# Patient Record
Sex: Male | Born: 1980 | Race: White | Hispanic: No | Marital: Single | Smoking: Current every day smoker
Health system: Southern US, Community
[De-identification: ages and names within clinical notes are randomized; demographics above are authoritative.]

## PROBLEM LIST (undated history)

## (undated) DIAGNOSIS — F1010 Alcohol abuse, uncomplicated: Secondary | ICD-10-CM

## (undated) DIAGNOSIS — F419 Anxiety disorder, unspecified: Secondary | ICD-10-CM

## (undated) DIAGNOSIS — E876 Hypokalemia: Secondary | ICD-10-CM

## (undated) DIAGNOSIS — I10 Essential (primary) hypertension: Secondary | ICD-10-CM

---

## 2013-04-04 ENCOUNTER — Ambulatory Visit: Payer: Self-pay | Admitting: General Surgery

## 2013-04-19 ENCOUNTER — Encounter: Payer: Self-pay | Admitting: *Deleted

## 2015-03-31 DIAGNOSIS — K644 Residual hemorrhoidal skin tags: Secondary | ICD-10-CM | POA: Insufficient documentation

## 2019-08-07 ENCOUNTER — Other Ambulatory Visit: Payer: Self-pay

## 2019-08-07 ENCOUNTER — Inpatient Hospital Stay
Admission: EM | Admit: 2019-08-07 | Discharge: 2019-08-09 | DRG: 093 | Disposition: A | Discharge status: Home / Self Care | Payer: BC Managed Care – PPO | Attending: Internal Medicine | Admitting: Internal Medicine

## 2019-08-07 ENCOUNTER — Emergency Department: Payer: BC Managed Care – PPO

## 2019-08-07 ENCOUNTER — Encounter: Payer: Self-pay | Admitting: *Deleted

## 2019-08-07 DIAGNOSIS — K7010 Alcoholic hepatitis without ascites: Secondary | ICD-10-CM | POA: Diagnosis present

## 2019-08-07 DIAGNOSIS — Z23 Encounter for immunization: Secondary | ICD-10-CM

## 2019-08-07 DIAGNOSIS — Z823 Family history of stroke: Secondary | ICD-10-CM

## 2019-08-07 DIAGNOSIS — R296 Repeated falls: Secondary | ICD-10-CM | POA: Diagnosis present

## 2019-08-07 DIAGNOSIS — Z807 Family history of other malignant neoplasms of lymphoid, hematopoietic and related tissues: Secondary | ICD-10-CM

## 2019-08-07 DIAGNOSIS — R748 Abnormal levels of other serum enzymes: Secondary | ICD-10-CM

## 2019-08-07 DIAGNOSIS — G723 Periodic paralysis: Secondary | ICD-10-CM | POA: Diagnosis not present

## 2019-08-07 DIAGNOSIS — Z20828 Contact with and (suspected) exposure to other viral communicable diseases: Secondary | ICD-10-CM | POA: Diagnosis present

## 2019-08-07 DIAGNOSIS — R531 Weakness: Secondary | ICD-10-CM

## 2019-08-07 DIAGNOSIS — F1020 Alcohol dependence, uncomplicated: Secondary | ICD-10-CM | POA: Diagnosis present

## 2019-08-07 DIAGNOSIS — F17210 Nicotine dependence, cigarettes, uncomplicated: Secondary | ICD-10-CM | POA: Diagnosis present

## 2019-08-07 DIAGNOSIS — I10 Essential (primary) hypertension: Secondary | ICD-10-CM | POA: Diagnosis present

## 2019-08-07 DIAGNOSIS — M5126 Other intervertebral disc displacement, lumbar region: Secondary | ICD-10-CM | POA: Diagnosis present

## 2019-08-07 DIAGNOSIS — M4802 Spinal stenosis, cervical region: Secondary | ICD-10-CM | POA: Diagnosis present

## 2019-08-07 DIAGNOSIS — K760 Fatty (change of) liver, not elsewhere classified: Secondary | ICD-10-CM | POA: Diagnosis present

## 2019-08-07 DIAGNOSIS — M48061 Spinal stenosis, lumbar region without neurogenic claudication: Secondary | ICD-10-CM | POA: Diagnosis present

## 2019-08-07 NOTE — ED Notes (Signed)
Pt states he is unable to walk after playing pickle ball 3 days ago.  Pt able to move legs and lift them without diff.  No headache  No back pain. Pt alert speech clear.  Denies injury to legs.  Pt reports when standing to walk, his legs give out.  Pt drinks etoh everyday.  cig smoker.  Rash to left lower abd.  No itching.  md at bedside on arrival

## 2019-08-07 NOTE — ED Provider Notes (Signed)
[facility] Emergency Department Provider Note   ____________________________________________   First MD Initiated Contact with Patient 08/07/19 2210     (approximate)  I have reviewed the triage vital signs and the nursing notes.   HISTORY  Chief Complaint Weakness    HPI Melvin Knight is a 38 y.o. male who reports that on Sunday he was playing pickleball which apparently is tennis on a half court then he sat down had a cocktail and then got up was leaving the club and in the parking lot his legs just collapsed and he could not walk any longer.  Friend of his got him in the car he was able to drive home but he has not been able to walk since then.  He has been sitting in a chair.  He called EMS today.  And came in.  He denies any fever chills back pain neck pain headache.  On testing he denies any numbness.  His legs are not weak.  He just says he cannot stand.  EMS backs him up with this.         No past medical history on file.  There are no active problems to display for this patient.    Prior to Admission medications   Not on File    Allergies Patient has no known allergies.  No family history on file.  Social History Social History   Tobacco Use  . Smoking status: Current Every Day Smoker  . Smokeless tobacco: Current User  Substance Use Topics  . Alcohol use: Yes  . Drug use: Not Currently    Review of Systems  Constitutional: No fever/chills Eyes: No visual changes. ENT: No sore throat. Cardiovascular: Denies chest pain. Respiratory: Denies shortness of breath. Gastrointestinal: No abdominal pain.  No nausea, no vomiting.  No diarrhea.  No constipation. Genitourinary: Negative for dysuria. Musculoskeletal: Negative for back pain. Skin: Negative for rash. Neurological: Negative for headaches ____________________________________________   PHYSICAL EXAM:  VITAL SIGNS: ED Triage Vitals  Enc Vitals Group     BP  08/07/19 2204 (!) 162/118     Pulse Rate 08/07/19 2204 98     Resp 08/07/19 2204 18     Temp 08/07/19 2204 99.3 F (37.4 C)     Temp Source 08/07/19 2204 Oral     SpO2 08/07/19 2204 99 %     Weight --      Height --      Head Circumference --      Peak Flow --      Pain Score 08/07/19 2206 0     Pain Loc --      Pain Edu? --      Excl. in [place]? --     Constitutional: Alert and oriented. Well appearing and in no acute distress. Eyes: Conjunctivae are normal.  PERRLA, EOMI Head: Atraumatic. Nose: No congestion/rhinnorhea. Mouth/Throat: Mucous membranes are moist.  Oropharynx non-erythematous. Neck: No stridor.   Cardiovascular: Normal rate, regular rhythm. Grossly normal heart sounds.  Good peripheral circulation. Respiratory: Normal respiratory effort.  No retractions. Lungs CTAB. Gastrointestinal: Soft and nontender. No distention. No abdominal bruits. No CVA tenderness. Musculoskeletal: No lower extremity tenderness nor edema.   Neurologic:  Normal speech and language. No gross focal neurologic deficits are appreciated.  Cranial nerves II through XII are intact of the visual fields were not checked.  Motor strength in the arms is 5/5 throughout patient denies any numbness motor strength in the legs is also 5/5  throughout and patient denies any numbness there as well.  Patient insists he cannot stand. Skin:  Skin is warm, dry and intact.  He has a slight rash in the lower abdomen on the left side.  It is red there are some 2 to 3 mm confluent papules.   ____________________________________________   LABS (all labs ordered are listed, but only abnormal results are displayed)  Labs Reviewed  CBC WITH DIFFERENTIAL/PLATELET  URINALYSIS, COMPLETE (UACMP) WITH MICROSCOPIC  CBC WITH DIFFERENTIAL/PLATELET  COMPREHENSIVE METABOLIC PANEL  TROPONIN I (HIGH SENSITIVITY)   ____________________________________________  EKG EKG read interpreted by me shows sinus tachycardia rate of 102  normal axis nonspecific ST-T wave changes  ____________________________________________  RADIOLOGY  ED MD interpretation:    Official radiology report(s): No results found.  ____________________________________________   PROCEDURES  Procedure(s) performed (including Critical Care):  Procedures   ____________________________________________   INITIAL IMPRESSION / ASSESSMENT AND PLAN / ED COURSE    Tomasita MorrowRobert Briles was evaluated in Emergency Department on 08/07/2019 for the symptoms described in the history of present illness. He was evaluated in the context of the global COVID-19 pandemic, which necessitated consideration that the patient might be at risk for infection with the SARS-CoV-2 virus that causes COVID-19. Institutional protocols and algorithms that pertain to the evaluation of patients at risk for COVID-19 are in a state of rapid change based on information released by regulatory bodies including the CDC and federal and state organizations. These policies and algorithms were followed during the patient's care in the ED.   ----------------------------------------- 11:38 PM on 08/07/2019 -----------------------------------------   Patient is still an MRI at this point.  I am signing him out to Dr. Lenard LancePaduchowski      ____________________________________________   FINAL CLINICAL IMPRESSION(S) / ED DIAGNOSES  Final diagnoses:  Weakness     ED Discharge Orders    None       Note:  This document was prepared using Dragon voice recognition software and may include unintentional dictation errors.    Arnaldo NatalMalinda, Solimar Maiden F, MD 08/07/19 [phone]

## 2019-08-07 NOTE — ED Notes (Signed)
Report off to vanessa rn 

## 2019-08-07 NOTE — ED Triage Notes (Signed)
Pt brought in via ems from home with weakness.  Pt reports unable to ambulate after playing pickle ball 3 days ago.  No known injury.  Pt alert  Speech clear.

## 2019-08-07 NOTE — ED Notes (Signed)
Pt placed on monitor by this writer, EKG completed and given to MD Malinda.

## 2019-08-08 ENCOUNTER — Inpatient Hospital Stay: Payer: BC Managed Care – PPO

## 2019-08-08 ENCOUNTER — Other Ambulatory Visit: Payer: Self-pay

## 2019-08-08 ENCOUNTER — Encounter: Payer: Self-pay | Admitting: *Deleted

## 2019-08-08 DIAGNOSIS — K760 Fatty (change of) liver, not elsewhere classified: Secondary | ICD-10-CM | POA: Diagnosis present

## 2019-08-08 DIAGNOSIS — F1020 Alcohol dependence, uncomplicated: Secondary | ICD-10-CM | POA: Diagnosis present

## 2019-08-08 DIAGNOSIS — M5126 Other intervertebral disc displacement, lumbar region: Secondary | ICD-10-CM | POA: Diagnosis present

## 2019-08-08 DIAGNOSIS — G723 Periodic paralysis: Secondary | ICD-10-CM | POA: Diagnosis present

## 2019-08-08 DIAGNOSIS — M4802 Spinal stenosis, cervical region: Secondary | ICD-10-CM | POA: Diagnosis present

## 2019-08-08 DIAGNOSIS — Z823 Family history of stroke: Secondary | ICD-10-CM | POA: Diagnosis not present

## 2019-08-08 DIAGNOSIS — M48061 Spinal stenosis, lumbar region without neurogenic claudication: Secondary | ICD-10-CM | POA: Diagnosis present

## 2019-08-08 DIAGNOSIS — F17210 Nicotine dependence, cigarettes, uncomplicated: Secondary | ICD-10-CM | POA: Diagnosis present

## 2019-08-08 DIAGNOSIS — Z23 Encounter for immunization: Secondary | ICD-10-CM | POA: Diagnosis not present

## 2019-08-08 DIAGNOSIS — K7010 Alcoholic hepatitis without ascites: Secondary | ICD-10-CM | POA: Diagnosis present

## 2019-08-08 DIAGNOSIS — Z20828 Contact with and (suspected) exposure to other viral communicable diseases: Secondary | ICD-10-CM | POA: Diagnosis present

## 2019-08-08 DIAGNOSIS — I10 Essential (primary) hypertension: Secondary | ICD-10-CM | POA: Diagnosis present

## 2019-08-08 DIAGNOSIS — Z807 Family history of other malignant neoplasms of lymphoid, hematopoietic and related tissues: Secondary | ICD-10-CM | POA: Diagnosis not present

## 2019-08-08 DIAGNOSIS — R296 Repeated falls: Secondary | ICD-10-CM | POA: Diagnosis present

## 2019-08-08 LAB — COMPREHENSIVE METABOLIC PANEL
ALT: 74 U/L — ABNORMAL HIGH (ref 0–44)
AST: 128 U/L — ABNORMAL HIGH (ref 15–41)
Albumin: 4 g/dL (ref 3.5–5.0)
Alkaline Phosphatase: 93 U/L (ref 38–126)
Anion gap: 12 (ref 5–15)
BUN: 8 mg/dL (ref 6–20)
CO2: 28 mmol/L (ref 22–32)
Calcium: 8.7 mg/dL — ABNORMAL LOW (ref 8.9–10.3)
Chloride: 97 mmol/L — ABNORMAL LOW (ref 98–111)
Creatinine, Ser: 0.65 mg/dL (ref 0.61–1.24)
GFR calc Af Amer: >60 mL/min (ref >60)
GFR calc non Af Amer: >60 mL/min (ref >60)
Glucose, Bld: 136 mg/dL — ABNORMAL HIGH (ref 70–99)
Potassium: <2 mmol/L — CL (ref 3.5–5.1)
Sodium: 137 mmol/L (ref 135–145)
Total Bilirubin: 2.2 mg/dL — ABNORMAL HIGH (ref 0.3–1.2)
Total Protein: 7.2 g/dL (ref 6.5–8.1)

## 2019-08-08 LAB — URINALYSIS, COMPLETE (UACMP) WITH MICROSCOPIC
Bacteria, UA: NONE SEEN
Bilirubin Urine: NEGATIVE
Glucose, UA: NEGATIVE mg/dL
Ketones, ur: NEGATIVE mg/dL
Leukocytes,Ua: NEGATIVE
Nitrite: NEGATIVE
Protein, ur: NEGATIVE mg/dL
Specific Gravity, Urine: 1.006 (ref 1.005–1.030)
Squamous Epithelial / HPF: NONE SEEN (ref 0–5)
pH: 7 (ref 5.0–8.0)

## 2019-08-08 LAB — CBC WITH DIFFERENTIAL/PLATELET
Abs Immature Granulocytes: 0.02 10*3/uL (ref 0.00–0.07)
Basophils Absolute: 0.1 10*3/uL (ref 0.0–0.1)
Basophils Relative: 1 %
Eosinophils Absolute: 0.2 10*3/uL (ref 0.0–0.5)
Eosinophils Relative: 2 %
HCT: 39.8 % (ref 39.0–52.0)
Hemoglobin: 15.1 g/dL (ref 13.0–17.0)
Immature Granulocytes: 0 %
Lymphocytes Relative: 27 %
Lymphs Abs: 2.5 10*3/uL (ref 0.7–4.0)
MCH: 36.4 pg — ABNORMAL HIGH (ref 26.0–34.0)
MCHC: 37.9 g/dL — ABNORMAL HIGH (ref 30.0–36.0)
MCV: 95.9 fL (ref 80.0–100.0)
Monocytes Absolute: 0.8 10*3/uL (ref 0.1–1.0)
Monocytes Relative: 9 %
Neutro Abs: 5.4 10*3/uL (ref 1.7–7.7)
Neutrophils Relative %: 61 %
Platelets: 226 10*3/uL (ref 150–400)
RBC: 4.15 MIL/uL — ABNORMAL LOW (ref 4.22–5.81)
RDW: 13.4 % (ref 11.5–15.5)
WBC: 9 10*3/uL (ref 4.0–10.5)
nRBC: 0 % (ref 0.0–0.2)

## 2019-08-08 LAB — HEPATIC FUNCTION PANEL
ALT: 70 U/L — ABNORMAL HIGH (ref 0–44)
AST: 114 U/L — ABNORMAL HIGH (ref 15–41)
Albumin: 3.9 g/dL (ref 3.5–5.0)
Alkaline Phosphatase: 91 U/L (ref 38–126)
Bilirubin, Direct: 0.7 mg/dL — ABNORMAL HIGH (ref 0.0–0.2)
Indirect Bilirubin: 1.7 mg/dL — ABNORMAL HIGH (ref 0.3–0.9)
Total Bilirubin: 2.4 mg/dL — ABNORMAL HIGH (ref 0.3–1.2)
Total Protein: 7 g/dL (ref 6.5–8.1)

## 2019-08-08 LAB — CBC
HCT: 38.5 % — ABNORMAL LOW (ref 39.0–52.0)
Hemoglobin: 14.6 g/dL (ref 13.0–17.0)
MCH: 36.6 pg — ABNORMAL HIGH (ref 26.0–34.0)
MCHC: 37.9 g/dL — ABNORMAL HIGH (ref 30.0–36.0)
MCV: 96.5 fL (ref 80.0–100.0)
Platelets: 226 10*3/uL (ref 150–400)
RBC: 3.99 MIL/uL — ABNORMAL LOW (ref 4.22–5.81)
RDW: 13.3 % (ref 11.5–15.5)
WBC: 8.8 10*3/uL (ref 4.0–10.5)
nRBC: 0 % (ref 0.0–0.2)

## 2019-08-08 LAB — TROPONIN I (HIGH SENSITIVITY)
Troponin I (High Sensitivity): 11 ng/L (ref <18)
Troponin I (High Sensitivity): 13 ng/L (ref <18)

## 2019-08-08 LAB — SARS CORONAVIRUS 2 (TAT 6-24 HRS): SARS Coronavirus 2: NEGATIVE

## 2019-08-08 LAB — SEDIMENTATION RATE: Sed Rate: 9 mm/hr (ref 0–15)

## 2019-08-08 LAB — BASIC METABOLIC PANEL
Anion gap: 12 (ref 5–15)
BUN: 8 mg/dL (ref 6–20)
CO2: 26 mmol/L (ref 22–32)
Calcium: 8.4 mg/dL — ABNORMAL LOW (ref 8.9–10.3)
Chloride: 101 mmol/L (ref 98–111)
Creatinine, Ser: 0.67 mg/dL (ref 0.61–1.24)
GFR calc Af Amer: >60 mL/min (ref >60)
GFR calc non Af Amer: >60 mL/min (ref >60)
Glucose, Bld: 138 mg/dL — ABNORMAL HIGH (ref 70–99)
Potassium: 2.5 mmol/L — CL (ref 3.5–5.1)
Sodium: 139 mmol/L (ref 135–145)

## 2019-08-08 LAB — MAGNESIUM: Magnesium: 1.9 mg/dL (ref 1.7–2.4)

## 2019-08-08 LAB — CKMB (ARMC ONLY): CK, MB: 4.7 ng/mL (ref 0.5–5.0)

## 2019-08-08 LAB — POTASSIUM: Potassium: 3 mmol/L — ABNORMAL LOW (ref 3.5–5.1)

## 2019-08-08 LAB — CK: Total CK: 838 U/L — ABNORMAL HIGH (ref 49–397)

## 2019-08-08 LAB — TSH: TSH: 1.559 u[IU]/mL (ref 0.350–4.500)

## 2019-08-08 MED ORDER — LABETALOL HCL 5 MG/ML IV SOLN
20.0000 mg | INTRAVENOUS | Status: DC | PRN
  Administered 2019-08-08 (×2): 20 mg via INTRAVENOUS
  Filled 2019-08-08 (×2): qty 4

## 2019-08-08 MED ORDER — THIAMINE HCL 100 MG/ML IJ SOLN
Freq: Once | INTRAVENOUS | Status: AC
  Administered 2019-08-08: 04:00:00 via INTRAVENOUS
  Filled 2019-08-08: qty 1000

## 2019-08-08 MED ORDER — AMLODIPINE BESYLATE 5 MG PO TABS
5.0000 mg | ORAL_TABLET | Freq: Every day | ORAL | Status: DC
  Administered 2019-08-08 – 2019-08-09 (×2): 5 mg via ORAL
  Filled 2019-08-08 (×2): qty 1

## 2019-08-08 MED ORDER — TRAZODONE HCL 50 MG PO TABS: 25.0000 mg | ORAL_TABLET | Freq: Every evening | ORAL | Status: DC | PRN

## 2019-08-08 MED ORDER — MAGNESIUM SULFATE IN D5W 1-5 GM/100ML-% IV SOLN
1.0000 g | Freq: Once | INTRAVENOUS | Status: AC
  Administered 2019-08-08: 1 g via INTRAVENOUS
  Filled 2019-08-08: qty 100

## 2019-08-08 MED ORDER — ACETAMINOPHEN 325 MG PO TABS: 650.0000 mg | ORAL_TABLET | Freq: Four times a day (QID) | ORAL | Status: DC | PRN

## 2019-08-08 MED ORDER — POTASSIUM CHLORIDE IN NACL 40-0.9 MEQ/L-% IV SOLN
INTRAVENOUS | Status: DC
  Administered 2019-08-08 – 2019-08-09 (×3): 100 mL/h via INTRAVENOUS
  Filled 2019-08-08 (×5): qty 1000

## 2019-08-08 MED ORDER — ONDANSETRON HCL 4 MG/2ML IJ SOLN: 4.0000 mg | Freq: Four times a day (QID) | INTRAMUSCULAR | Status: DC | PRN

## 2019-08-08 MED ORDER — ONDANSETRON HCL 4 MG PO TABS: 4.0000 mg | ORAL_TABLET | Freq: Four times a day (QID) | ORAL | Status: DC | PRN

## 2019-08-08 MED ORDER — LORAZEPAM 2 MG/ML IJ SOLN: 1.0000 mg | INTRAMUSCULAR | Status: DC | PRN

## 2019-08-08 MED ORDER — ACETAMINOPHEN 650 MG RE SUPP: 650.0000 mg | Freq: Four times a day (QID) | RECTAL | Status: DC | PRN

## 2019-08-08 MED ORDER — POTASSIUM CHLORIDE CRYS ER 20 MEQ PO TBCR
40.0000 meq | EXTENDED_RELEASE_TABLET | Freq: Once | ORAL | Status: AC
  Administered 2019-08-08: 01:00:00 40 meq via ORAL
  Filled 2019-08-08: qty 2

## 2019-08-08 MED ORDER — POTASSIUM CHLORIDE 20 MEQ PO PACK
40.0000 meq | PACK | Freq: Once | ORAL | Status: AC
  Administered 2019-08-08: 02:00:00 40 meq via ORAL
  Filled 2019-08-08: qty 2

## 2019-08-08 MED ORDER — NICOTINE 14 MG/24HR TD PT24: 14.0000 mg | MEDICATED_PATCH | Freq: Every day | TRANSDERMAL | Status: DC | PRN

## 2019-08-08 MED ORDER — POTASSIUM CHLORIDE CRYS ER 20 MEQ PO TBCR
40.0000 meq | EXTENDED_RELEASE_TABLET | Freq: Once | ORAL | Status: AC
  Administered 2019-08-08: 40 meq via ORAL
  Filled 2019-08-08: qty 2

## 2019-08-08 MED ORDER — POTASSIUM CHLORIDE 20 MEQ PO PACK
40.0000 meq | PACK | ORAL | Status: AC
  Administered 2019-08-08 (×3): 40 meq via ORAL
  Filled 2019-08-08 (×3): qty 2

## 2019-08-08 MED ORDER — INFLUENZA VAC SPLIT QUAD 0.5 ML IM SUSY
0.5000 mL | PREFILLED_SYRINGE | INTRAMUSCULAR | Status: AC
  Administered 2019-08-09: 0.5 mL via INTRAMUSCULAR
  Filled 2019-08-08: qty 0.5

## 2019-08-08 MED ORDER — MAGNESIUM HYDROXIDE 400 MG/5ML PO SUSP: 30.0000 mL | Freq: Every day | ORAL | Status: DC | PRN

## 2019-08-08 MED ORDER — POTASSIUM CHLORIDE 10 MEQ/100ML IV SOLN
10.0000 meq | Freq: Once | INTRAVENOUS | Status: AC
  Administered 2019-08-08: 10 meq via INTRAVENOUS
  Filled 2019-08-08: qty 100

## 2019-08-08 MED ORDER — ENOXAPARIN SODIUM 40 MG/0.4ML ~~LOC~~ SOLN
40.0000 mg | SUBCUTANEOUS | Status: DC
  Administered 2019-08-08 – 2019-08-09 (×2): 40 mg via SUBCUTANEOUS
  Filled 2019-08-08 (×2): qty 0.4

## 2019-08-08 NOTE — Progress Notes (Signed)
CRITICAL VALUE STICKER  CRITICAL VALUE: potassium 2.5  MD NOTIFIED:  Eugenie Norrie, MD  RESPONSE: orders for 90mEq of po potassium q2hr x 3 doses.  Conley Simmonds, RN, BSN

## 2019-08-08 NOTE — H&P (Signed)
[facility] [place] [facility]   PATIENT NAME: Melvin Knight    MR#:  [id number]  DATE OF BIRTH:  02-Sep-1981  DATE OF ADMISSION:  08/07/2019  PRIMARY CARE PHYSICIAN: Patient, No Pcp Per   REQUESTING/REFERRING PHYSICIAN: Minna Antis, MD CHIEF COMPLAINT:   Chief Complaint  Patient presents with  . Weakness    HISTORY OF PRESENT ILLNESS:  Melvin Knight  is a 38 y.o. Caucasian male with a known history of ongoing tobacco and EtOH abuse, who presented to the emergency room with acute onset of bilateral lower extremity weakness with subsequent recurrent falls.  The patient was playing pickle ball a couple days ago during the evening for about 3 hours and apparently did not realize exhausted himself more than usual.  He had a cocktail drank and then walked 100 yard distance prior to falling.  He was then helped to his car and fell again before getting into his car.  He was able to drive home.  At home he has been trying to use his hands and upper body to move but has not been able to ambulate.  He has therefore been feeling sore in his both arms and to lesser extent in his legs.  No other myalgia or arthralgia.  He stated that it has been in the same without improvement or deterioration over the last couple days and therefore he came to the ER.  Upon presentation here his blood pressure was 162/118 with a temperature of 99.3 with otherwise normal vital signs.  Labs were remarkable for hypokalemia of 2 and hypochloremia of 97.  AST use 128 and ALT 74.  He had a normal brain MRI and C-spine MRI showing cervical disc bulging and endplate spurring affecting the neural foramina and up to moderate foraminal stenosis at the right C7 nerve level with mild mostly left-sided foraminal stenosis elsewhere.  T and LS-spine MRI showed mild lower lumbar spine degeneration at L4-L5 and L5-S1 with borderline to mild multifactorial spinal stenosis, with small central disc protrusion at  the later without associated stenosis mild lumbar epidural lipomatosis and several small benign vertebral hemangiomas.  The patient was given  40 mEq p.o. and 10 mEq of IV potassium chloride.  He will be admitted to a medically monitored bed. PAST MEDICAL HISTORY:  Ongoing tobacco abuse and alcohol abuse  PAST SURGICAL HISTORY:  Denies any previous surgeries  SOCIAL HISTORY:   Social History   Tobacco Use  . Smoking status: Current Every Day Smoker  . Smokeless tobacco: Current User  Substance Use Topics  . Alcohol use: Yes  He smokes less than a half a pack of cigarettes per day and drinks 3 cocktails drinks per day.  FAMILY HISTORY:  Positive for CVA in his father and non-Hodgkin's lymphoma in his sister.  DRUG ALLERGIES:  No Known Allergies  REVIEW OF SYSTEMS:   ROS As per history of present illness. All pertinent systems were reviewed above. Constitutional,  HEENT, cardiovascular, respiratory, GI, GU, musculoskeletal, neuro, psychiatric, endocrine,  integumentary and hematologic systems were reviewed and are otherwise  negative/unremarkable except for positive findings mentioned above in the HPI.   MEDICATIONS AT HOME:   Prior to Admission medications   Not on File      VITAL SIGNS:  Blood pressure (!) 150/108, pulse 78, temperature 99.3 F (37.4 C), temperature source Oral, resp. rate (!) 22, SpO2 97 %.  PHYSICAL EXAMINATION:  Physical Exam  GENERAL:  38 y.o.-year-old Caucasian male patient lying  in the bed with no acute distress.  EYES: Pupils equal, round, reactive to light and accommodation. No scleral icterus. Extraocular muscles intact.  HEENT: Head atraumatic, normocephalic. Oropharynx and nasopharynx clear.  NECK:  Supple, no jugular venous distention. No thyroid enlargement, no tenderness.  LUNGS: Normal breath sounds bilaterally, no wheezing, rales,rhonchi or crepitation. No use of accessory muscles of respiration.  CARDIOVASCULAR: Regular rate and  rhythm, S1, S2 normal. No murmurs, rubs, or gallops.  ABDOMEN: Soft, nondistended, nontender. Bowel sounds present. No organomegaly or mass.  EXTREMITIES: No pedal edema, cyanosis, or clubbing.  NEUROLOGIC: Cranial nerves II through XII are intact. Muscle strength 5/5 in all extremities. Sensation intact. Gait not checked.  No pronator drift.  No facial droop. PSYCHIATRIC: The patient is alert and oriented x 3.  Normal affect and good eye contact. SKIN: No obvious rash, lesion, or ulcer.   LABORATORY PANEL:   CBC Recent Labs  Lab 08/08/19 0009  WBC 9.0  HGB 15.1  HCT 39.8  PLT 226   ------------------------------------------------------------------------------------------------------------------  Chemistries  Recent Labs  Lab 08/08/19 0009  NA 137  K <2.0*  CL 97*  CO2 28  GLUCOSE 136*  BUN 8  CREATININE 0.65  CALCIUM 8.7*  AST 128*  ALT 74*  ALKPHOS 93  BILITOT 2.2*   ------------------------------------------------------------------------------------------------------------------  Cardiac Enzymes No results for input(s): TROPONINI in the last 168 hours. ------------------------------------------------------------------------------------------------------------------  RADIOLOGY:  Mr Brain 13 Contrast  Result Date: 08/07/2019 CLINICAL DATA:  38 year old male with acute onset weakness, unable to stand, ambulate. EXAM: MRI HEAD WITHOUT CONTRAST TECHNIQUE: Multiplanar, multiecho pulse sequences of the brain and surrounding structures were obtained without intravenous contrast. COMPARISON:  None. FINDINGS: Brain: No restricted diffusion to suggest acute infarction. No midline shift, mass effect, evidence of mass lesion, ventriculomegaly, extra-axial collection or acute intracranial hemorrhage. Cervicomedullary junction and pituitary are within normal limits. Small cavum septum pellucidum, normal variant. Pearline Cables and white matter signal is within normal limits throughout the  brain. No encephalomalacia or chronic blood products identified. Vascular: Major intracranial vascular flow voids are preserved. Skull and upper cervical spine: Negative visible cervical spine, also reported separately today. Visualized bone marrow signal is within normal limits. Sinuses/Orbits: Negative orbits. Trace paranasal sinus mucosal thickening. Other: Mastoids are clear. Visible internal auditory structures appear normal. Scalp and face soft tissues appear negative. IMPRESSION: Normal noncontrast Normal MRI appearance of the brain. Electronically Signed   By: Genevie Ann M.D.   On: 08/07/2019 23:54   Mr Cervical Spine Wo Contrast  Result Date: 08/07/2019 CLINICAL DATA:  38 year old male with acute onset weakness, unable to stand, ambulate. EXAM: MRI CERVICAL SPINE WITHOUT CONTRAST TECHNIQUE: Multiplanar, multisequence MR imaging of the cervical spine was performed. No intravenous contrast was administered. COMPARISON:  Brain MRI today reported separately. FINDINGS: Alignment: Mild straightening of cervical lordosis. Vertebrae: No marrow edema or evidence of acute osseous abnormality. Visualized bone marrow signal is within normal limits. Cord: Normal spinal cord signal and morphology at all visible levels. Fairly capacious cervical spinal canal. Posterior Fossa, vertebral arteries, paraspinal tissues: Preserved major vascular flow voids in the neck. Codominant vertebral arteries. Cervicomedullary junction is within normal limits. Negative visible posterior fossa. Negative visible neck soft tissues and lung apices. Disc levels: C2-C3: Mild left foraminal endplate spurring and facet hypertrophy. Mild left C3 foraminal stenosis. C3-C4: Mild mostly foraminal disc bulging and endplate spurring. Mild left C4 foraminal stenosis. C4-C5: Mild foraminal disc bulging and endplate spurring with borderline to mild left C5 foraminal stenosis. C5-C6:  Negative. C6-C7: Mild foraminal disc bulging and endplate spurring  greater on the right. Mild to moderate right and mild left C7 foraminal stenosis. C7-T1:  Negative. Negative visible upper thoracic levels. IMPRESSION: 1. No cervical spinal stenosis and normal visible spinal cord. 2. Cervical disc bulging and endplate spurring affecting the neural foramina. Up to moderate foraminal stenosis at the right C7 nerve level. Mild mostly left side foraminal stenosis elsewhere. Electronically Signed   By: Odessa FlemingH  Hall M.D.   On: 08/07/2019 23:58   Mr Thoracic Spine Wo Contrast  Result Date: 08/08/2019 CLINICAL DATA:  38 year old male with acute onset weakness, unable to stand, ambulate. EXAM: MRI THORACIC SPINE WITHOUT CONTRAST TECHNIQUE: Multiplanar, multisequence MR imaging of the thoracic spine was performed. No intravenous contrast was administered. COMPARISON:  Cervical spine MRI today reported separately. FINDINGS: Limited cervical spine imaging:  Reported separately today. Thoracic spine segmentation:  Appears to be normal. Alignment:  Preserved thoracic kyphosis.  No spondylolisthesis. Vertebrae: No marrow edema or evidence of acute osseous abnormality. Mild degenerative thoracic endplate marrow signal changes, such as at T9 inferiorly on the left. Normal background bone marrow signal. Cord: Normal thoracic spinal cord signal and morphology to the conus medullaris which is at L1. Fairly capacious thoracic spinal canal and thecal sac. Paraspinal and other soft tissues: Negative. Disc levels: T1-T2: Negative. T2-T3: Negative. T3-T4: Negative. T4-T5: Negative. T5-T6: Tiny central disc protrusion most apparent on series 19, image 9. No spinal stenosis and no foraminal involvement. T6-T7: Negative. T7-T8: Small central to the left paracentral and slightly caudal disc protrusion best seen on series 22, image 24. No spinal stenosis and no foraminal involvement. T8-T9: Negative. T9-T10: Negative. T10-T11: Mild facet hypertrophy.  No stenosis. T11-T12: Minimal disc bulge. Mild facet  hypertrophy. No significant stenosis. T12-L1: Negative. IMPRESSION: 1. Largely normal for age MRI appearance of the thoracic spine. No spinal stenosis or spinal cord abnormality. 2. Tiny disc herniations at T5-T6 and T7-T8 without associated stenosis. Electronically Signed   By: Odessa FlemingH  Hall M.D.   On: 08/08/2019 00:08   Mr Lumbar Spine Wo Contrast  Result Date: 08/08/2019 CLINICAL DATA:  38 year old male with acute onset weakness, unable to stand, ambulate. EXAM: MRI LUMBAR SPINE WITHOUT CONTRAST TECHNIQUE: Multiplanar, multisequence MR imaging of the lumbar spine was performed. No intravenous contrast was administered. COMPARISON:  Thoracic spine MRI today reported separately. FINDINGS: Segmentation:  Normal. Alignment:  Preserved lumbar lordosis.  No spondylolisthesis. Vertebrae: Small benign sacral vertebral body hemangiomas are suspected at S1 and S2. Similar small left side L5 hemangioma. Background bone marrow signal is within normal limits. No marrow edema or evidence of acute osseous abnormality. Intact visible sacrum and SI joints. Conus medullaris and cauda equina: Conus extends to the L1 level. No lower spinal cord or conus signal abnormality. The cauda equina nerve roots appear normal. Paraspinal and other soft tissues: Negative. Disc levels: L1-L2:  Negative aside from mild epidural lipomatosis.  No stenosis. L2-L3:  Negative. L3-L4:  Negative. L4-L5: Mild epidural lipomatosis. Mild disc desiccation and disc bulging. Mild posterior element hypertrophy. Borderline to mild spinal stenosis. No convincing lateral recess or foraminal stenosis. L5-S1: Disc desiccation and disc space loss. Small central disc protrusion with annular fissure. Superimposed epidural lipomatosis, no spinal or lateral recess stenosis. Mild facet hypertrophy. No foraminal stenosis. IMPRESSION: 1. Mild lower lumbar spine degeneration at L4-L5 and L5-S1. Borderline to mild multifactorial spinal stenosis at the former. Small central  disc protrusion at the latter without associated stenosis. 2. Mild lumbar epidural lipomatosis.  3. No acute osseous abnormality. Several small benign vertebral hemangiomas. Electronically Signed   By: Odessa FlemingH  Hall M.D.   On: 08/08/2019 00:15      IMPRESSION AND PLAN:   1.  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 -The patient will be admitted to a medically monitored bed. -We will aggressively replace his potassium and obtain a magnesium level. -He will be hydrated with IV normal saline. -Differential diagnoses would include myositis and rhabdomyolysis. -Will obtain a CK and MB level.  2.  Ongoing alcohol abuse. -The patient will be placed on a banana bag daily and PRN IV Ativan. -No current signs of withdrawal. -I counseled him for significantly cutting down his alcohol intake.  3.  Elevated LFTs. -This likely related to alcoholic hepatitis. -Will obtain viral hepatitis markers.  4.  Elevated blood pressure. -This could be associated with new onset hypertension especially with history of alcohol abuse. - He will be placed on PRN IV labetalol. - We will add p.o. Norvasc.  5.  Ongoing tobacco abuse. -The patient was counseled for smoking cessation and he will receive further counseling here.  6.  DVT prophylaxis. -Subcutaneous Lovenox.  All the records are reviewed and case discussed with ED provider. The plan of care was discussed in details with the patient (and family). I answered all questions. The patient agreed to proceed with the above mentioned plan. Further management will depend upon hospital course.   CODE STATUS: Full code  TOTAL TIME TAKING CARE OF THIS PATIENT: 50 minutes.    Hannah BeatJan A Chrisandra Wiemers M.D on 08/08/2019 at 1:48 AM  Pager - [phone]  After 6pm go to [email or web address] - Social research officer, governmentpassword [email or web address] [email or web address]  [facility]  [facility]  Office  [phone]  CC: Primary care physician; Patient, No Pcp Per   Note: This dictation was prepared with Dragon dictation along with smaller phrase technology. Any transcriptional errors that result from this process are unintentional.

## 2019-08-08 NOTE — ED Provider Notes (Signed)
-----------------------------------------   1:03 AM on 08/08/2019 -----------------------------------------  Patient's repeat potassium remains critically low.  This very likely could be a case of hypokalemic periodic paralysis.  MRI scans are negative.  We will begin potassium repletion.  Patient will be admitted to the hospital service for further treatment.   Harvest Dark, MD 08/08/19 [phone]

## 2019-08-08 NOTE — Progress Notes (Signed)
Patient admitted early this morning.  Seen and examined by me later in the morning.  Please see H&P for further details.  Patient states he is feeling little bit better this morning.  He has no concerns today.  He is asking when he can go home.  On exam, he is well-appearing.  RRR, normal work of breathing on room air.  Generalized weakness-likely secondary to severe hypokalemia.  Potassium is improving with repletion. -Continue replacing potassium -Check K at 1 PM -Will give a dose of magnesium 1 g IV -PT eval  Mild rhabdomyolysis- CK 838 on admission.  Likely due to playing pickle ball for 3 hours without drinking any water. -Continue IV fluids for today -Recheck CK in the morning  Alcohol abuse-patient states he drinks about 6 drinks of liquor per day -Continue CIWA -Alcohol cessation counseling performed this admission  Elevated liver transaminases/hyperbilirubinemia-likely secondary to alcoholic hepatitis. -Hepatitis panel pending -RUQ ultrasound with hepatic steatosis and no gallbladder abnormalities. -Recheck CMP in the morning  Hypertension-BP improving -Norvasc started this admission -Continue IV labetalol as needed  Tobacco abuse -Nicotine patch as needed  Patient can likely be discharged home tomorrow.  Hyman Bible, MD

## 2019-08-08 NOTE — ED Notes (Signed)
.. ED TO INPATIENT HANDOFF REPORT  ED Nurse Name and Phone #: Delton Coombes  S Name/Age/Gender Melvin Knight 38 y.o. male Room/Bed: ED02A/ED02A  Code Status   Code Status: Full Code  Home/SNF/Other Home Patient oriented to: self, place, time and situation Is this baseline? Yes   Triage Complete: Triage complete  Chief Complaint Weakness  Triage Note Pt brought in via ems from home with weakness.  Pt reports unable to ambulate after playing pickle ball 3 days ago.  No known injury.  Pt alert  Speech clear.     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   A IV Location/Drains/Wounds Patient Lines/Drains/Airways Status   Active Line/Drains/Airways    Name:   Placement date:   Placement time:   Site:   Days:   Peripheral IV 08/07/19 Right Antecubital   08/07/19    2218    Antecubital   1          Intake/Output Last 24 hours No intake or output data in the 24 hours ending 08/08/19 0125  Labs/Imaging Results for orders placed or performed during the hospital encounter of 08/07/19 (from the past 48 hour(s))  Comprehensive metabolic panel     Status: Abnormal   Collection Time: 08/08/19 12:09 AM  Result Value Ref Range   Sodium 137 135 - 145 mmol/L   Potassium <2.0 (LL) 3.5 - 5.1 mmol/L    Comment:  CRITICAL RESULT CALLED TO, READ BACK BY AND VERIFIED WITH Sheriff Rodenberg ON 08/08/19 AT 0046 [facility]    Chloride 97 (L) 98 - 111 mmol/L   CO2 28 22 - 32 mmol/L   Glucose, Bld 136 (H) 70 - 99 mg/dL   BUN 8 6 - 20 mg/dL   Creatinine, Ser 0.65 0.61 - 1.24 mg/dL   Calcium 8.7 (L) 8.9 - 10.3 mg/dL   Total Protein 7.2 6.5 - 8.1 g/dL   Albumin 4.0 3.5 - 5.0 g/dL   AST 128 (H) 15 - 41 U/L   ALT 74 (H) 0 - 44 U/L   Alkaline Phosphatase 93 38 - 126 U/L   Total Bilirubin 2.2 (H) 0.3 - 1.2 mg/dL   GFR calc non Af Amer >60 >60 mL/min   GFR calc Af Amer >60 >60 mL/min   Anion gap 12 5 - 15    Comment: Performed at [facility], [place], [place] [address]  Troponin I (High Sensitivity)     Status: None   Collection Time: 08/08/19 12:09 AM  Result Value Ref Range   Troponin I (High Sensitivity) 13 <18 ng/L    Comment: (NOTE) Elevated high sensitivity troponin I (hsTnI) values and significant  changes across serial measurements may suggest ACS but many other  chronic and acute conditions are known to elevate hsTnI results.  Refer to the "Links" section for chest pain  algorithms and additional  guidance. Performed at [facility], [address] [place], [place] [id number]    Mr Brain 13Wo Contrast  Result Date: 08/07/2019 CLINICAL DATA:  38 year old male with acute onset weakness, unable to stand, ambulate. EXAM: MRI HEAD WITHOUT CONTRAST TECHNIQUE: Multiplanar, multiecho pulse sequences of the brain and surrounding structures were obtained without intravenous contrast. COMPARISON:  None. FINDINGS: Brain: No restricted diffusion to suggest acute infarction. No midline shift, mass effect, evidence of mass lesion, ventriculomegaly, extra-axial collection or acute intracranial hemorrhage. Cervicomedullary junction and pituitary are within normal limits. Small cavum septum pellucidum, normal variant. Wallace CullensGray and white matter signal is within normal limits throughout the brain. No  encephalomalacia or chronic blood products identified. Vascular: Major intracranial vascular flow voids are preserved. Skull and upper cervical spine: Negative visible cervical spine, also reported separately today. Visualized bone marrow signal is within normal limits. Sinuses/Orbits: Negative orbits. Trace paranasal sinus mucosal thickening. Other: Mastoids are clear. Visible internal auditory structures appear normal. Scalp and face soft tissues appear negative. IMPRESSION: Normal noncontrast Normal MRI appearance of the brain. Electronically Signed   By: Odessa FlemingH  Hall M.D.   On: 08/07/2019 23:54   Mr Cervical Spine Wo Contrast  Result Date: 08/07/2019 CLINICAL DATA:  38 year old male with acute onset weakness, unable to stand, ambulate. EXAM: MRI CERVICAL SPINE WITHOUT CONTRAST TECHNIQUE: Multiplanar, multisequence MR imaging of the cervical spine was performed. No intravenous contrast was administered. COMPARISON:  Brain MRI today reported separately. FINDINGS: Alignment: Mild straightening of cervical lordosis. Vertebrae: No marrow edema or evidence of acute osseous abnormality. Visualized bone marrow signal is within normal limits. Cord: Normal spinal cord signal and morphology at all visible levels. Fairly capacious cervical spinal canal. Posterior Fossa, vertebral arteries, paraspinal tissues: Preserved major vascular flow voids in the neck. Codominant vertebral arteries. Cervicomedullary junction is within normal limits. Negative visible posterior fossa. Negative visible neck soft tissues and lung apices. Disc levels: C2-C3: Mild left foraminal endplate spurring and facet hypertrophy. Mild left C3 foraminal stenosis. C3-C4: Mild mostly foraminal disc bulging and endplate spurring. Mild left C4 foraminal stenosis. C4-C5: Mild foraminal disc bulging and endplate spurring with borderline to mild left C5 foraminal stenosis. C5-C6:  Negative. C6-C7: Mild foraminal disc bulging and endplate spurring greater on the  right. Mild to moderate right and mild left C7 foraminal stenosis. C7-T1:  Negative. Negative visible upper thoracic levels. IMPRESSION: 1. No cervical spinal stenosis and normal visible spinal cord. 2. Cervical disc bulging and endplate spurring affecting the neural foramina. Up to moderate foraminal stenosis at the right C7 nerve level. Mild mostly left side foraminal stenosis elsewhere. Electronically Signed   By: Odessa FlemingH  Hall M.D.   On: 08/07/2019 23:58   Mr Thoracic Spine Wo Contrast  Result Date: 08/08/2019 CLINICAL DATA:  38 year old male with acute onset weakness, unable to stand, ambulate. EXAM: MRI THORACIC SPINE WITHOUT CONTRAST TECHNIQUE: Multiplanar, multisequence MR imaging of the thoracic spine was performed. No intravenous contrast was administered. COMPARISON:  Cervical spine MRI today reported separately. FINDINGS: Limited cervical spine imaging:  Reported separately today. Thoracic spine segmentation:  Appears to be normal. Alignment:  Preserved thoracic kyphosis.  No spondylolisthesis. Vertebrae: No marrow edema or evidence of acute osseous abnormality. Mild degenerative thoracic endplate marrow signal changes, such as at T9 inferiorly on the left. Normal background bone marrow signal. Cord: Normal thoracic spinal cord signal and morphology to the conus medullaris which is at L1. Fairly capacious thoracic spinal canal and thecal sac. Paraspinal and other  soft tissues: Negative. Disc levels: T1-T2: Negative. T2-T3: Negative. T3-T4: Negative. T4-T5: Negative. T5-T6: Tiny central disc protrusion most apparent on series 19, image 9. No spinal stenosis and no foraminal involvement. T6-T7: Negative. T7-T8: Small central to the left paracentral and slightly caudal disc protrusion best seen on series 22, image 24. No spinal stenosis and no foraminal involvement. T8-T9: Negative. T9-T10: Negative. T10-T11: Mild facet hypertrophy.  No stenosis. T11-T12: Minimal disc bulge. Mild facet hypertrophy. No  significant stenosis. T12-L1: Negative. IMPRESSION: 1. Largely normal for age MRI appearance of the thoracic spine. No spinal stenosis or spinal cord abnormality. 2. Tiny disc herniations at T5-T6 and T7-T8 without associated stenosis. Electronically Signed   By: Odessa Fleming M.D.   On: 08/08/2019 00:08   Mr Lumbar Spine Wo Contrast  Result Date: 08/08/2019 CLINICAL DATA:  38 year old male with acute onset weakness, unable to stand, ambulate. EXAM: MRI LUMBAR SPINE WITHOUT CONTRAST TECHNIQUE: Multiplanar, multisequence MR imaging of the lumbar spine was performed. No intravenous contrast was administered. COMPARISON:  Thoracic spine MRI today reported separately. FINDINGS: Segmentation:  Normal. Alignment:  Preserved lumbar lordosis.  No spondylolisthesis. Vertebrae: Small benign sacral vertebral body hemangiomas are suspected at S1 and S2. Similar small left side L5 hemangioma. Background bone marrow signal is within normal limits. No marrow edema or evidence of acute osseous abnormality. Intact visible sacrum and SI joints. Conus medullaris and cauda equina: Conus extends to the L1 level. No lower spinal cord or conus signal abnormality. The cauda equina nerve roots appear normal. Paraspinal and other soft tissues: Negative. Disc levels: L1-L2:  Negative aside from mild epidural lipomatosis.  No stenosis. L2-L3:  Negative. L3-L4:  Negative. L4-L5: Mild epidural lipomatosis. Mild disc desiccation and disc bulging. Mild posterior element hypertrophy. Borderline to mild spinal stenosis. No convincing lateral recess or foraminal stenosis. L5-S1: Disc desiccation and disc space loss. Small central disc protrusion with annular fissure. Superimposed epidural lipomatosis, no spinal or lateral recess stenosis. Mild facet hypertrophy. No foraminal stenosis. IMPRESSION: 1. Mild lower lumbar spine degeneration at L4-L5 and L5-S1. Borderline to mild multifactorial spinal stenosis at the former. Small central disc protrusion at  the latter without associated stenosis. 2. Mild lumbar epidural lipomatosis. 3. No acute osseous abnormality. Several small benign vertebral hemangiomas. Electronically Signed   By: Odessa Fleming M.D.   On: 08/08/2019 00:15    Pending Labs Unresulted Labs (From admission, onward)    Start     Ordered   08/08/19 0500  Basic metabolic panel  Tomorrow morning,   STAT     08/08/19 0121   08/08/19 0500  CBC  Tomorrow morning,   STAT     08/08/19 0121   08/08/19 0123  Magnesium  Add-on,   AD    Comments: Follow-up was in 2: Give 2 g of IV mag sulfate    08/08/19 0122   08/08/19 0122  HIV antibody (Routine Testing)  Once,   STAT     08/08/19 0121   08/08/19 0058  SARS CORONAVIRUS 2 (TAT 6-24 HRS) Nasopharyngeal Nasopharyngeal Swab  (Asymptomatic/Tier 2 Patients Labs)  Once,   STAT    Question Answer Comment  Is this test for diagnosis or screening Screening   Symptomatic for COVID-19 as defined by CDC No   Hospitalized for COVID-19 No   Admitted to ICU for COVID-19 No   Previously tested for COVID-19 No   Resident in a congregate (group) care setting No   Employed in healthcare setting No  08/08/19 0057   08/08/19 0053  TSH  Add-on,   AD     08/08/19 0053   08/07/19 2320  CBC with Differential/Platelet  Once,   STAT     08/07/19 2320   08/07/19 2211  CBC with Differential  Once,   STAT     08/07/19 2213   08/07/19 2211  Urinalysis, Complete w Microscopic  ONCE - STAT,   STAT     08/07/19 2213          Vitals/Pain Today's Vitals   08/07/19 2204 08/07/19 2206 08/07/19 2230  BP: (!) 162/118  (!) 170/119  Pulse: 98  95  Resp: 18  18  Temp: 99.3 F (37.4 C)    TempSrc: Oral    SpO2: 99%  96%  PainSc:  0-No pain     Isolation Precautions No active isolations  Medications Medications  potassium chloride 10 mEq in 100 mL IVPB (10 mEq Intravenous New Bag/Given 08/08/19 0109)  enoxaparin (LOVENOX) injection 40 mg (has no administration in time range)  0.9 % NaCl with KCl 40  mEq / L  infusion (has no administration in time range)  acetaminophen (TYLENOL) tablet 650 mg (has no administration in time range)    Or  acetaminophen (TYLENOL) suppository 650 mg (has no administration in time range)  traZODone (DESYREL) tablet 25 mg (has no administration in time range)  ondansetron (ZOFRAN) tablet 4 mg (has no administration in time range)    Or  ondansetron (ZOFRAN) injection 4 mg (has no administration in time range)  magnesium hydroxide (MILK OF MAGNESIA) suspension 30 mL (has no administration in time range)  potassium chloride (KLOR-CON) packet 40 mEq (has no administration in time range)  potassium chloride SA (K-DUR) CR tablet 40 mEq (40 mEq Oral Given 08/08/19 0057)    Mobility non-ambulatory Moderate fall risk   Focused Assessments Neuro Assessment Handoff:  Swallow screen pass? Yes          Neuro Assessment:   Neuro Checks:      Last Documented NIHSS Modified Score:   Has TPA been given? No If patient is a Neuro Trauma and patient is going to OR before floor call report to 4N Charge nurse: [phone] or [phone]     R Recommendations: See Admitting Provider Note  Report given to:   Additional Notes:

## 2019-08-08 NOTE — Evaluation (Signed)
Physical Therapy Evaluation Patient Details Name: Melvin Knight MRN: [id number] DOB: 08/31/1981 Today's Date: 08/08/2019   History of Present Illness  Pt is a 38 year old male admitted for hypokalemia following collapse and paralysis at home.  No medical hx on file.  Clinical Impression  Pt is a 38 year old male who lives in a one story home alone.  He is independent without an AD and works full time at baseline.  Pt able to perform bed mobility mod I with no difficulty sitting at EOB.  Pt with full strength in ankles and knee but still very limited with hip flexion and UE mobility.  Pt able to stand from bedside appearing slightly unsteady but without need for UE support.  He presented with balance deficits during functional dynamic tasks and 40 ft of ambulation in room.  He felt more comfortable with use of RW during ambulation and was able to manage appropriately.  PT reviewed there ex which will help pt with UE mobility and pt was able to complete all with manual assistance.  Pt reported pain at IV site following shoulder depression there ex and RN notified.  Pt will continue to benefit from skilled PT with focus on strength, tolerance to activity and safe functional mobility.    Follow Up Recommendations Home health PT;Supervision - Intermittent    Equipment Recommendations  Rolling walker with 5" wheels;3in1 (PT)    Recommendations for Other Services       Precautions / Restrictions Precautions Precautions: Fall      Mobility  Bed Mobility Overal bed mobility: Modified Independent             General bed mobility comments: Increased time  Transfers Overall transfer level: Needs assistance Equipment used: Rolling walker (2 wheeled) Transfers: Sit to/from Stand Sit to Stand: Min guard         General transfer comment: Able to stand without use of UE's or RW.  Able to stand with good time and slightly unsteady once standing without symptoms of  hypotension.  Ambulation/Gait Ambulation/Gait assistance: Min guard Gait Distance (Feet): 40 Feet Assistive device: Rolling walker (2 wheeled)     Gait velocity interpretation: <1.8 ft/sec, indicate of risk for recurrent falls General Gait Details: Fair foot clearance, step length, able to manage RW but does not use for physical support most of time.  Stated that he feels more comfortable with it at this time.  Stairs            Wheelchair Mobility    Modified Rankin (Stroke Patients Only)       Balance Overall balance assessment: Needs assistance Sitting-balance support: Feet supported Sitting balance-Leahy Scale: Good     Standing balance support: Bilateral upper extremity supported Standing balance-Leahy Scale: Fair Standing balance comment: Able to stand without RW, more steady and confident with RW during dynamic tasks. Single Leg Stance - Right Leg: 7 Single Leg Stance - Left Leg: 7 Tandem Stance - Right Leg: 10 Tandem Stance - Left Leg: 10 Rhomberg - Eyes Opened: 10 Rhomberg - Eyes Closed: 10   High Level Balance Comments: Able to perform standing march with full ROM, unable to bend to lift object from floor at this time.             Pertinent Vitals/Pain Pain Assessment: No/denies pain    Home Living Family/patient expects to be discharged to:: Private residence Living Arrangements: Alone Available Help at Discharge: Family;Available PRN/intermittently Type of Home: House Home Access: Stairs to  enter Entrance Stairs-Rails: None(Very small steps, can hold to door.) Entrance Stairs-Number of Steps: 2 Home Layout: One level Home Equipment: None      Prior Function Level of Independence: Independent         Comments: Works full time.     Hand Dominance        Extremity/Trunk Assessment   Upper Extremity Assessment Upper Extremity Assessment: Generalized weakness(Shoulder AROM: 30 degrees bilaterally, PROM: 110 degrees with guarding.   Grip 4-/5 bilaterally.)    Lower Extremity Assessment Lower Extremity Assessment: Overall WFL for tasks assessed(Ankle DF/PF, knee ext/flex: 5/5 bilaterally, Hip flexion: 3-/5 bilaterally.)    Cervical / Trunk Assessment Cervical / Trunk Assessment: Normal  Communication   Communication: No difficulties  Cognition Arousal/Alertness: Awake/alert Behavior During Therapy: WFL for tasks assessed/performed Overall Cognitive Status: Within Functional Limits for tasks assessed                                 General Comments: Eager to work with therapy and follows directions consistently.      General Comments      Exercises Other Exercises Other Exercises: Shoulder PROM x10 BUE, shoulder AROM x10 BUE, shoulder depression x10 in recliner.   Assessment/Plan    PT Assessment Patient needs continued PT services  PT Problem List Decreased strength;Decreased mobility;Decreased activity tolerance;Decreased balance;Decreased knowledge of use of DME;Decreased coordination       PT Treatment Interventions DME instruction;Therapeutic activities;Gait training;Therapeutic exercise;Patient/family education;Stair training;Balance training;Functional mobility training;Neuromuscular re-education    PT Goals (Current goals can be found in the Care Plan section)  Acute Rehab PT Goals Patient Stated Goal: To return to work PT Goal Formulation: With patient Time For Goal Achievement: 08/22/19 Potential to Achieve Goals: Good    Frequency Min 2X/week   Barriers to discharge        Co-evaluation               AM-PAC PT "6 Clicks" Mobility  Outcome Measure Help needed turning from your back to your side while in a flat bed without using bedrails?: A Little Help needed moving from lying on your back to sitting on the side of a flat bed without using bedrails?: A Little Help needed moving to and from a bed to a chair (including a wheelchair)?: A Little Help needed standing  up from a chair using your arms (e.g., wheelchair or bedside chair)?: A Little Help needed to walk in hospital room?: A Little Help needed climbing 3-5 steps with a railing? : A Lot 6 Click Score: 17    End of Session Equipment Utilized During Treatment: Gait belt Activity Tolerance: Patient tolerated treatment well;Patient limited by fatigue Patient left: in chair;with call bell/phone within reach Nurse Communication: Mobility status(Pain at site of IV reported following shoulder depression exercises.) PT Visit Diagnosis: Unsteadiness on feet (R26.81);Difficulty in walking, not elsewhere classified (R26.2);Muscle weakness (generalized) (M62.81)    Time: [id number] PT Time Calculation (min) (ACUTE ONLY): 28 min   Charges:   PT Evaluation $PT Eval Low Complexity: 1 Low PT Treatments $Therapeutic Activity: 8-22 mins        Glenetta HewSarah Vladislav Axelson, PT, DPT   Glenetta HewSarah Aldred Mase 08/08/2019, 4:21 PM

## 2019-08-09 LAB — CBC
HCT: 35.4 % — ABNORMAL LOW (ref 39.0–52.0)
Hemoglobin: 12.8 g/dL — ABNORMAL LOW (ref 13.0–17.0)
MCH: 37 pg — ABNORMAL HIGH (ref 26.0–34.0)
MCHC: 36.2 g/dL — ABNORMAL HIGH (ref 30.0–36.0)
MCV: 102.3 fL — ABNORMAL HIGH (ref 80.0–100.0)
Platelets: 192 10*3/uL (ref 150–400)
RBC: 3.46 MIL/uL — ABNORMAL LOW (ref 4.22–5.81)
RDW: 14 % (ref 11.5–15.5)
WBC: 6.4 10*3/uL (ref 4.0–10.5)
nRBC: 0 % (ref 0.0–0.2)

## 2019-08-09 LAB — BASIC METABOLIC PANEL
Anion gap: 8 (ref 5–15)
BUN: <5 mg/dL — ABNORMAL LOW (ref 6–20)
CO2: 27 mmol/L (ref 22–32)
Calcium: 8.3 mg/dL — ABNORMAL LOW (ref 8.9–10.3)
Chloride: 109 mmol/L (ref 98–111)
Creatinine, Ser: 0.56 mg/dL — ABNORMAL LOW (ref 0.61–1.24)
GFR calc Af Amer: >60 mL/min (ref >60)
GFR calc non Af Amer: >60 mL/min (ref >60)
Glucose, Bld: 135 mg/dL — ABNORMAL HIGH (ref 70–99)
Potassium: 3.7 mmol/L (ref 3.5–5.1)
Sodium: 144 mmol/L (ref 135–145)

## 2019-08-09 LAB — CK
Total CK: 1802 U/L — ABNORMAL HIGH (ref 49–397)
Total CK: 2254 U/L — ABNORMAL HIGH (ref 49–397)
Total CK: 2361 U/L — ABNORMAL HIGH (ref 49–397)
Total CK: 2453 U/L — ABNORMAL HIGH (ref 49–397)

## 2019-08-09 LAB — HEPATIC FUNCTION PANEL
ALT: 63 U/L — ABNORMAL HIGH (ref 0–44)
AST: 112 U/L — ABNORMAL HIGH (ref 15–41)
Albumin: 3.5 g/dL (ref 3.5–5.0)
Alkaline Phosphatase: 80 U/L (ref 38–126)
Bilirubin, Direct: 0.3 mg/dL — ABNORMAL HIGH (ref 0.0–0.2)
Indirect Bilirubin: 0.8 mg/dL (ref 0.3–0.9)
Total Bilirubin: 1.1 mg/dL (ref 0.3–1.2)
Total Protein: 6.4 g/dL — ABNORMAL LOW (ref 6.5–8.1)

## 2019-08-09 LAB — HIV ANTIBODY (ROUTINE TESTING W REFLEX): HIV Screen 4th Generation wRfx: NONREACTIVE

## 2019-08-09 LAB — HEPATITIS PANEL, ACUTE
HCV Ab: <0.1 s/co ratio (ref 0.0–0.9)
Hep A IgM: NEGATIVE
Hep B C IgM: NEGATIVE
Hepatitis B Surface Ag: NEGATIVE

## 2019-08-09 MED ORDER — SODIUM CHLORIDE 0.9 % IV BOLUS
1000.0000 mL | Freq: Once | INTRAVENOUS | Status: AC
  Administered 2019-08-09: 13:00:00 1000 mL via INTRAVENOUS

## 2019-08-09 MED ORDER — SODIUM CHLORIDE 0.9 % IV BOLUS
1000.0000 mL | Freq: Once | INTRAVENOUS | Status: AC
  Administered 2019-08-09: 1000 mL via INTRAVENOUS

## 2019-08-09 MED ORDER — AMLODIPINE BESYLATE 5 MG PO TABS: 5.0000 mg | ORAL_TABLET | Freq: Every day | ORAL | 0 refills | Status: DC

## 2019-08-09 NOTE — Progress Notes (Signed)
Patient CK elevated post 2 IVF bolus's.  Dr. Brett Albino aware. Patient plan is to go home today and encouraged to drink lots of fluids in the next few days to stay hydrated.

## 2019-08-09 NOTE — Progress Notes (Signed)
Patient ambulating in room this am without assistance. Patient fall risk due to dehydration and unable to get up prior to admission. Dr. Brett Albino in agreement that patient improved at this time and able to be independent in room.  Plan to be discharged later today.

## 2019-08-09 NOTE — Discharge Instructions (Signed)
It was so nice to meet you during this hospitalization!  You came into the hospital with weakness and we found that your potassium was very low. You also had some muscle injury that caused some toxins to be released into your blood. We gave you fluids and potassium and everything improved.  Your blood pressure was high throughout this whole hospitalization, so I started you on a blood pressure medication called Norvasc. Please take 1 tablet daily. You should follow-up with your primary care doctor.   Take care, Dr. Brett Albino

## 2019-08-09 NOTE — Progress Notes (Signed)
OT Cancellation Note  Patient Details Name: Melvin Knight MRN: [id number] DOB: [birth year]   Cancelled Treatment:    Reason Eval/Treat Not Completed: OT screened, no needs identified, will sign off(OT services are not warranted at this time. Pt. reports that UE impairments initially present upon admission have resolved, and that he is back at his baseline functioning. There are currently no deficits warranting OT intervention.)  Harrel Carina, MS, OTR/L 08/09/2019, 3:55 PM

## 2019-08-09 NOTE — Care Management (Signed)
Per Dr. Brett Albino patient is doing much better today and does not have any home needs.  TOC signing off.

## 2019-08-09 NOTE — Discharge Summary (Signed)
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 No pertinent past medical history. HOSPITAL COURSE:   Melvin MaduroRobert "Melvin Castillandrew" is a 38 year old male who presented to the ED with generalized weakness and recurrent falls.  He had played pickle ball for about 3 hours a couple days before that and has had progressively worsening weakness since then.  In the ED, potassium was <2.0.  He was admitted for further management.  Generalized weakness-likely secondary to severe hypokalemia.  Hypokalemia resolved with repletion. -MRI brain was normal -MRI c-spine showed some cervical disc bulging -MRI t-spine nromal -MRI l-spine some borderline spinal stenosis -PT was consulted and recommended home health PT and a rolling walker, but patient's weakness completely resolved on the day of discharge and patient was able to ambulate around the room without any issues.  Mild rhabdomyolysis- Likely due to playing pickle ball for 3 hours without drinking any water.  -CK initially trended upwards, but plateaued with IV fluids  Alcohol abuse-patient states he drinks about 6 drinks of liquor per day -Alcohol cessation counseling performed this admission  Elevated liver transaminases/hyperbilirubinemia-likely secondary to alcoholic hepatitis. -Acute hepatitis panel was negative -RUQ ultrasound with hepatic steatosis and no gallbladder abnormalities. -Recheck CMP as an outpatient  Hypertension- BP was elevated throughout this admission -Started on Norvasc 5 mg daily -Needs close BP  monitoring as an outpatient  Tobacco abuse -Tobacco cessation counseling performed this admission  DISCHARGE CONDITIONS:  Mild rhabdomyolysis Alcohol abuse Alcoholic hepatitis Hypertension Tobacco abuse CONSULTS OBTAINED:  None DRUG ALLERGIES:  No Known Allergies DISCHARGE MEDICATIONS:   Allergies as of 08/09/2019   No Known Allergies     Medication List    TAKE these medications   amLODipine 5 MG tablet Commonly known as: NORVASC Take 1 tablet (5 mg total) by mouth daily. Start taking on: August 10, 2019        DISCHARGE INSTRUCTIONS:  1.  Follow-up with PCP in 5 days 2.  Take Norvasc 5 mg daily DIET:  Cardiac diet DISCHARGE CONDITION:  Stable ACTIVITY:  Activity as tolerated OXYGEN:  Home Oxygen: No.  Oxygen Delivery: room air DISCHARGE LOCATION:  home   If you experience worsening of your admission symptoms, develop shortness of breath, life threatening emergency, suicidal or homicidal thoughts you must seek medical attention immediately by calling 911 or calling your MD immediately  if symptoms less severe.  You Must read complete instructions/literature along with all the possible adverse reactions/side effects for all the Medicines you take and that have been prescribed to you. Take any new Medicines after you have completely understood and accpet all the possible adverse reactions/side effects.   Please note  You were cared for by a hospitalist during your hospital stay. If you have any questions about your discharge medications or the care you received while you were in the hospital after you are discharged, you can call the unit and asked to speak with the hospitalist on call if the hospitalist that took care of you is not available. Once you are discharged, your primary care physician will handle any  further medical issues. Please note that NO REFILLS for any discharge medications will be authorized once you are discharged, as it is imperative that  you return to your primary care physician (or establish a relationship with a primary care physician if you do not have one) for your aftercare needs so that they can reassess your need for medications and monitor your lab values.    On the day of Discharge:  VITAL SIGNS:  Blood pressure (!) 159/112, pulse 82, temperature 98.9 F (37.2 C), temperature source Oral, resp. rate 19, height 5\' 7"  (1.702 m), weight 93.2 kg, SpO2 99 %. PHYSICAL EXAMINATION:  GENERAL:  38 y.o.-year-old patient, ambulating throughout the room without any issues.  Well-appearing. EYES: Pupils equal, round, reactive to light and accommodation. No scleral icterus. Extraocular muscles intact.  HEENT: Head atraumatic, normocephalic. Oropharynx and nasopharynx clear.  NECK:  Supple, no jugular venous distention. No thyroid enlargement, no tenderness.  LUNGS: Normal breath sounds bilaterally, no wheezing, rales,rhonchi or crepitation. No use of accessory muscles of respiration.  CARDIOVASCULAR: RRR, S1, S2 normal. No murmurs, rubs, or gallops.  ABDOMEN: Soft, non-tender, non-distended. Bowel sounds present. No organomegaly or mass.  EXTREMITIES: No pedal edema, cyanosis, or clubbing.  NEUROLOGIC: Cranial nerves II through XII are intact. Muscle strength 5/5 in all extremities. Sensation intact. Gait not checked.  PSYCHIATRIC: The patient is alert and oriented x 3.  SKIN: No obvious rash, lesion, or ulcer.  DATA REVIEW:   CBC Recent Labs  Lab 08/09/19 0451  WBC 6.4  HGB 12.8*  HCT 35.4*  PLT 192    Chemistries  Recent Labs  Lab 08/08/19 0325  08/09/19 0451  NA 139  --  144  K 2.5*   < > 3.7  CL 101  --  109  CO2 26  --  27  GLUCOSE 138*  --  135*  BUN 8  --  <5*  CREATININE 0.67  --  0.56*  CALCIUM 8.4*  --  8.3*  MG 1.9  --   --   AST 114*  --  112*  ALT 70*  --  63*  ALKPHOS 91  --  80  BILITOT 2.4*  --  1.1   < > = values in this interval not displayed.     Microbiology Results  Results for  orders placed or performed during the hospital encounter of 08/07/19  SARS CORONAVIRUS 2 (TAT 6-24 HRS) Nasopharyngeal Nasopharyngeal Swab     Status: None   Collection Time: 08/08/19  1:00 AM   Specimen: Nasopharyngeal Swab  Result Value Ref Range Status   SARS Coronavirus 2 NEGATIVE NEGATIVE Final    Comment: (NOTE) SARS-CoV-2 target nucleic acids are NOT DETECTED. The SARS-CoV-2 RNA is generally detectable in upper and lower respiratory specimens during the acute phase of infection. Negative results do not preclude SARS-CoV-2 infection, do not rule out co-infections with other pathogens, and should not be used as the sole basis for treatment or other patient management decisions. Negative results must be combined with clinical observations, patient history, and epidemiological information. The expected result is Negative. Fact Sheet for Patients: HairSlick.no Fact Sheet for Healthcare Providers: quierodirigir.com This test is not yet approved or cleared by the [place] FDA and  has been authorized for detection and/or diagnosis of SARS-CoV-2 by FDA under an Emergency Use Authorization (EUA). This EUA will remain  in effect (meaning this test can be used) for the duration of the COVID-19 declaration under Section 56 4(b)(1) of the  Act, 21 U.S.C. section 360bbb-3(b)(1), unless the authorization is terminated or revoked sooner. Performed at [facility], [place] [address], [place], [place] [address]     RADIOLOGY:  No results found.   Management plans discussed with the patient, family and they are in agreement.  CODE STATUS: Full Code   TOTAL TIME TAKING CARE OF THIS PATIENT: 45 minutes.    Berna Spare Mayo M.D on 08/09/2019 at 10:22 AM  Between 7am to 6pm - Pager - [phone]  After 6pm go to [email or web address] - Proofreader  [employer]  Hospitalists  Office  [phone]  CC: Primary care  physician; Patient, No Pcp Per   Note: This dictation was prepared with Dragon dictation along with smaller phrase technology. Any transcriptional errors that result from this process are unintentional.

## 2019-08-09 NOTE — Plan of Care (Signed)
  Problem: Activity: Goal: Risk for activity intolerance will decrease Outcome: Progressing   Problem: Elimination: Goal: Will not experience complications related to bowel motility Outcome: Progressing Goal: Will not experience complications related to urinary retention Outcome: Progressing   Problem: Pain Managment: Goal: General experience of comfort will improve Outcome: Progressing   Problem: Safety: Goal: Ability to remain free from injury will improve Outcome: Progressing   Problem: Skin Integrity: Goal: Risk for impaired skin integrity will decrease Outcome: Progressing   Problem: Education: Goal: Knowledge of General Education information will improve Description: Including pain rating scale, medication(s)/side effects and non-pharmacologic comfort measures Outcome: Completed/Met   Problem: Clinical Measurements: Goal: Respiratory complications will improve Outcome: Completed/Met   Problem: Nutrition: Goal: Adequate nutrition will be maintained Outcome: Completed/Met   Problem: Coping: Goal: Level of anxiety will decrease Outcome: Completed/Met

## 2019-08-09 NOTE — Progress Notes (Signed)
Patient discharged to home. Tele and IV d/c'd prior to discharge. Patient verbalizes understanding of discharge instructions.  Patient received flu vaccine prior to going home.

## 2020-08-25 IMAGING — MR MR THORACIC SPINE W/O CM
6 series · 34 of 48 positions shown · non-contrast
Comparison: Cervical spine MRI today reported separately.

CLINICAL DATA: 37-year-old male with acute onset weakness, unable
to stand, ambulate.

EXAM:
MRI THORACIC SPINE WITHOUT CONTRAST
TECHNIQUE: Multiplanar, multisequence MR imaging of the thoracic spine was
performed. No intravenous contrast was administered.

[Series 18: T1 · sagittal · 6.0mm · 1.41mm/px · 2 of 9 slices shown (1 of 2)]
[im 1/9]
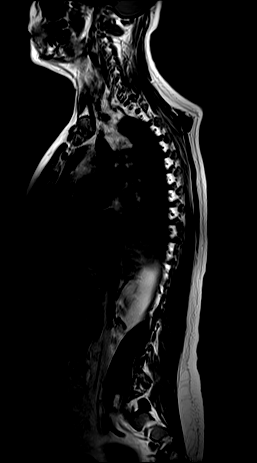
[im 9/9]
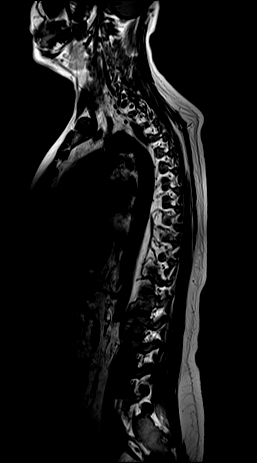

[Series 19: T2 · sagittal · 3.0mm · 1.33mm/px · 6 of 17 slices shown (1 of 2)]
[im 1/17]
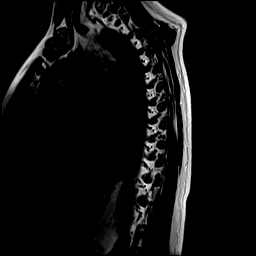
[im 4/17]
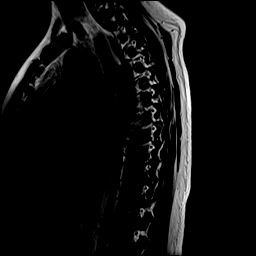
[im 7/17]
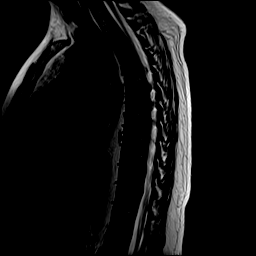
[im 10/17]
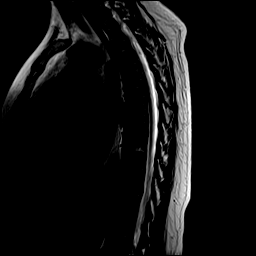
[im 13/17]
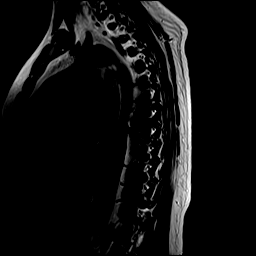
[im 17/17]
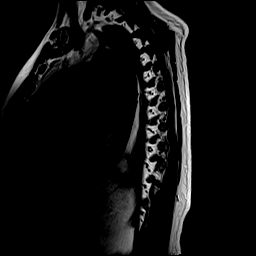

[Series 20: T1 · sagittal · 3.0mm · 1.33mm/px · 6 of 17 slices shown (2 of 2)]
[im 1/17]
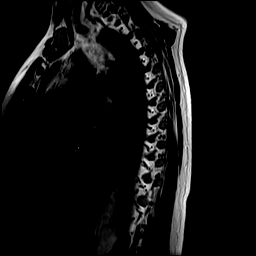
[im 4/17]
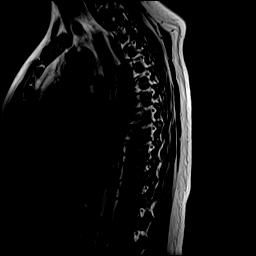
[im 7/17]
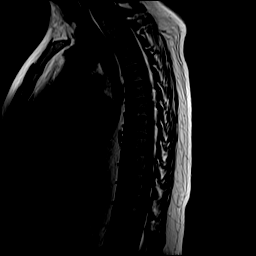
[im 10/17]
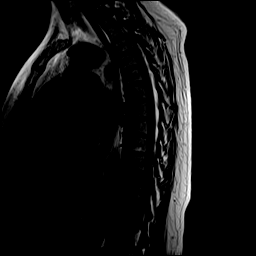
[im 13/17]
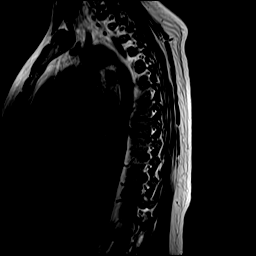
[im 17/17]
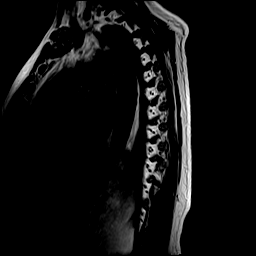

[Series 21: STIR · sagittal · 3.0mm · 0.66mm/px · 6 of 17 slices shown]
[im 1/17]
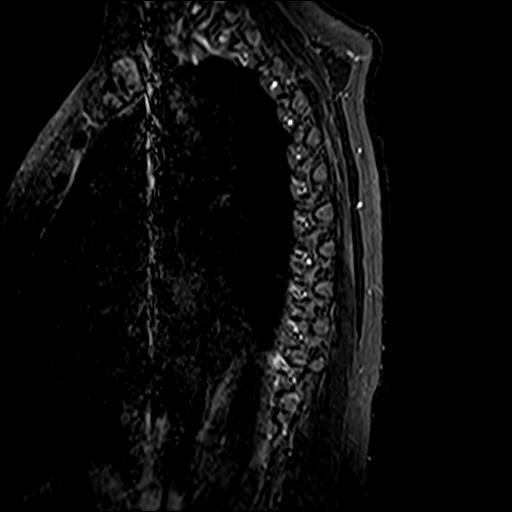
[im 4/17]
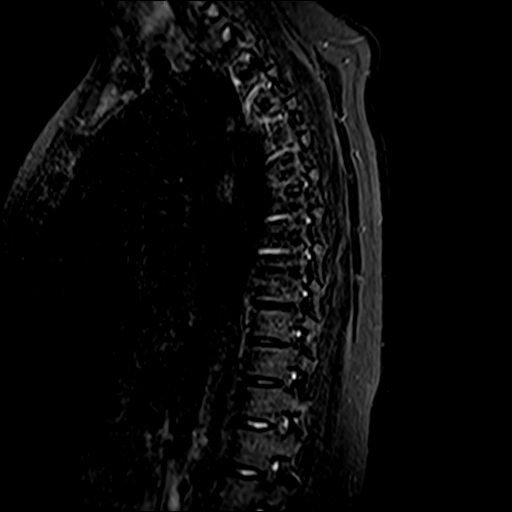
[im 7/17]
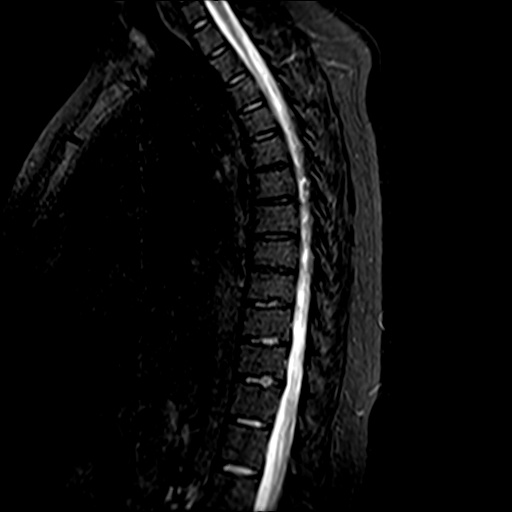
[im 10/17]
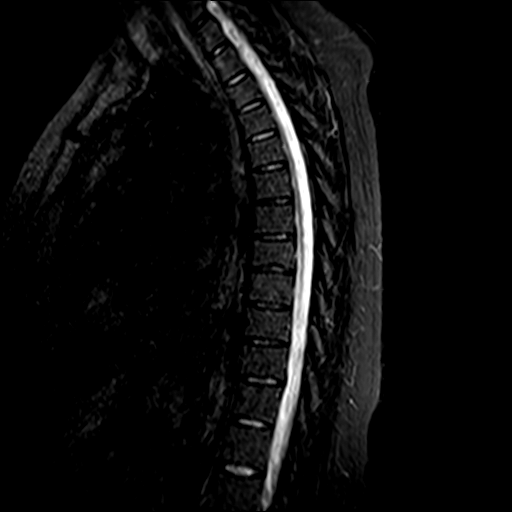
[im 13/17]
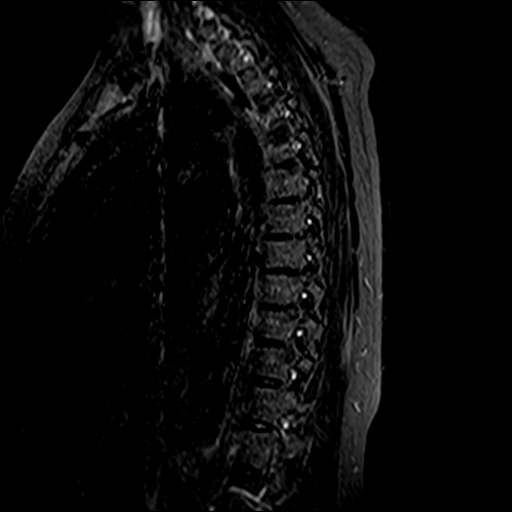
[im 17/17]
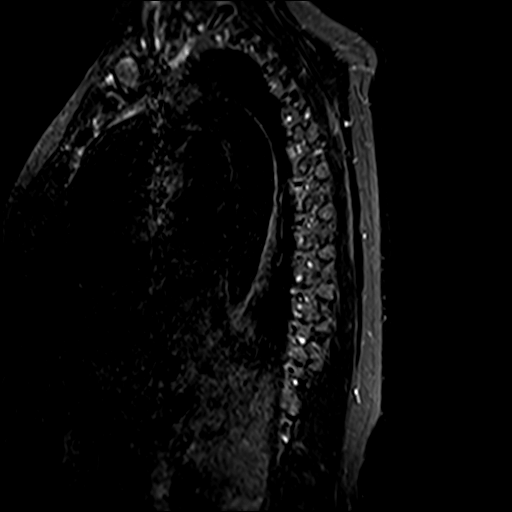

[Series 22: T2 · axial · 4.0mm · 0.74mm/px · z∈[-442,-223]mm · 8 of 39 slices shown (2 of 2)]
[im 1/39]
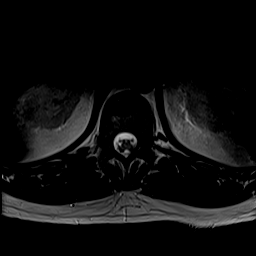
[im 6/39]
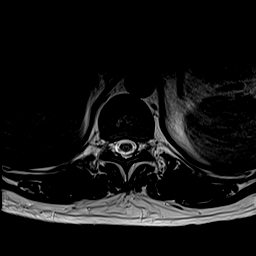
[im 12/39]
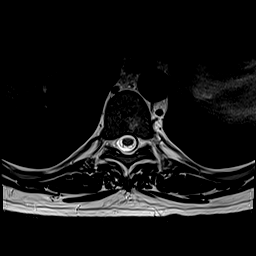
[im 18/39]
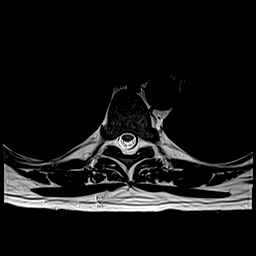
[im 21/39]
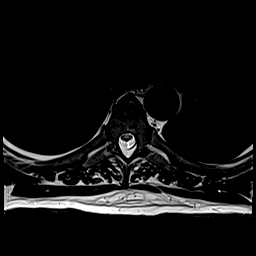
[im 27/39]
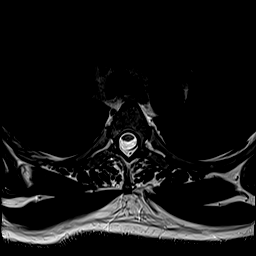
[im 33/39]
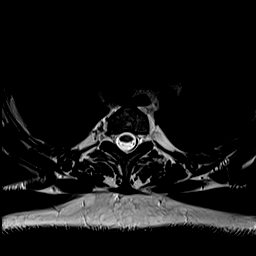
[im 39/39]
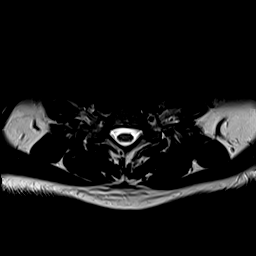

[Series 23: GRE · axial · 4.0mm · 0.37mm/px · z∈[-442,-283]mm · 6 of 39 slices shown]
[im 1/39]
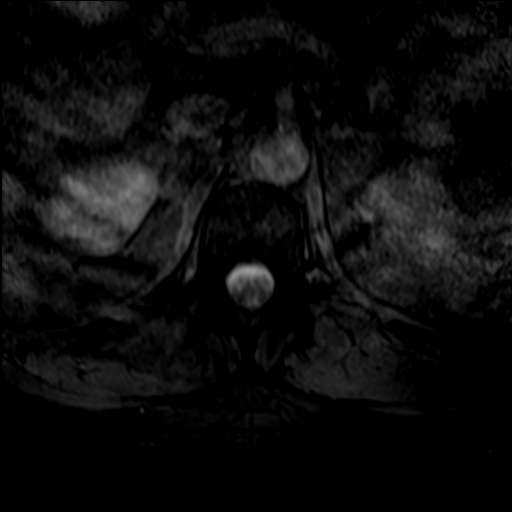
[im 6/39]
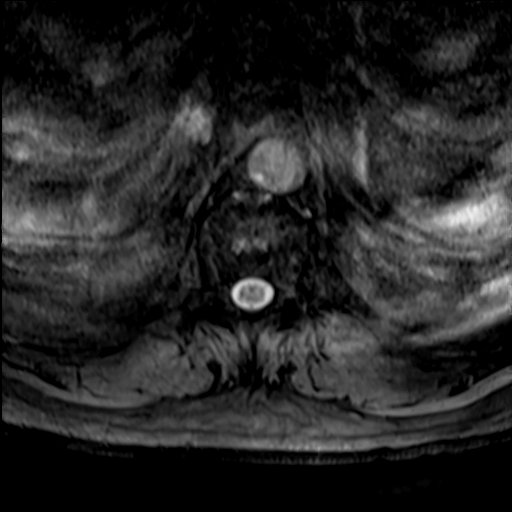
[im 12/39]
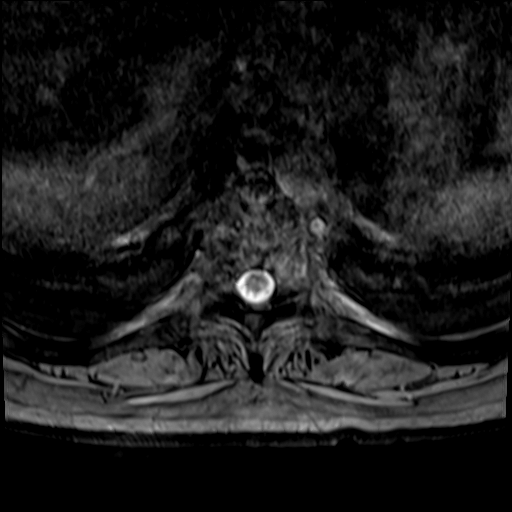
[im 18/39]
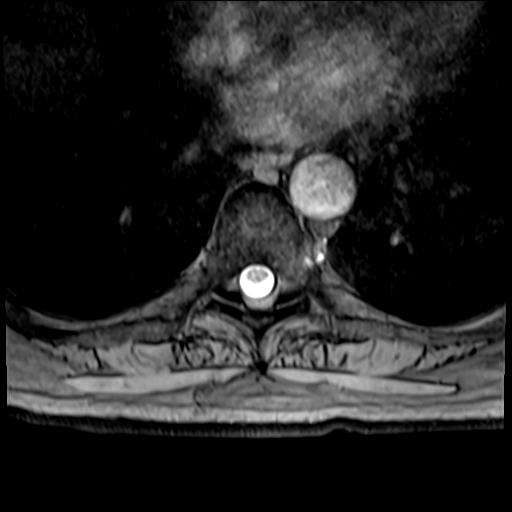
[im 21/39]
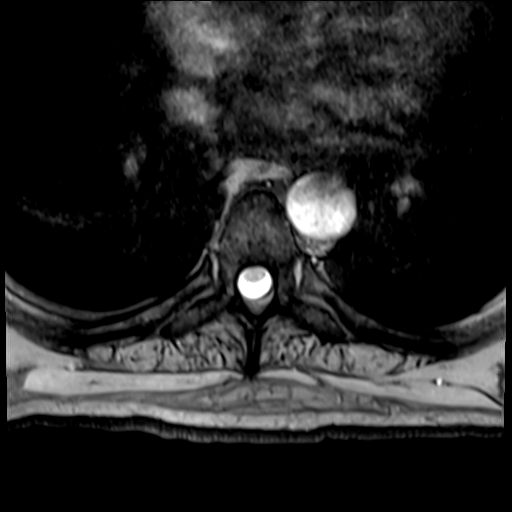
[im 27/39]
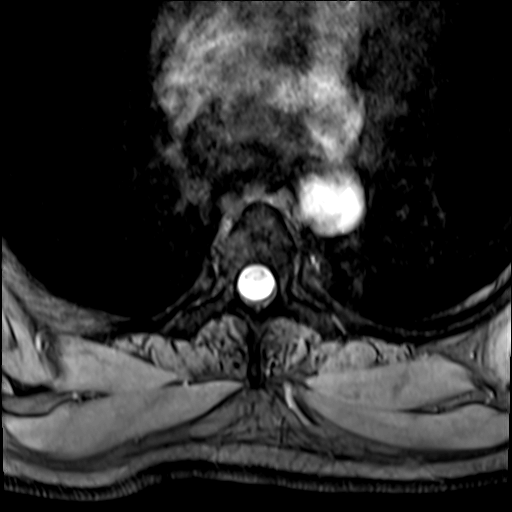

[34 of 48 positions shown; findings below may reference images not displayed]

FINDINGS: Limited cervical spine imaging:  Reported separately today.

Thoracic spine segmentation:  Appears to be normal.

Alignment:  Preserved thoracic kyphosis.  No spondylolisthesis.

Vertebrae: No marrow edema or evidence of acute osseous abnormality.
Mild degenerative thoracic endplate marrow signal changes, such as
at T9 inferiorly on the left. Normal background bone marrow signal.

Cord: Normal thoracic spinal cord signal and morphology to the conus
medullaris which is at L1. Fairly capacious thoracic spinal canal
and thecal sac.

Paraspinal and other soft tissues: Negative.

Disc levels:

T1-T2: Negative.

T2-T3: Negative.

T3-T4: Negative.

T4-T5: Negative.

T5-T6: Tiny central disc protrusion most apparent on series 19,
image 9. No spinal stenosis and no foraminal involvement.

T6-T7: Negative.

T7-T8: Small central to the left paracentral and slightly caudal
disc protrusion best seen on series 22, image 24. No spinal stenosis
and no foraminal involvement.

T8-T9: Negative.

T9-T10: Negative.

T10-T11: Mild facet hypertrophy.  No stenosis.

T11-T12: Minimal disc bulge. Mild facet hypertrophy. No significant
stenosis.

T12-L1: Negative.
IMPRESSION: 1. Largely normal for age MRI appearance of the thoracic spine. No
spinal stenosis or spinal cord abnormality.
2. Tiny disc herniations at T5-T6 and T7-T8 without associated
stenosis.

## 2021-07-16 ENCOUNTER — Ambulatory Visit: Payer: BC Managed Care – PPO

## 2021-07-16 ENCOUNTER — Ambulatory Visit
Admission: EM | Admit: 2021-07-16 | Discharge: 2021-07-16 | Disposition: A | Discharge status: Home / Self Care | Payer: Self-pay | Attending: Emergency Medicine | Admitting: Emergency Medicine

## 2021-07-16 ENCOUNTER — Other Ambulatory Visit: Payer: Self-pay

## 2021-07-16 DIAGNOSIS — Z113 Encounter for screening for infections with a predominantly sexual mode of transmission: Secondary | ICD-10-CM | POA: Insufficient documentation

## 2021-07-16 DIAGNOSIS — I10 Essential (primary) hypertension: Secondary | ICD-10-CM | POA: Insufficient documentation

## 2021-07-16 HISTORY — DX: Essential (primary) hypertension: I10

## 2021-07-16 NOTE — Discharge Instructions (Addendum)
Your testing has been sent to the lab.  If your testing results as positive, you will be notified via phone and we will initiate treatment at that time.  If you do not receive a phone call from [place] within the next 2 to 3 days, check your MyChart for updated health information. Do not engage in sex until you know your results. If you test positive you will need to abstain from sex for 7 days and notify all partners.  Your blood pressure is high today in our office.  I recommend you follow-up with your regular doctor for further evaluation and monitoring of this.  Consider checking your blood pressure at home or at a drug store to report back to your primary doctor. If your blood pressure remains high for long periods of time, it could affect your long term health.   Go to ER if you have dizziness, severe headache, blurry vision, chest pain, shortness of breath.

## 2021-07-16 NOTE — ED Provider Notes (Signed)
SUBJECTIVE:  Melvin Knight is a very pleasant 40 y.o. Male presents with need for STD testing. Patient reports no sexual encounters for the last two years, but does report some "questionable partners" in the past. Patient does not report any symptoms today. He also reports not taking his blood pressure medications and states that he hasn't seen his PCP in the last 6 years. He reports that his blood pressure is "always high in places like this". No dizziness, HA, weakness reported.  ROS: General/Constitutional: No fever, chills, or sweats GI: No abdominal pain, nausea/vomiting or diarrhea GU: As above Genitalia: As above Lymph: No swelling, red streaks or swollen lymph nodes Skin: No rashes or skin lesions   OBJECTIVE: Vitals:   07/16/21 0901  BP: (!) 176/135  Pulse: 96  Resp: 18  Temp: 99.2 F (37.3 C)  SpO2: 95%     General: Appears well-developed and well-nourished. No acute distress.  Cardiovascular: Normal rate  Pulm/Chest: No respiratory distress. Neurological: Alert and oriented to person, place, and time.  Skin: Skin is warm and dry.  Psychiatric: Normal mood, affect, behavior, and thought content.  Genitalia: Deferred  Laboratory:  Orders Placed This Encounter  Procedures   HIV Antibody (routine testing w rflx)   RPR   No results found for any visits on 07/16/21.   Assessment: 1. Screening examination for STD (sexually transmitted disease) - Cytology (oral, anal, urethral) ancillary only; Standing - HIV Antibody (routine testing w rflx); Standing - HIV4GL Save Tube; Standing - Cytology (oral, anal, urethral) ancillary only - HIV Antibody (routine testing w rflx) - HIV4GL Save Tube - RPR; Standing - RPR  2. Elevated blood pressure reading in office with diagnosis of hypertension   Plan:  MDM: Patient presents with need for STD testing. Patient reports no sexual encounters for the last two years, but does report some "questionable partners" in the past.  Patient does not report any symptoms today. He also reports not taking his blood pressure medications and states that he hasn't seen his PCP in the last 6 years. He reports that his blood pressure is "always high in places like this". No dizziness, HA, weakness reported.  Full panel STD test completed in clinic today to include gonorrhea, syphilis, chlamydia, trichomonas and HIV.  Advised that we will call with any positive results and negative results would upload directly to his MyChart account.  Briefly discussed his blood pressure in clinic today as the patient states he has not been taking his blood pressure medications and has not seen his PCP in over 6 years.  Patient does not report any concerning symptoms today with his blood pressure.  Did advise that he will need to follow-up with his PCP if his blood pressure continues to be elevated.  Patient verbalized understanding and agreed with plan.  Patient stable upon discharge.  Return as needed.  Instructions:    Discharge Instructions      Your testing has been sent to the lab.  If your testing results as positive, you will be notified via phone and we will initiate treatment at that time.  If you do not receive a phone call from [place] within the next 2 to 3 days, check your MyChart for updated health information. Do not engage in sex until you know your results. If you test positive you will need to abstain from sex for 7 days and notify all partners.  Your blood pressure is high today in our office.  I recommend you follow-up with  your regular doctor for further evaluation and monitoring of this.  Consider checking your blood pressure at home or at a drug store to report back to your primary doctor. If your blood pressure remains high for long periods of time, it could affect your long term health.   Go to ER if you have dizziness, severe headache, blurry vision, chest pain, shortness of breath.          Amalia Greenhouse, [place] 07/16/21 [phone]

## 2021-07-16 NOTE — ED Triage Notes (Signed)
Patient presents to Urgent Care for STD/HIV testing.  Denies any symptoms.

## 2021-07-17 LAB — CYTOLOGY, (ORAL, ANAL, URETHRAL) ANCILLARY ONLY
Chlamydia: NEGATIVE
Comment: NEGATIVE
Comment: NEGATIVE
Comment: NORMAL
Neisseria Gonorrhea: NEGATIVE
Trichomonas: NEGATIVE

## 2021-07-17 LAB — RPR: RPR Ser Ql: NONREACTIVE

## 2021-07-17 LAB — HIV ANTIBODY (ROUTINE TESTING W REFLEX): HIV Screen 4th Generation wRfx: NONREACTIVE

## 2021-11-30 ENCOUNTER — Other Ambulatory Visit: Payer: Self-pay

## 2021-11-30 ENCOUNTER — Encounter (HOSPITAL_COMMUNITY): Payer: Self-pay

## 2021-11-30 ENCOUNTER — Emergency Department (HOSPITAL_COMMUNITY)
Admission: EM | Admit: 2021-11-30 | Discharge: 2021-11-30 | Disposition: A | Discharge status: Home / Self Care | Payer: BC Managed Care – PPO | Attending: Physician Assistant | Admitting: Physician Assistant

## 2021-11-30 DIAGNOSIS — E860 Dehydration: Secondary | ICD-10-CM | POA: Insufficient documentation

## 2021-11-30 DIAGNOSIS — Z5321 Procedure and treatment not carried out due to patient leaving prior to being seen by health care provider: Secondary | ICD-10-CM | POA: Diagnosis not present

## 2021-11-30 DIAGNOSIS — R531 Weakness: Secondary | ICD-10-CM | POA: Insufficient documentation

## 2021-11-30 DIAGNOSIS — R55 Syncope and collapse: Secondary | ICD-10-CM | POA: Diagnosis not present

## 2021-11-30 HISTORY — DX: Hypokalemia: E87.6

## 2021-11-30 LAB — CBC WITH DIFFERENTIAL/PLATELET
Abs Immature Granulocytes: 0.01 10*3/uL (ref 0.00–0.07)
Basophils Absolute: 0.1 10*3/uL (ref 0.0–0.1)
Basophils Relative: 2 %
Eosinophils Absolute: 0.1 10*3/uL (ref 0.0–0.5)
Eosinophils Relative: 2 %
HCT: 49.2 % (ref 39.0–52.0)
Hemoglobin: 17.8 g/dL — ABNORMAL HIGH (ref 13.0–17.0)
Immature Granulocytes: 0 %
Lymphocytes Relative: 35 %
Lymphs Abs: 2.2 10*3/uL (ref 0.7–4.0)
MCH: 36.8 pg — ABNORMAL HIGH (ref 26.0–34.0)
MCHC: 36.2 g/dL — ABNORMAL HIGH (ref 30.0–36.0)
MCV: 101.7 fL — ABNORMAL HIGH (ref 80.0–100.0)
Monocytes Absolute: 0.7 10*3/uL (ref 0.1–1.0)
Monocytes Relative: 10 %
Neutro Abs: 3.2 10*3/uL (ref 1.7–7.7)
Neutrophils Relative %: 51 %
Platelets: 233 10*3/uL (ref 150–400)
RBC: 4.84 MIL/uL (ref 4.22–5.81)
RDW: 11.9 % (ref 11.5–15.5)
WBC: 6.3 10*3/uL (ref 4.0–10.5)
nRBC: 0 % (ref 0.0–0.2)

## 2021-11-30 LAB — COMPREHENSIVE METABOLIC PANEL
ALT: 83 U/L — ABNORMAL HIGH (ref 0–44)
AST: 71 U/L — ABNORMAL HIGH (ref 15–41)
Albumin: 4.9 g/dL (ref 3.5–5.0)
Alkaline Phosphatase: 93 U/L (ref 38–126)
Anion gap: 8 (ref 5–15)
BUN: 11 mg/dL (ref 6–20)
CO2: 27 mmol/L (ref 22–32)
Calcium: 9.4 mg/dL (ref 8.9–10.3)
Chloride: 103 mmol/L (ref 98–111)
Creatinine, Ser: 0.76 mg/dL (ref 0.61–1.24)
GFR, Estimated: >60 mL/min (ref >60)
Glucose, Bld: 140 mg/dL — ABNORMAL HIGH (ref 70–99)
Potassium: 4.1 mmol/L (ref 3.5–5.1)
Sodium: 138 mmol/L (ref 135–145)
Total Bilirubin: 1.3 mg/dL — ABNORMAL HIGH (ref 0.3–1.2)
Total Protein: 8.4 g/dL — ABNORMAL HIGH (ref 6.5–8.1)

## 2021-11-30 NOTE — ED Triage Notes (Signed)
Per EMS- patient is coming from work. Patient reports generalized weakness and a near syncopal episode today. EKG normal

## 2021-11-30 NOTE — ED Provider Triage Note (Signed)
Emergency Medicine Provider Triage Evaluation Note  Ahsan Esterline , a 41 y.o. male  was evaluated in triage.  Pt complains of weakness and feeling dehydrated. States he had a near syncopal episode while at work. States  this occurred in the past and he had hypokalemia.  Review of Systems  Positive: Weakness, near syncope Negative: Chest pain, sob, nvd  Physical Exam  BP (!) 159/138 (BP Location: Right Arm)    Pulse 91    Temp 98.6 F (37 C) (Oral)    Resp 16    SpO2 95%  Gen:   Awake, no distress   Resp:  Normal effort  MSK:   Moves extremities without difficulty   Medical Decision Making  Medically screening exam initiated at 1:02 PM.  Appropriate orders placed.  Tomasita Morrow was informed that the remainder of the evaluation will be completed by another provider, this initial triage assessment does not replace that evaluation, and the importance of remaining in the ED until their evaluation is complete.     Karrie Meres, PA-C 11/30/21 1303

## 2021-12-07 ENCOUNTER — Ambulatory Visit
Admission: RE | Admit: 2021-12-07 | Discharge: 2021-12-07 | Disposition: A | Discharge status: Home / Self Care | Payer: BC Managed Care – PPO | Source: Ambulatory Visit | Attending: Physician Assistant | Admitting: Physician Assistant

## 2021-12-07 ENCOUNTER — Ambulatory Visit (HOSPITAL_COMMUNITY): Payer: BC Managed Care – PPO

## 2021-12-07 VITALS — BP 184/125 | HR 99 | Temp 98.5°F | Resp 18

## 2021-12-07 DIAGNOSIS — I10 Essential (primary) hypertension: Secondary | ICD-10-CM | POA: Diagnosis not present

## 2021-12-07 MED ORDER — BENAZEPRIL-HYDROCHLOROTHIAZIDE 20-12.5 MG PO TABS: 1.0000 | ORAL_TABLET | Freq: Every day | ORAL | 11 refills | Status: DC

## 2021-12-07 NOTE — ED Provider Notes (Signed)
[facility]    CSN: [id number] Arrival date & time: 12/07/21  1040      History   Chief Complaint Chief Complaint  Patient presents with   Headache   Weakness   Hypertension    HPI Melvin Knight is a 41 y.o. male.   Patient complains of high blood pressure and a headache for the past week patient reports he had a weak sensation in his legs 1 week ago he went to the emergency department for evaluation and his blood pressure was high patient reports he had labs drawn however the wait at the hospital was long so he chose not to stay patient reports he has continued to check his blood pressure and his blood pressure is high he came to the urgent care today hoping to start treatment for high blood pressure patient states he has been prescribed blood pressure medicines in the past but admits he has been noncompliant with taking them.  Patient does not recall what medications he was taking.  Patient reports he has had headache and weakness on and off for the past week patient states symptoms come and go he does not have any continued numbness or weakness in any extremities or in his face patient denies any chest pain or any shortness of breath he has not had any fever or chills   Headache Associated symptoms: weakness   Weakness Associated symptoms: headaches   Hypertension Associated symptoms include headaches.   Past Medical History:  Diagnosis Date   Hypertension    Hypokalemia     Patient Active Problem List   Diagnosis Date Noted   Hypokalemic periodic paralysis 08/08/2019    History reviewed. No pertinent surgical history.     Home Medications    Prior to Admission medications   Medication Sig Start Date End Date Taking? Authorizing Provider  benazepril-hydrochlorthiazide (LOTENSIN HCT) 20-12.5 MG tablet Take 1 tablet by mouth daily. 12/07/21 12/07/22 Yes Cheron Schaumann K, PA-C  amLODipine (NORVASC) 5 MG tablet Take 1 tablet (5 mg total) by mouth daily.  08/10/19   Mayo, Allyn Kenner, MD    Family History Family History  Problem Relation Age of Onset   Healthy Mother    Stroke Father     Social History Social History   Tobacco Use   Smoking status: Every Day    Packs/day: 1.00    Types: Cigarettes   Smokeless tobacco: Never  Vaping Use   Vaping Use: Never used  Substance Use Topics   Alcohol use: Yes   Drug use: Not Currently     Allergies   Patient has no known allergies.   Review of Systems Review of Systems  Neurological:  Positive for weakness and headaches.  All other systems reviewed and are negative.   Physical Exam Triage Vital Signs ED Triage Vitals  Enc Vitals Group     BP 12/07/21 1112 (!) 184/125     Pulse Rate 12/07/21 1112 99     Resp 12/07/21 1112 18     Temp 12/07/21 1112 98.5 F (36.9 C)     Temp Source 12/07/21 1112 Oral     SpO2 12/07/21 1112 96 %     Weight --      Height --      Head Circumference --      Peak Flow --      Pain Score 12/07/21 1119 5     Pain Loc --      Pain Edu? --  Excl. in GC? --    No data found.  Updated Vital Signs BP (!) 184/125 (BP Location: Left Arm)    Pulse 99    Temp 98.5 F (36.9 C) (Oral)    Resp 18    SpO2 96%   Visual Acuity Right Eye Distance:   Left Eye Distance:   Bilateral Distance:    Right Eye Near:   Left Eye Near:    Bilateral Near:     Physical Exam Vitals and nursing note reviewed.  Constitutional:      Appearance: He is well-developed.  HENT:     Head: Normocephalic.  Eyes:     Extraocular Movements: Extraocular movements intact.     Pupils: Pupils are equal, round, and reactive to light.  Cardiovascular:     Rate and Rhythm: Normal rate and regular rhythm.     Heart sounds: Normal heart sounds.  Pulmonary:     Effort: Pulmonary effort is normal.     Breath sounds: Normal breath sounds.  Abdominal:     General: There is no distension.  Musculoskeletal:        General: Normal range of motion.     Cervical back:  Normal range of motion.  Neurological:     Mental Status: He is alert and oriented to person, place, and time.     Cranial Nerves: No cranial nerve deficit.  Psychiatric:        Mood and Affect: Mood normal.     UC Treatments / Results  Labs (all labs ordered are listed, but only abnormal results are displayed) Labs Reviewed - No data to display  EKG   Radiology No results found.  Procedures Procedures (including critical care time)  Medications Ordered in UC Medications - No data to display  Initial Impression / Assessment and Plan / UC Course  I have reviewed the triage vital signs and the nursing notes.  Pertinent labs & imaging results that were available during my care of the patient were reviewed by me and considered in my medical decision making (see chart for details).     Patient's laboratory evaluation from his emergency department visit on 1 9 are reviewed he had a slightly elevated glucose his renal functions were normal.  I counseled the patient about hypertension he is advised he needs to increase his exercise he is given information on Dash diet he is advised he needs to eliminate sodium patient has an appointment on 124 with a primary care physician I have advised the patient that if he has any neurologic symptoms he should return to the emergency department I am concerned that he has had high levels of hypertension for an extended period of time.  I will start the patient on benazepril hydrochlorothiazide in an attempt to control his blood pressure patient is advised that this drug may be changed by his primary care doctor. Final Clinical Impressions(s) / UC Diagnoses   Final diagnoses:  Essential hypertension     Discharge Instructions      See your Physician as scheduled   ED Prescriptions     Medication Sig Dispense Auth. Provider   benazepril-hydrochlorthiazide (LOTENSIN HCT) 20-12.5 MG tablet Take 1 tablet by mouth daily. 30 tablet Elson Areas, [place]      PDMP not reviewed this encounter.   Elson Areas, [place] 12/07/21 1336

## 2021-12-07 NOTE — Discharge Instructions (Addendum)
See your Physician as scheduled  

## 2021-12-07 NOTE — ED Triage Notes (Signed)
Pt here with weakness, HTN, and headache x 1 week. Pt was worked up in the ED on the 9th and was in the waiting room for 8 hours before leaving.

## 2021-12-15 ENCOUNTER — Encounter: Payer: Self-pay | Admitting: Internal Medicine

## 2021-12-15 ENCOUNTER — Other Ambulatory Visit: Payer: Self-pay

## 2021-12-15 ENCOUNTER — Telehealth: Payer: Self-pay | Admitting: *Deleted

## 2021-12-15 ENCOUNTER — Ambulatory Visit (INDEPENDENT_AMBULATORY_CARE_PROVIDER_SITE_OTHER): Payer: BC Managed Care – PPO | Admitting: Internal Medicine

## 2021-12-15 VITALS — BP 138/92 | HR 100 | Temp 98.3°F | Resp 16 | Ht 67.0 in | Wt 197.5 lb

## 2021-12-15 DIAGNOSIS — F439 Reaction to severe stress, unspecified: Secondary | ICD-10-CM

## 2021-12-15 DIAGNOSIS — Z789 Other specified health status: Secondary | ICD-10-CM

## 2021-12-15 DIAGNOSIS — Z23 Encounter for immunization: Secondary | ICD-10-CM | POA: Diagnosis not present

## 2021-12-15 DIAGNOSIS — R531 Weakness: Secondary | ICD-10-CM

## 2021-12-15 DIAGNOSIS — Z720 Tobacco use: Secondary | ICD-10-CM

## 2021-12-15 DIAGNOSIS — R7989 Other specified abnormal findings of blood chemistry: Secondary | ICD-10-CM | POA: Insufficient documentation

## 2021-12-15 DIAGNOSIS — I10 Essential (primary) hypertension: Secondary | ICD-10-CM | POA: Diagnosis not present

## 2021-12-15 MED ORDER — NICOTINE 14 MG/24HR TD PT24: 14.0000 mg | MEDICATED_PATCH | Freq: Every day | TRANSDERMAL | 0 refills | Status: DC

## 2021-12-15 NOTE — Chronic Care Management (AMB) (Signed)
°  Care Management   Note  12/15/2021 Name: Melvin Knight MRN: [id number] DOB: Feb 05, 1981  Melvin Knight is a 41 y.o. year old male who is a primary care patient of Margarita Mail, DO. I reached out to [employer] by phone today in response to a referral sent by Melvin Knight primary care provider.   Melvin Knight was given information about care management services today including:  Care management services include personalized support from designated clinical staff supervised by his physician, including individualized plan of care and coordination with other care providers 24/7 contact phone numbers for assistance for urgent and routine care needs. The patient may stop care management services at any time by phone call to the office staff.  Patient agreed to services and verbal consent obtained.   Follow up plan: Telephone appointment with care management team member scheduled for: 12/21/2021  Burman Nieves, CCMA Care Guide, Embedded Care Coordination [facility]   Care Management  Direct Dial: [phone]

## 2021-12-15 NOTE — Patient Instructions (Signed)
It was great seeing you today!  Plan discussed at today's visit: -Continue blood pressure medication and continue checking your blood pressure at home -Care coordination referral ordered today to help set up a counselor -If you would like to initiate medication prior to follow up please let me know   Follow up in: 1 month   Take care and let us know if you have any questions or concerns prior to your next visit.  Dr. Caralee Ates

## 2021-12-15 NOTE — Progress Notes (Signed)
New Patient Office Visit  Subjective:  Patient ID: Melvin Knight, male    DOB: 15-Mar-1981  Age: 41 y.o. MRN: [id number]  CC:  Chief Complaint  Patient presents with   Establish Care   Hypertension    Experiencing weakness went to ER was told he had high blood pressure and started on med.     HPI Melvin Knight presents as a new patient.   Hypertension: -Medications: Benazepril-HCTZ 20-12.5 started 1 week ago -Patient is compliant with above medications and reports no side effects. -Checking BP at home (average): 125-190/90-120 -Denies any SOB, CP, vision changes, LE edema or symptoms of hypotension - had some weakness a few days afterwards.  -Diet: eats a lot of processed meats, prepackaged meals with high sodium -Exercise: plays in adult soccer league   Weakness - has history in chart of hypokalemic periodic paralysis. Last K level 11/30/21 normal at 4.1. Was hospitalized in 2020 after extreme exercise, symptoms resolved after receiving potassium supplements. Not on potassium supplements.   Elevated LFTs: -1/23 - AST 71, ALT 83. 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 Stopped drinking for 3 days last week while on a trip, had no withdrawal symptoms. Family stress and job stress. Interested in counseling and social work to help with drinking.  Tobacco Use: -Less than pack a day x 20 years, wanting to quit  MDD: -Has been dealing with a lot of family and personal stress over the last 10 years. Spoke to a psychologist once, didn't find it helpful. Was never on medications. Feels apathic and depressed. Open to medication or counseling. Denies SI.    Depression screen [facility] 2/9 12/15/2021  Decreased Interest 0  Down, Depressed, Hopeless 1  PHQ - 2 Score 1  Altered sleeping 2  Tired, decreased energy 3  Change in appetite 2  Trouble concentrating 1  Moving slowly or fidgety/restless 2  Suicidal thoughts 0  PHQ-9 Score 11  Difficult doing  work/chores Somewhat difficult   Past Medical History:  Diagnosis Date   Hypertension    Hypokalemia     History reviewed. No pertinent surgical history.  Family History  Problem Relation Age of Onset   Hypertension Father    Stroke Father    Cancer Sister     Social History   Socioeconomic History   Marital status: Single    Spouse name: Not on file   Number of children: Not on file   Years of education: Not on file   Highest education level: Not on file  Occupational History   Not on file  Tobacco Use   Smoking status: Every Day    Packs/day: 1.00    Types: Cigarettes   Smokeless tobacco: Never  Vaping Use   Vaping Use: Never used  Substance and Sexual Activity   Alcohol use: Yes    Alcohol/week: 6.0 standard drinks    Types: 6 Shots of liquor per week    Comment: 6 cocktails per day   Drug use: Never   Sexual activity: Not Currently  Other Topics Concern   Not on file  Social History Narrative   Not on file   Social Determinants of Health   Financial Resource Strain: Not on file  Food Insecurity: Not on file  Transportation Needs: Not on file  Physical Activity: Not on file  Stress: Not on file  Social Connections: Not on file  Intimate Partner Violence: Not on file  ROS Review of Systems  Constitutional:  Positive for fatigue. Negative for chills and fever.  Eyes:  Negative for visual disturbance.  Respiratory:  Negative for cough and shortness of breath.   Cardiovascular:  Negative for chest pain.  Neurological:  Positive for weakness. Negative for dizziness and headaches.  Psychiatric/Behavioral:  Positive for decreased concentration and sleep disturbance. Negative for suicidal ideas.    Objective:   Today's Vitals: BP (!) 138/92    Pulse 100    Temp 98.3 F (36.8 C)    Resp 16    Ht 5\' 7"  (1.702 m)    Wt 197 lb 8 oz (89.6 kg)    SpO2 95%    BMI 30.93 kg/m   Physical Exam Constitutional:      Appearance: Normal appearance.  HENT:      Head: Normocephalic and atraumatic.  Eyes:     Conjunctiva/sclera: Conjunctivae normal.  Cardiovascular:     Rate and Rhythm: Normal rate and regular rhythm.  Pulmonary:     Effort: Pulmonary effort is normal.     Breath sounds: Normal breath sounds.  Musculoskeletal:     Right lower leg: No edema.     Left lower leg: No edema.  Skin:    General: Skin is warm and dry.  Neurological:     General: No focal deficit present.     Mental Status: He is alert. Mental status is at baseline.  Psychiatric:        Mood and Affect: Mood normal.        Behavior: Behavior normal.    Assessment & Plan:   1. Hypertension, unspecified type: Has been on Benazepril-HCTZ for about 1 week, BP here good at 138/92. Has been checking blood pressure at home, highest was 99991111 systolic but a wide range of values. Weakness resolved. Continue medication at current dose and checking BP at home for 1 month and recheck at follow up.   2. Weakness: Resolved. Does have history of hypokalemia induced weakness requiring hospitalization. Potassium good on 11/30/21, continue to monitor.   3. Elevated LFTs/Alcohol use: Liver enzymes high but reduced compared to 2020. Hepatitis panel in 2020 negative. Does endorse daily alcohol consumption, denies withdrawal symptoms. Referral placed to care coordination for this issue and high stress.   - AMB Referral to [place]  4. Tobacco use: Interested in quitting smoking, try patches, discussed starting Wellbutrin at follow up for both MDD and tobacco cessation but hesitant due to alcohol use and risk for seizures.   - nicotine (NICODERM CQ - DOSED IN MG/24 HOURS) 14 mg/24hr patch; Place 1 patch (14 mg total) onto the skin daily.  Dispense: 28 patch; Refill: 0  5. Stress: PHQ-9 elevated at 11, stress is primarily situational and due to family and work. Discussed at length medication vs. Counseling, at this point we will try counseling and follow up in 1 month.   -  AMB Referral to [place]  6. Need for influenza vaccination: Flu vaccine administered today.  - Flu Vaccine QUAD 6+ mos PF IM (Fluarix Quad PF)  7. Need for Tdap vaccination: Tdap administered today.   - Tdap vaccine greater than or equal to 7yo IM  Follow-up: Return in about 4 weeks (around 01/12/2022).   [name], DO

## 2021-12-15 NOTE — Chronic Care Management (AMB) (Signed)
°  Care Management   Outreach Note  12/15/2021 Name: Melvin Knight MRN: [id number] DOB: 12/25/1980  Referred by: [name], DO Reason for referral : Care Coordination (Initial outreach to schedule referral with Licensed Clinical SW)   An unsuccessful telephone outreach was attempted today. The patient was referred to the case management team for assistance with care management and care coordination.   Follow Up Plan:  A HIPAA compliant phone message was left for the patient providing contact information and requesting a return call.  If patient returns call to provider office, please advise to call Embedded Care Management Care Guide Thomson Herbers at [place], [facility]  Direct Dial: [phone]

## 2021-12-21 ENCOUNTER — Telehealth: Payer: Self-pay | Admitting: *Deleted

## 2021-12-21 ENCOUNTER — Telehealth: Payer: BC Managed Care – PPO | Admitting: *Deleted

## 2021-12-21 NOTE — Telephone Encounter (Addendum)
°  Care Management   Follow Up Note   12/21/2021 Name: Jurgen Groeneveld MRN: [id number] DOB: 20-May-1981   Referred by: Margarita Mail, DO Reason for referral : No chief complaint on file.   An unsuccessful telephone outreach was attempted today. The patient was referred to the case management team for assistance with care management and care coordination.   Follow Up Plan: Telephone follow up appointment with care management team member to be scheduled by Care Guide.  Shayda Kalka, LCSW Clinical Social Worker  [facility] Care Management [phone] This encounter was created in error - please disregard.

## 2022-01-01 ENCOUNTER — Ambulatory Visit: Payer: BC Managed Care – PPO | Admitting: *Deleted

## 2022-01-01 DIAGNOSIS — Z789 Other specified health status: Secondary | ICD-10-CM

## 2022-01-01 DIAGNOSIS — F439 Reaction to severe stress, unspecified: Secondary | ICD-10-CM

## 2022-01-01 NOTE — Telephone Encounter (Signed)
°  Care Management   Follow Up Note   12/21/2021-late entry Name: Melvin Knight MRN: [id number] DOB: [birth year]   Referred by: [name], DO Reason for referral : No chief complaint on file.   An unsuccessful telephone outreach was attempted today. The patient was referred to the case management team for assistance with care management and care coordination.   Follow Up Plan: Telephone follow up appointment with care management team member scheduled for: 01/01/22   Elliot Gurney, [place] Worker  [facility] Care Management [phone]

## 2022-01-01 NOTE — Addendum Note (Signed)
Addended by: Wenda Overland on: 01/01/2022 09:30 AM   Modules accepted: Orders, Level of Service

## 2022-01-03 NOTE — Patient Instructions (Addendum)
Visit Information  Thank you for taking time to visit with me today. Please don't hesitate to contact me if I can be of assistance to you before our next scheduled telephone appointment.  Following are the goals we discussed today:  (Copy and paste patient goals from clinical care plan here)  Our next appointment is by telephone on 12/2721 at 2pm  Please call the care guide team at [phone] if you need to cancel or reschedule your appointment.   If you are experiencing a Mental Health or Behavioral Health Crisis or need someone to talk to, please call the Suicide and Crisis Lifeline: 988   Following is a copy of your full plan of care:  Care Plan : General Social Work (Adult)  Updates made by [employer], Clent Jacks, LCSW since 01/03/2022 12:00 AM     Problem: CHL AMB "PATIENT-SPECIFIC PROBLEM"   Note:   CARE PLAN ENTRY (see longitudinal plan of care for additional care plan information)  Current Barriers:  Knowledge deficits related to accessing mental health provider in patient with  stress   Patient is experiencing symptoms of  increased stress which seem to be exacerbated by increased work demands and family responsibilities.     Patient needs Support, Education, and Care Coordination in order to meet unmet mental health needs  Mental Health Concerns   Clinical Social Work Goal(s):  Over the next 90 days, patient will work with SW bi-weekly by telephone or in person to reduce or manage symptoms of stress until connected for ongoing counseling resources.  Patient will implement clinical interventions discussed today to decreases symptoms of stress and increase knowledge and/or ability of: coping skills.  Interventions:  Assessed patient's understanding, education, previous treatment and care coordination needs  Patient interviewed and appropriate assessments performed: PHQ 2 PHQ 9 Patient discussed increased job stress and difficulty with care giving demands in regards to his  family Provided basic mental health support, education and interventions  Positive coping strategies explored, self care emphasized Collaborated with appropriate clinical care team members regarding patient needs Discussed options for long term counseling based on need and insurance. [facility] referral completed for ongoing mental health counseling. Other interventions include: Solution-Focused Strategies employed:  Active listening / Reflection utilized  Caregiver stress acknowledged  Participation in counseling encouraged    Patient Self Care Activities & Deficits:  Patient is unable to independently navigate community resource options without care coordination support Patient is able to implement clinical interventions discussed today and is motivated for treatment  Patient will select one of the agencies from the list provided and call to schedule an appointment  Performs ADL's independently Performs IADL's independently Motivation for treatment  Initial goal documentation      Mr. Pinsky was given information about Care Management services by the embedded care coordination team including:  Care Management services include personalized support from designated clinical staff supervised by his physician, including individualized plan of care and coordination with other care providers 24/7 contact phone numbers for assistance for urgent and routine care needs. The patient may stop CCM services at any time (effective at the end of the month) by phone call to the office staff.  Patient agreed to services and verbal consent obtained.   Patient verbalizes understanding of instructions and care plan provided today and agrees to view in MyChart. Active MyChart status confirmed with patient.    Telephone follow up appointment with care management team member scheduled for: 01/18/22  Verna Czech, LCSW Clinical Social Worker  [facility] Care  Management [phone]

## 2022-01-03 NOTE — Chronic Care Management (AMB) (Addendum)
Care Management Clinical Social Work Note  01/03/2022 Name: Melvin Knight MRN: [id number] DOB: [birth year]  Melvin Knight is a 41 y.o. year old male who is a primary care patient of Melvin Mail, DO.  The Care Management team was consulted for assistance with chronic disease management and coordination needs.  Engaged with patient by telephone for initial visit in response to provider referral for social work chronic care management and care coordination services  Consent to Services:  Melvin Knight was given information about Care Management services today including:  Care Management services includes personalized support from designated clinical staff supervised by his physician, including individualized plan of care and coordination with other care providers 24/7 contact phone numbers for assistance for urgent and routine care needs. The patient may stop case management services at any time by phone call to the office staff.  Patient agreed to services and consent obtained.   Assessment: Review of patient past medical history, allergies, medications, and health status, including review of relevant consultants reports was performed today as part of a comprehensive evaluation and provision of chronic care management and care coordination services.  SDOH (Social Determinants of Health) assessments and interventions performed:    Advanced Directives Status: Not addressed in this encounter.  Care Plan  No Known Allergies  Outpatient Encounter Medications as of 01/01/2022  Medication Sig   benazepril-hydrochlorthiazide (LOTENSIN HCT) 20-12.5 MG tablet Take 1 tablet by mouth daily.   nicotine (NICODERM CQ - DOSED IN MG/24 HOURS) 14 mg/24hr patch Place 1 patch (14 mg total) onto the skin daily.   No facility-administered encounter medications on file as of 01/01/2022.    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     Patient needs Support, Education, and Care Coordination in order to meet unmet mental health needs  Mental Health Concerns   Clinical Social Work Goal(s):  Over the next 90 days, patient will work with SW bi-weekly by telephone or in person to reduce or manage symptoms of stress until connected for ongoing counseling resources.  Patient will implement clinical interventions discussed today to decreases symptoms of stress and increase knowledge and/or ability of: coping skills.  Interventions:  Assessed patient's understanding, education, previous treatment and care coordination needs  Patient interviewed and appropriate assessments performed: PHQ 2 PHQ 9 Patient discussed increased job stress and difficulty with care giving demands in regards to his family Provided basic mental health support, education and interventions  Positive coping strategies explored, self care emphasized Collaborated with appropriate clinical care team members regarding patient needs Discussed options for long term counseling based on need and insurance. [facility] referral completed for ongoing mental health counseling. Other interventions include: Solution-Focused Strategies employed:  Active listening / Reflection utilized  Caregiver stress acknowledged  Participation in counseling encouraged     Patient  Self Care Activities & Deficits:  Patient is unable to independently navigate community resource options without care coordination support Patient is able to implement clinical interventions discussed today and is motivated for treatment  Patient will select one of the agencies from the list provided and call to schedule an appointment  Performs ADL's independently Performs IADL's independently Motivation for treatment  Initial goal documentation       Follow Up Plan: SW will follow up with patient by phone over the next 14 business days  Melvin Knight, [place] Clinical Social Worker  [facility] Care Management [phone]

## 2022-01-11 ENCOUNTER — Encounter: Payer: Self-pay | Admitting: Internal Medicine

## 2022-01-11 ENCOUNTER — Ambulatory Visit: Payer: BC Managed Care – PPO | Admitting: Internal Medicine

## 2022-01-11 VITALS — BP 152/110 | HR 109 | Temp 98.4°F | Resp 16 | Ht 67.0 in | Wt 199.8 lb

## 2022-01-11 DIAGNOSIS — F439 Reaction to severe stress, unspecified: Secondary | ICD-10-CM

## 2022-01-11 DIAGNOSIS — R7989 Other specified abnormal findings of blood chemistry: Secondary | ICD-10-CM

## 2022-01-11 DIAGNOSIS — F1020 Alcohol dependence, uncomplicated: Secondary | ICD-10-CM | POA: Insufficient documentation

## 2022-01-11 DIAGNOSIS — Z13220 Encounter for screening for lipoid disorders: Secondary | ICD-10-CM | POA: Diagnosis not present

## 2022-01-11 DIAGNOSIS — I10 Essential (primary) hypertension: Secondary | ICD-10-CM | POA: Diagnosis not present

## 2022-01-11 LAB — COMPLETE METABOLIC PANEL WITH GFR
AG Ratio: 1.6 (calc) (ref 1.0–2.5)
ALT: 46 U/L (ref 9–46)
AST: 37 U/L (ref 10–40)
Albumin: 4.8 g/dL (ref 3.6–5.1)
Alkaline phosphatase (APISO): 89 U/L (ref 36–130)
BUN: 16 mg/dL (ref 7–25)
CO2: 29 mmol/L (ref 20–32)
Calcium: 9.8 mg/dL (ref 8.6–10.3)
Chloride: 103 mmol/L (ref 98–110)
Creat: 0.8 mg/dL (ref 0.60–1.26)
Globulin: 3 g/dL (calc) (ref 1.9–3.7)
Glucose, Bld: 103 mg/dL — ABNORMAL HIGH (ref 65–99)
Potassium: 4.3 mmol/L (ref 3.5–5.3)
Sodium: 140 mmol/L (ref 135–146)
Total Bilirubin: 0.9 mg/dL (ref 0.2–1.2)
Total Protein: 7.8 g/dL (ref 6.1–8.1)
eGFR: 116 mL/min/{1.73_m2} (ref >=60)

## 2022-01-11 LAB — LIPID PANEL
Cholesterol: 258 mg/dL — ABNORMAL HIGH (ref <200)
HDL: 61 mg/dL (ref >=40)
LDL Cholesterol (Calc): 166 mg/dL (calc) — ABNORMAL HIGH
Non-HDL Cholesterol (Calc): 197 mg/dL (calc) — ABNORMAL HIGH (ref <130)
Total CHOL/HDL Ratio: 4.2 (calc) (ref <5.0)
Triglycerides: 162 mg/dL — ABNORMAL HIGH (ref <150)

## 2022-01-11 NOTE — Patient Instructions (Signed)
It was great seeing you today!  Plan discussed at today's visit: -Blood work ordered today, results will be uploaded to MyChart.  -Double blood pressure medication - take 2 in the morning -Continue to check blood pressure at home, write these down and bring to next appointment -If BP is low (<100/60), go back to taking only one pill and call me.   Follow up in: 1 month   Take care and let us know if you have any questions or concerns prior to your next visit.  Dr. Caralee Ates

## 2022-01-11 NOTE — Assessment & Plan Note (Signed)
Still not controlled, double Benazepril to 40 mg, HCTZ 25 mg. Continue to check BP at home and follow up in 1 month.

## 2022-01-11 NOTE — Progress Notes (Signed)
Established Patient Office Visit  Subjective:  Patient ID: Melvin Knight, male    DOB: [birth year]  Age: 41 y.o. MRN: [id number]  CC:  Chief Complaint  Patient presents with   Hypertension    HPI Melvin Knight presents for follow up.   Hypertension: -Medications: Benazepril-HCTZ 20-12.5 -Patient is compliant with above medications and reports no side effects. -Checking BP at home (average): 130-160/100  -Denies any SOB, CP, vision changes, LE edema or symptoms of hypotension. Feeling more weak   Elevated LFTs/Alcohol Use Disorder: -CMP 11/30/21 AST 71, ALT 83 -Discussed with community care, in the process of setting up with a counselor -Still drinking 5-6 mixed drinks a day on most days   Stress/MDD: -Mood status: stable -Current treatment: None -Setting up counseling  Depression screen [facility] 2/9 01/11/2022 01/01/2022 12/15/2021  Decreased Interest 1 0 0  Down, Depressed, Hopeless 1 1 1   PHQ - 2 Score 2 1 1   Altered sleeping 0 2 2  Tired, decreased energy 0 2 3  Change in appetite 0 2 2  Feeling bad or failure about yourself  0 - -  Trouble concentrating 0 1 1  Moving slowly or fidgety/restless 0 0 2  Suicidal thoughts 0 0 0  PHQ-9 Score 2 8 11   Difficult doing work/chores Not difficult at all - Somewhat difficult    Past Medical History:  Diagnosis Date   Hypertension    Hypokalemia     No past surgical history on file.  Family History  Problem Relation Age of Onset   Hypertension Father    Stroke Father    Cancer Sister     Social History   Socioeconomic History   Marital status: Single    Spouse name: Not on file   Number of children: Not on file   Years of education: Not on file   Highest education level: Not on file  Occupational History   Not on file  Tobacco Use   Smoking status: Every Day    Packs/day: 1.00    Types: Cigarettes   Smokeless tobacco: Never  Vaping Use   Vaping Use: Never used  Substance and Sexual Activity   Alcohol use: Yes     Alcohol/week: 6.0 standard drinks    Types: 6 Shots of liquor per week    Comment: 6 cocktails per day   Drug use: Never   Sexual activity: Not Currently  Other Topics Concern   Not on file  Social History Narrative   Not on file   Social Determinants of Health   Financial Resource Strain: Not on file  Food Insecurity: Not on file  Transportation Needs: Not on file  Physical Activity: Not on file  Stress: Not on file  Social Connections: Socially Isolated   Frequency of Communication with Friends and Family: Three times a week   Frequency of Social Gatherings with Friends and Family: More than three times a week   Attends Religious Services: Never   Marine scientist or Organizations: No   Attends Archivist Meetings: Never   Marital Status: Never married  Human resources officer Violence: Not on file    Outpatient Medications Prior to Visit  Medication Sig Dispense Refill   benazepril-hydrochlorthiazide (LOTENSIN HCT) 20-12.5 MG tablet Take 1 tablet by mouth daily. 30 tablet 11   nicotine (NICODERM CQ - DOSED IN MG/24 HOURS) 14 mg/24hr patch Place 1 patch (14 mg total) onto the skin daily. 28 patch 0   No facility-administered medications prior  to visit.    No Known Allergies  ROS Review of Systems  Constitutional:  Negative for chills and fever.  Eyes:  Negative for visual disturbance.  Respiratory:  Negative for cough and shortness of breath.   Cardiovascular:  Negative for chest pain.  Neurological:  Positive for weakness. Negative for dizziness and headaches.     Objective:    Physical Exam Constitutional:      Appearance: Normal appearance.  HENT:     Head: Normocephalic and atraumatic.  Eyes:     Conjunctiva/sclera: Conjunctivae normal.  Cardiovascular:     Rate and Rhythm: Normal rate and regular rhythm.  Pulmonary:     Effort: Pulmonary effort is normal.     Breath sounds: Normal breath sounds.  Musculoskeletal:     Right lower leg: No  edema.     Left lower leg: No edema.  Skin:    General: Skin is warm and dry.  Neurological:     General: No focal deficit present.     Mental Status: He is alert. Mental status is at baseline.  Psychiatric:        Mood and Affect: Mood normal.        Behavior: Behavior normal.    BP (!) 158/110    Pulse (!) 109    Temp 98.4 F (36.9 C)    Resp 16    Ht 5\' 7"  (1.702 m)    Wt 199 lb 12.8 oz (90.6 kg)    SpO2 98%    BMI 31.29 kg/m  Wt Readings from Last 3 Encounters:  12/15/21 197 lb 8 oz (89.6 kg)  11/30/21 200 lb (90.7 kg)  08/08/19 205 lb 8 oz (93.2 kg)     Health Maintenance Due  Topic Date Due   COVID-19 Vaccine (2 - Booster for Janssen series) 03/29/2020    There are no preventive care reminders to display for this patient.  Lab Results  Component Value Date   TSH 1.559 08/08/2019   Lab Results  Component Value Date   WBC 6.3 11/30/2021   HGB 17.8 (H) 11/30/2021   HCT 49.2 11/30/2021   MCV 101.7 (H) 11/30/2021   PLT 233 11/30/2021   Lab Results  Component Value Date   NA 138 11/30/2021   K 4.1 11/30/2021   CO2 27 11/30/2021   GLUCOSE 140 (H) 11/30/2021   BUN 11 11/30/2021   CREATININE 0.76 11/30/2021   BILITOT 1.3 (H) 11/30/2021   ALKPHOS 93 11/30/2021   AST 71 (H) 11/30/2021   ALT 83 (H) 11/30/2021   PROT 8.4 (H) 11/30/2021   ALBUMIN 4.9 11/30/2021   CALCIUM 9.4 11/30/2021   ANIONGAP 8 11/30/2021   No results found for: CHOL No results found for: HDL No results found for: LDLCALC No results found for: TRIG No results found for: CHOLHDL No results found for: HGBA1C    Assessment & Plan:   Problem List Items Addressed This Visit       Cardiovascular and Mediastinum   HTN (hypertension) - Primary    Still not controlled, double Benazepril to 40 mg, HCTZ 25 mg. Continue to check BP at home and follow up in 1 month.       Relevant Orders   COMPLETE METABOLIC PANEL WITH GFR     Other   Elevated LFTs    Recheck CMP.       Relevant  Orders   COMPLETE METABOLIC PANEL WITH GFR   Alcohol use disorder, moderate, dependence ([place])  Following with community care, plan to start counseling soon. Check CMP today.      Other Visit Diagnoses     Lipid screening       Relevant Orders   Lipid Profile   Stress    - starting counseling, recheck at follow up.        Follow-up: Return in about 4 weeks (around 02/08/2022).    [name], DO

## 2022-01-11 NOTE — Assessment & Plan Note (Signed)
Following with community care, plan to start counseling soon. Check CMP today.

## 2022-01-11 NOTE — Assessment & Plan Note (Signed)
Recheck CMP 

## 2022-01-15 ENCOUNTER — Ambulatory Visit: Payer: Self-pay

## 2022-01-15 DIAGNOSIS — I10 Essential (primary) hypertension: Secondary | ICD-10-CM

## 2022-01-15 MED ORDER — AMLODIPINE BESYLATE 2.5 MG PO TABS: 2.5000 mg | ORAL_TABLET | Freq: Every day | ORAL | 1 refills | Status: DC

## 2022-01-15 NOTE — Telephone Encounter (Signed)
Pt.notified

## 2022-01-15 NOTE — Telephone Encounter (Signed)
° °  Chief Complaint: Elevated BP readings. Seen 01/11/22 and doubled his Benazepril as ordered. BP this morning before medicine 193/131 and after meds 146/101 Symptoms: Weakness. Notices "that when I'm on my tractor doing yard work, my BP comes down." Frequency: This week Pertinent Negatives: Patient denies headache or chest pain Disposition: [] ED /[] Urgent Care (no appt availability in office) / [] Appointment(In office/virtual)/ []  [facility]/ [] Home Care/ [] Refused Recommended Disposition /[] [facility]/ []  Follow-up with PCP Additional Notes: Declines appointment. Please advise pt.   Answer Assessment - Initial Assessment Questions 1. BLOOD PRESSURE: "What is the blood pressure?" "Did you take at least two measurements 5 minutes apart?"     175/124      146/101 2. ONSET: "When did you take your blood pressure?"     Today 3. HOW: "How did you obtain the blood pressure?" (e.g., visiting nurse, automatic home BP monitor)     Home cuff 4. HISTORY: "Do you have a history of high blood pressure?"     Yes 5. MEDICATIONS: "Are you taking any medications for blood pressure?" "Have you missed any doses recently?"     Yes 6. OTHER SYMPTOMS: "Do you have any symptoms?" (e.g., headache, chest pain, blurred vision, difficulty breathing, weakness)     Weakness 7. PREGNANCY: "Is there any chance you are pregnant?" "When was your last menstrual period?"     N/a  Protocols used: Blood Pressure - High-A-AH

## 2022-01-15 NOTE — Telephone Encounter (Signed)
Continue Benazepril 40 and HCTZ 25 in the mornings, drink at least 60 oz of fluid a day and I will add a third medication to take at night to help lower blood pressure as well. His blood work was reassuring here on 2/20 but if if his weakness is worsening he needs to go to the ER. It can take a few weeks for the medication to control his blood pressure, keep follow up appointment the end of March.

## 2022-01-18 ENCOUNTER — Encounter: Payer: Self-pay | Admitting: Internal Medicine

## 2022-01-18 ENCOUNTER — Other Ambulatory Visit: Payer: Self-pay

## 2022-01-18 ENCOUNTER — Ambulatory Visit: Payer: BC Managed Care – PPO | Admitting: *Deleted

## 2022-01-18 DIAGNOSIS — F1020 Alcohol dependence, uncomplicated: Secondary | ICD-10-CM

## 2022-01-18 DIAGNOSIS — F439 Reaction to severe stress, unspecified: Secondary | ICD-10-CM

## 2022-01-18 DIAGNOSIS — I10 Essential (primary) hypertension: Secondary | ICD-10-CM

## 2022-01-18 MED ORDER — AMLODIPINE BESYLATE 2.5 MG PO TABS: 2.5000 mg | ORAL_TABLET | Freq: Every day | ORAL | 1 refills | Status: DC

## 2022-01-18 NOTE — Chronic Care Management (AMB) (Signed)
Care Management Clinical Social Work Note  01/18/2022 Name: Melvin Knight MRN: [id number] DOB: 1981/05/10  Melvin Knight is a 41 y.o. year old male who is a primary care patient of Melvin Mail, DO.  The Care Management team was consulted for assistance with chronic disease management and coordination needs.  Engaged with patient by telephone for follow up visit in response to provider referral for social work chronic care management and care coordination services  Consent to Services:  Melvin Knight was given information about Care Management services today including:  Care Management services includes personalized support from designated clinical staff supervised by his physician, including individualized plan of care and coordination with other care providers 24/7 contact phone numbers for assistance for urgent and routine care needs. The patient may stop case management services at any time by phone call to the office staff.  Patient agreed to services and consent obtained.   Assessment: Review of patient past medical history, allergies, medications, and health status, including review of relevant consultants reports was performed today as part of a comprehensive evaluation and provision of chronic care management and care coordination services.  SDOH (Social Determinants of Health) assessments and interventions performed:    Advanced Directives Status: Not addressed in this encounter.  Care Plan  No Known Allergies  Outpatient Encounter Medications as of 01/18/2022  Medication Sig   amLODipine (NORVASC) 2.5 MG tablet Take 1 tablet (2.5 mg total) by mouth daily. Take at night before bed.   benazepril-hydrochlorthiazide (LOTENSIN HCT) 20-12.5 MG tablet Take 1 tablet by mouth daily.   nicotine (NICODERM CQ - DOSED IN MG/24 HOURS) 14 mg/24hr patch Place 1 patch (14 mg total) onto the skin daily.   No facility-administered encounter medications on file as of 01/18/2022.    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     Patient needs Support, Education, and Care Coordination in order to meet unmet mental health needs  Mental Health Concerns   Clinical Social Work Goal(s):  Over the next 90 days, patient will work with SW bi-weekly by telephone or in person to reduce or manage symptoms of stress until connected for ongoing counseling resources.  Patient will implement clinical interventions discussed today to decreases symptoms of stress and increase knowledge and/or ability of: coping skills.  Interventions:  Assessed patient's understanding, education, previous treatment and care coordination needs  Patient discussed concerns with continued weakness, now affecting his functioning Patient confirmed inability to identify a trigger to episodes of weakness-primary care doctor contacted to discuss treatment options Provided basic emotional support and interventions  Encouraged verbalization of feelings Positive coping strategies explored, self care emphasized Followed up on long term counseling referral. [facility] referral completed for ongoing mental health counseling appointment scheduled for 02/10/22  Other  interventions include: Solution-Focused Strategies employed:  Active listening / Reflection utilized  Participation in counseling encouraged    Patient Self Care Activities & Deficits:  Patient is unable to independently navigate community resource options without care coordination support Patient is able to implement clinical interventions discussed today and is motivated for treatment  Patient will select one of the agencies from the list provided and call to schedule an appointment  Performs ADL's independently Performs IADL's independently Motivation for treatment  Please see past updates related to this goal by clicking on the "Past Updates" button in the selected goal        Follow Up Plan: SW will follow up with patient by phone over the next 02/01/22  Melvin Czech, LCSW Clinical Social Worker  [facility] Care Management [phone]

## 2022-01-18 NOTE — Patient Instructions (Signed)
Visit Information  Thank you for taking time to visit with me today. Please don't hesitate to contact me if I can be of assistance to you before our next scheduled telephone appointment.  Following are the goals we discussed today:  - begin personal counseling-initial appointment scheduled for 01/13/22 - talk about feelings with a friend, family or spiritual advisor - practice positive thinking and self-talk     Our next appointment is by telephone on 02/01/22 at [place]  Please call the care guide team at [phone] if you need to cancel or reschedule your appointment.   If you are experiencing a Mental Health or [place] or need someone to talk to, please call the Suicide and Crisis Lifeline: 988   Patient verbalizes understanding of instructions and care plan provided today and agrees to view in [place]. Active MyChart status confirmed with patient.    Telephone follow up appointment with care management team member scheduled for: 02/01/22  Elliot Gurney, [place] Worker  [facility] Care Management [phone]

## 2022-01-19 NOTE — Telephone Encounter (Signed)
Appt made

## 2022-01-22 ENCOUNTER — Ambulatory Visit (INDEPENDENT_AMBULATORY_CARE_PROVIDER_SITE_OTHER): Payer: BC Managed Care – PPO | Admitting: Internal Medicine

## 2022-01-22 ENCOUNTER — Encounter: Payer: Self-pay | Admitting: Internal Medicine

## 2022-01-22 VITALS — BP 150/102 | HR 107 | Temp 99.1°F | Resp 16 | Ht 67.0 in | Wt 191.0 lb

## 2022-01-22 DIAGNOSIS — R7989 Other specified abnormal findings of blood chemistry: Secondary | ICD-10-CM

## 2022-01-22 DIAGNOSIS — F439 Reaction to severe stress, unspecified: Secondary | ICD-10-CM | POA: Diagnosis not present

## 2022-01-22 DIAGNOSIS — F1020 Alcohol dependence, uncomplicated: Secondary | ICD-10-CM

## 2022-01-22 DIAGNOSIS — R531 Weakness: Secondary | ICD-10-CM

## 2022-01-22 DIAGNOSIS — I10 Essential (primary) hypertension: Secondary | ICD-10-CM

## 2022-01-22 MED ORDER — ESCITALOPRAM OXALATE 5 MG PO TABS: 5.0000 mg | ORAL_TABLET | Freq: Every day | ORAL | 3 refills | Status: DC

## 2022-01-22 MED ORDER — BENAZEPRIL-HYDROCHLOROTHIAZIDE 20-12.5 MG PO TABS: 1.0000 | ORAL_TABLET | Freq: Two times a day (BID) | ORAL | 3 refills | Status: DC

## 2022-01-22 NOTE — Assessment & Plan Note (Signed)
Blood pressure continues to be high, refills today with Amlodipine 2.5 added at night. Better when outside on tractor, stress definitely a factor. ?

## 2022-01-22 NOTE — Progress Notes (Signed)
Established Patient Office Visit  Subjective:  Patient ID: Melvin Knight, male    DOB: 1981/01/31  Age: 41 y.o. MRN: [id number]  CC:  Chief Complaint  Patient presents with   Follow-up   Hypertension   Weakness    HPI Melvin Knight presents for follow up on HTN and weakness. Went to the ER on 01/20/22 for weakness. Labs revealed mag of 1.1, Vitamin B12 low and both supplemented. Feeling more weak lately all over, feels like legs will give out but this lasts seconds at a time.   Hypertension: -Medications: Benazepril-HCTZ 20-12.5, Amlodipine added 2.5 at night (hasn't started yet) -Patient is compliant with above medications and reports no side effects. -Checking BP at home (average): 135-175/90-107 -Denies any SOB, CP, vision changes, LE edema or symptoms of hypotension.Continues to endorse generalized weakness.   Elevated LFTs/Alcohol Use Disorder: -CMP 01/20/2022 with ALT 59, AST 64 -Discussed with community care, in the process of setting up with a counselor -Still drinking 5-6 mixed drinks a day on most days    Stress/MDD: -Mood status: stable -Current treatment: None  -Setting up counseling   Depression screen [facility] 2/9 01/22/2022 01/22/2022 01/11/2022 01/01/2022 12/15/2021  Decreased Interest 3 3 1  0 0  Down, Depressed, Hopeless 2 2 1 1 1   PHQ - 2 Score 5 5 2 1 1   Altered sleeping 1 - 0 2 2  Tired, decreased energy 2 - 0 2 3  Change in appetite 1 - 0 2 2  Feeling bad or failure about yourself  2 - 0 - -  Trouble concentrating 0 - 0 1 1  Moving slowly or fidgety/restless 2 - 0 0 2  Suicidal thoughts 0 - 0 0 0  PHQ-9 Score 13 - 2 8 11   Difficult doing work/chores Somewhat difficult - Not difficult at all - Somewhat difficult     Past Medical History:  Diagnosis Date   Hypertension    Hypokalemia     No past surgical history on file.  Family History  Problem Relation Age of Onset   Hypertension Father    Stroke Father    Cancer Sister     Social History    Socioeconomic History   Marital status: Single    Spouse name: Not on file   Number of children: Not on file   Years of education: Not on file   Highest education level: Not on file  Occupational History   Not on file  Tobacco Use   Smoking status: Every Day    Packs/day: 1.00    Types: Cigarettes   Smokeless tobacco: Never  Vaping Use   Vaping Use: Never used  Substance and Sexual Activity   Alcohol use: Yes    Alcohol/week: 6.0 standard drinks    Types: 6 Shots of liquor per week    Comment: 6 cocktails per day   Drug use: Never   Sexual activity: Not Currently  Other Topics Concern   Not on file  Social History Narrative   Not on file   Social Determinants of Health   Financial Resource Strain: Not on file  Food Insecurity: Not on file  Transportation Needs: Not on file  Physical Activity: Not on file  Stress: Not on file  Social Connections: Socially Isolated   Frequency of Communication with Friends and Family: Three times a week   Frequency of Social Gatherings with Friends and Family: More than three times a week   Attends Religious Services: Never   Active Member  of Clubs or Organizations: No   Attends Archivist Meetings: Never   Marital Status: Never married  Human resources officer Violence: Not on file    Outpatient Medications Prior to Visit  Medication Sig Dispense Refill   amLODipine (NORVASC) 2.5 MG tablet Take 1 tablet (2.5 mg total) by mouth daily. Take at night before bed. 30 tablet 1   benazepril-hydrochlorthiazide (LOTENSIN HCT) 20-12.5 MG tablet Take 1 tablet by mouth daily. 30 tablet 11   nicotine (NICODERM CQ - DOSED IN MG/24 HOURS) 14 mg/24hr patch Place 1 patch (14 mg total) onto the skin daily. 28 patch 0   No facility-administered medications prior to visit.    No Known Allergies  ROS Review of Systems  Constitutional:  Positive for fatigue and unexpected weight change. Negative for chills and fever.  Eyes:  Negative for  visual disturbance.  Respiratory:  Negative for cough and shortness of breath.   Cardiovascular:  Negative for chest pain.  Gastrointestinal:  Positive for diarrhea. Negative for abdominal pain, nausea and vomiting.  Genitourinary:  Negative for dysuria and hematuria.  Neurological:  Positive for weakness. Negative for dizziness, light-headedness, numbness and headaches.  Psychiatric/Behavioral:  Positive for sleep disturbance. The patient is nervous/anxious.      Objective:    Physical Exam Constitutional:      Appearance: Normal appearance.  HENT:     Head: Normocephalic and atraumatic.     Mouth/Throat:     Pharynx: Posterior oropharyngeal erythema present.  Eyes:     Conjunctiva/sclera: Conjunctivae normal.  Cardiovascular:     Rate and Rhythm: Normal rate and regular rhythm.  Pulmonary:     Effort: Pulmonary effort is normal.     Breath sounds: Normal breath sounds.  Abdominal:     General: There is no distension.     Palpations: Abdomen is soft.     Tenderness: There is no abdominal tenderness. There is no guarding.  Musculoskeletal:        General: No tenderness. Normal range of motion.     Right lower leg: No edema.     Left lower leg: No edema.  Skin:    General: Skin is warm and dry.  Neurological:     General: No focal deficit present.     Mental Status: He is alert. Mental status is at baseline.     Cranial Nerves: No cranial nerve deficit.     Sensory: No sensory deficit.     Coordination: Coordination normal.     Gait: Gait normal.     Deep Tendon Reflexes: Reflexes normal.     Comments: 3/5 strength in left lower extremity dorsiflexion with intact plantarflexion   Psychiatric:        Mood and Affect: Mood normal.        Behavior: Behavior normal.     Comments: Anxious appearing    BP (!) 150/102    Pulse (!) 107    Temp 99.1 F (37.3 C)    Resp 16    Ht $R'5\' 7"'xD$  (1.702 m)    Wt 191 lb (86.6 kg)    SpO2 98%    BMI 29.91 kg/m  Wt Readings from Last 3  Encounters:  01/22/22 191 lb (86.6 kg)  01/11/22 199 lb 12.8 oz (90.6 kg)  12/15/21 197 lb 8 oz (89.6 kg)     Health Maintenance Due  Topic Date Due   COVID-19 Vaccine (2 - Booster for Janssen series) 03/29/2020    There are no preventive  care reminders to display for this patient.  Lab Results  Component Value Date   TSH 1.559 08/08/2019   Lab Results  Component Value Date   WBC 6.3 11/30/2021   HGB 17.8 (H) 11/30/2021   HCT 49.2 11/30/2021   MCV 101.7 (H) 11/30/2021   PLT 233 11/30/2021   Lab Results  Component Value Date   NA 140 01/11/2022   K 4.3 01/11/2022   CO2 29 01/11/2022   GLUCOSE 103 (H) 01/11/2022   BUN 16 01/11/2022   CREATININE 0.80 01/11/2022   BILITOT 0.9 01/11/2022   ALKPHOS 93 11/30/2021   AST 37 01/11/2022   ALT 46 01/11/2022   PROT 7.8 01/11/2022   ALBUMIN 4.9 11/30/2021   CALCIUM 9.8 01/11/2022   ANIONGAP 8 11/30/2021   EGFR 116 01/11/2022   Lab Results  Component Value Date   CHOL 258 (H) 01/11/2022   Lab Results  Component Value Date   HDL 61 01/11/2022   Lab Results  Component Value Date   LDLCALC 166 (H) 01/11/2022   Lab Results  Component Value Date   TRIG 162 (H) 01/11/2022   Lab Results  Component Value Date   CHOLHDL 4.2 01/11/2022   No results found for: HGBA1C    Assessment & Plan:   Problem List Items Addressed This Visit       Cardiovascular and Mediastinum   HTN (hypertension)    Blood pressure continues to be high, refills today with Amlodipine 2.5 added at night. Better when outside on tractor, stress definitely a factor.      Relevant Medications   benazepril-hydrochlorthiazide (LOTENSIN HCT) 20-12.5 MG tablet     Other   Elevated LFTs    Secondary to alcohol use.      Alcohol use disorder, moderate, dependence ([place])    Trying to cut back, working with social work and has a Theme park manager up.       Stress    Start Lexapro 5 mg daily.       Relevant Medications   escitalopram (LEXAPRO) 5  MG tablet   Other Visit Diagnoses     Weakness    -  Primary - recheck vitamins, started a once a daily men's vitamin but may need to increase supplements depending on results.    Relevant Orders   Basic Metabolic Panel (BMET)   TSH   B12 and Folate Panel   Vitamin B1   Magnesium       Meds ordered this encounter  Medications   escitalopram (LEXAPRO) 5 MG tablet    Sig: Take 1 tablet (5 mg total) by mouth daily.    Dispense:  30 tablet    Refill:  3   benazepril-hydrochlorthiazide (LOTENSIN HCT) 20-12.5 MG tablet    Sig: Take 1 tablet by mouth 2 (two) times daily.    Dispense:  180 tablet    Refill:  3    Follow-up: Return for already scheduled .    [name], DO

## 2022-01-22 NOTE — Patient Instructions (Addendum)
It was great seeing you today! ? ?Plan discussed at today's visit: ?-Blood work ordered today, results will be uploaded to MyChart.  ?-Continue Benazepril-HCTZ twice a day and Amlodipine 2.5 mg before bed ?-Continue to follow with social work  ?-New medication for stress/anxiety Lexapro 5 mg, this will take 4-6 weeks to get into your system ? ?Follow up in: already scheduled  ? ?Take care and let us know if you have any questions or concerns prior to your next visit. ? ?Dr. Caralee Ates ? ?

## 2022-01-22 NOTE — Assessment & Plan Note (Signed)
Secondary to alcohol use 

## 2022-01-22 NOTE — Assessment & Plan Note (Signed)
-   Start Lexapro 5 mg daily

## 2022-01-22 NOTE — Assessment & Plan Note (Signed)
Trying to cut back, working with social work and has a Garment/textile technologist up.  ?

## 2022-01-23 ENCOUNTER — Encounter: Payer: Self-pay | Admitting: Internal Medicine

## 2022-01-26 LAB — MAGNESIUM: Magnesium: 1.5 mg/dL (ref 1.5–2.5)

## 2022-01-26 LAB — VITAMIN B1

## 2022-01-26 LAB — BASIC METABOLIC PANEL
BUN/Creatinine Ratio: 18 (calc) (ref 6–22)
BUN: 23 mg/dL (ref 7–25)
CO2: 22 mmol/L (ref 20–32)
Calcium: 9.2 mg/dL (ref 8.6–10.3)
Chloride: 100 mmol/L (ref 98–110)
Creat: 1.31 mg/dL — ABNORMAL HIGH (ref 0.60–1.26)
Glucose, Bld: 113 mg/dL — ABNORMAL HIGH (ref 65–99)
Potassium: 3.8 mmol/L (ref 3.5–5.3)
Sodium: 136 mmol/L (ref 135–146)

## 2022-01-26 LAB — B12 AND FOLATE PANEL
Folate: 5 ng/mL — ABNORMAL LOW
Vitamin B-12: 196 pg/mL — ABNORMAL LOW (ref 200–1100)

## 2022-01-26 LAB — TSH: TSH: 1.15 mIU/L (ref 0.40–4.50)

## 2022-02-08 ENCOUNTER — Other Ambulatory Visit: Payer: Self-pay

## 2022-02-08 ENCOUNTER — Ambulatory Visit (INDEPENDENT_AMBULATORY_CARE_PROVIDER_SITE_OTHER): Payer: BC Managed Care – PPO | Admitting: Family Medicine

## 2022-02-08 ENCOUNTER — Encounter: Payer: Self-pay | Admitting: Family Medicine

## 2022-02-08 VITALS — BP 160/98 | HR 110 | Temp 98.3°F | Resp 16 | Ht 67.0 in | Wt 197.7 lb

## 2022-02-08 DIAGNOSIS — E669 Obesity, unspecified: Secondary | ICD-10-CM

## 2022-02-08 DIAGNOSIS — I10 Essential (primary) hypertension: Secondary | ICD-10-CM

## 2022-02-08 DIAGNOSIS — F439 Reaction to severe stress, unspecified: Secondary | ICD-10-CM

## 2022-02-08 DIAGNOSIS — R200 Anesthesia of skin: Secondary | ICD-10-CM | POA: Diagnosis not present

## 2022-02-08 DIAGNOSIS — R531 Weakness: Secondary | ICD-10-CM | POA: Diagnosis not present

## 2022-02-08 DIAGNOSIS — F1020 Alcohol dependence, uncomplicated: Secondary | ICD-10-CM

## 2022-02-08 DIAGNOSIS — Z6830 Body mass index (BMI) 30.0-30.9, adult: Secondary | ICD-10-CM

## 2022-02-08 NOTE — Assessment & Plan Note (Signed)
Improved with SSRI, continue. ?

## 2022-02-08 NOTE — Assessment & Plan Note (Addendum)
Maintain appointment with counseling. Continue to try to cut back as able. Continue MVI. ?

## 2022-02-08 NOTE — Progress Notes (Addendum)
? ?SUBJECTIVE:  ? ?CHIEF COMPLAINT / HPI:  ? ?Hypertension: ?- Medications: benazepril-HCTZ, amlodipine ?- Compliance: taking benazepril once daily. Stopped amlodipine last week.  ?- Checking BP at home: yes, 150-160s in am. ?- Denies any SOB, CP, vision changes, LE edema, medication SEs, or symptoms of hypotension ? ?MDD/Stress ?- Medications: lexapro ?- Taking: good compliance ?- Counseling: not yet ? ? ?  02/08/2022  ?  8:13 AM 01/22/2022  ? 11:22 AM 01/22/2022  ? 11:19 AM  ?Depression screen PHQ 2/9  ?Decreased Interest 1 3 3   ?Down, Depressed, Hopeless 1 2 2   ?PHQ - 2 Score 2 5 5   ?Altered sleeping 0 1   ?Tired, decreased energy 0 2   ?Change in appetite 0 1   ?Feeling bad or failure about yourself  0 2   ?Trouble concentrating 0 0   ?Moving slowly or fidgety/restless 0 2   ?Suicidal thoughts 0 0   ?PHQ-9 Score 2 13   ?Difficult doing work/chores Not difficult at all Somewhat difficult   ? ? ? ?Alcohol use - 4-6 drinks per day. Last drink last night around 6pm. No h/o withdrawal symptoms. Started drinking around 41yo. Getting set up with cessation counseling on Wednesday. ? ?Weakness ?- reporting intermittent weakness for the past 6 weeks. ?- seen previously in ED 3/1 with hypomagnesiumia, B12 deficiency. CT Head w/o NAICA. EKG sinus tach.  ?- worse in legs, reports sometimes hard to stand up, walk at normal pace.  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 Also was in dining hall at [facility] and had to be wheeled out in wheelchair due to weakness. ?- Denies pain, lightheadedness, dizziness, vision changes, recent illness.  ?- reports occasional tingling.  ?- symptoms mostly occur in the morning, resolve by afternoon.  ?- taking multivitamin.  ? ? ?OBJECTIVE:  ? ?BP (!) 160/98   Pulse (!) 110   Temp 98.3 ?F (36.8 ?C)   Resp 16   Ht 5\' 7"  (1.702 m)   Wt 197 lb 11.2 oz (89.7 kg)   SpO2 97%   BMI 30.96 kg/m?   ?Gen: well appearing, in NAD ?Card: RRR ?Lungs:  CTAB ?Ext: WWP, no edema ?MSK: Full ROM, strength 5/5 to U/LE bilaterally. Grip strength and pincer grasp intact. 5/5 strength with resisted wrist extension/flexion. Decreased strength against resisted finger abduction. Full strength with finger adduction.  No edema.  ?Neuro: Alert and oriented, speech normal. Extraocular movements intact.  Intact symmetric sensation to light touch of face and extremities bilaterally.  Hearing grossly intact bilaterally.  Tongue protrudes normally with no deviation.  Shoulder shrug, smile symmetric. Deep tendon reflexes intact. Normal gait. Tremulous. ? ?ASSESSMENT/PLAN:  ? ?Alcohol use disorder, moderate, dependence ([place]) ?Maintain appointment with counseling. Continue to try to cut back as able. Continue MVI. ? ?Stress ?Improved with SSRI, continue. ? ?Weakness ?Unclear etiology. Intermittent x6 weeks. Neuro and MSK exam wnl today other than mild weakness of finger abduction. Of note, with previous hospitalization for similar symptoms with hypokalemia, resolved with repletion. Most recent K wnl. Did have vitamin deficiencies in ED with similar symptoms but repleted. Also with stress/anxiety but improving with addition of SSRI. Will obtain labs to assess for contributors. Recommend cessation from alcohol, maintain establishment with counseling. If labs unrevealing with persistent sx, consider referral to neuro for EMG. ? ?HTN (hypertension) ?Resume BID dosing of benazepril-HCTZ. Continue to monitor BP at home, if still not to goal after a  few weeks, consider adding back amlodipine. ?  ? ? ?Myles Gip, DO ?

## 2022-02-08 NOTE — Assessment & Plan Note (Addendum)
Unclear etiology. Intermittent x6 weeks. Neuro and MSK exam wnl today other than mild weakness of finger abduction. Of note, with previous hospitalization for similar symptoms with hypokalemia, resolved with repletion. Most recent K wnl. Did have vitamin deficiencies in ED with similar symptoms but repleted. Also with stress/anxiety but improving with addition of SSRI. Will obtain labs to assess for contributors. Recommend cessation from alcohol, maintain establishment with counseling. If labs unrevealing with persistent sx, consider referral to neuro for EMG. ?

## 2022-02-08 NOTE — Patient Instructions (Addendum)
It was great to see you! ? ?Our plans for today:  ?- Take the benazepril-HCTZ twice daily. Keep an eye on your blood pressures with a goal of top number less than 140. Continue to hold the amlodipine for now.  ?- Continue the lexapro.  ?- Keep your appointment with the counselor. ? ?We are checking some labs today, we will release these results to your MyChart. ? ?Take care and seek immediate care sooner if you develop any concerns.  ? ?Dr. Linwood Dibbles ? ?

## 2022-02-09 ENCOUNTER — Encounter: Payer: Self-pay | Admitting: Neurology

## 2022-02-09 NOTE — Addendum Note (Signed)
Addended by: Caro Laroche on: 02/09/2022 08:42 AM ? ? Modules accepted: Orders ? ?

## 2022-02-13 LAB — COMPREHENSIVE METABOLIC PANEL
AG Ratio: 1.7 (calc) (ref 1.0–2.5)
ALT: 58 U/L — ABNORMAL HIGH (ref 9–46)
AST: 60 U/L — ABNORMAL HIGH (ref 10–40)
Albumin: 4.8 g/dL (ref 3.6–5.1)
Alkaline phosphatase (APISO): 99 U/L (ref 36–130)
BUN: 22 mg/dL (ref 7–25)
CO2: 26 mmol/L (ref 20–32)
Calcium: 9.4 mg/dL (ref 8.6–10.3)
Chloride: 107 mmol/L (ref 98–110)
Creat: 0.88 mg/dL (ref 0.60–1.26)
Globulin: 2.8 g/dL (calc) (ref 1.9–3.7)
Glucose, Bld: 109 mg/dL — ABNORMAL HIGH (ref 65–99)
Potassium: 3.7 mmol/L (ref 3.5–5.3)
Sodium: 144 mmol/L (ref 135–146)
Total Bilirubin: 0.9 mg/dL (ref 0.2–1.2)
Total Protein: 7.6 g/dL (ref 6.1–8.1)

## 2022-02-13 LAB — HEMOGLOBIN A1C
Hgb A1c MFr Bld: 5.7 % of total Hgb — ABNORMAL HIGH (ref <5.7)
Mean Plasma Glucose: 117 mg/dL
eAG (mmol/L): 6.5 mmol/L

## 2022-02-13 LAB — CBC WITH DIFFERENTIAL/PLATELET
Absolute Monocytes: 370 cells/uL (ref 200–950)
Basophils Absolute: 80 cells/uL (ref 0–200)
Basophils Relative: 1.9 %
Eosinophils Absolute: 88 cells/uL (ref 15–500)
Eosinophils Relative: 2.1 %
HCT: 41.7 % (ref 38.5–50.0)
Hemoglobin: 14.7 g/dL (ref 13.2–17.1)
Lymphs Abs: 1285 cells/uL (ref 850–3900)
MCH: 35.3 pg — ABNORMAL HIGH (ref 27.0–33.0)
MCHC: 35.3 g/dL (ref 32.0–36.0)
MCV: 100.2 fL — ABNORMAL HIGH (ref 80.0–100.0)
MPV: 9.7 fL (ref 7.5–12.5)
Monocytes Relative: 8.8 %
Neutro Abs: 2377 cells/uL (ref 1500–7800)
Neutrophils Relative %: 56.6 %
Platelets: 199 10*3/uL (ref 140–400)
RBC: 4.16 10*6/uL — ABNORMAL LOW (ref 4.20–5.80)
RDW: 13.1 % (ref 11.0–15.0)
Total Lymphocyte: 30.6 %
WBC: 4.2 10*3/uL (ref 3.8–10.8)

## 2022-02-13 LAB — VITAMIN B12: Vitamin B-12: 263 pg/mL (ref 200–1100)

## 2022-02-13 LAB — FOLATE: Folate: 22.4 ng/mL

## 2022-02-13 LAB — CK: Total CK: 102 U/L (ref 44–196)

## 2022-02-13 LAB — LACTATE DEHYDROGENASE: LDH: 170 U/L (ref 100–220)

## 2022-02-13 LAB — MAGNESIUM: Magnesium: 1.6 mg/dL (ref 1.5–2.5)

## 2022-02-13 LAB — VITAMIN B1: Vitamin B1 (Thiamine): 13 nmol/L (ref 8–30)

## 2022-02-23 ENCOUNTER — Ambulatory Visit: Payer: BC Managed Care – PPO | Admitting: Neurology

## 2022-02-23 ENCOUNTER — Other Ambulatory Visit (INDEPENDENT_AMBULATORY_CARE_PROVIDER_SITE_OTHER): Payer: BC Managed Care – PPO

## 2022-02-23 ENCOUNTER — Encounter: Payer: Self-pay | Admitting: Neurology

## 2022-02-23 VITALS — BP 154/118 | HR 98 | Ht 67.0 in | Wt 195.0 lb

## 2022-02-23 DIAGNOSIS — E538 Deficiency of other specified B group vitamins: Secondary | ICD-10-CM | POA: Diagnosis not present

## 2022-02-23 DIAGNOSIS — R531 Weakness: Secondary | ICD-10-CM | POA: Diagnosis not present

## 2022-02-23 LAB — VITAMIN B12: Vitamin B-12: 104 pg/mL — ABNORMAL LOW (ref 211–911)

## 2022-02-23 NOTE — Assessment & Plan Note (Signed)
Resume BID dosing of benazepril-HCTZ. Continue to monitor BP at home, if still not to goal after a few weeks, consider adding back amlodipine. ?

## 2022-02-23 NOTE — Patient Instructions (Signed)
Good to meet you. ? ?Bloodwork for B12, methylmalonic acid, myasthenia panel ? ?2. We will order the nerve/muscle test through [facility] ? ?3. Continue to monitor triggers, start low-carbohydrate diet and plan for weaning down/off alcohol  ? ?4. Follow-up with our neuromuscular specialist Dr. Allena Katz, call for any changes ?

## 2022-02-23 NOTE — Progress Notes (Signed)
? ?NEUROLOGY CONSULTATION NOTE ? ?Melvin Knight ?MRN: [id number] ?DOB: 09/09/1981 ? ?Referring provider: Dr. Ellwood DenseAlison Rumball ?Primary care provider: Dr. Margarita MailElisabeth Andrews ? ?Reason for consult:  weakness ? ?Dear Dr Linwood Dibblesumball: ? ?Thank you for your kind referral of Melvin Knight for consultation of the above symptoms. Although his history is well known to you, please allow me to reiterate it for the purpose of our medical record. He is alone in the office today. Records and images were personally reviewed where available. ? ? ?HISTORY OF PRESENT ILLNESS: ?This is a pleasant 41 year old right-handed man with a history of hypertension, alcohol use disorder, presenting for evaluation of weakness. The first episode occurred in 07/2021, he had played 3 hours of pickleball, had a half-cocktail then within 30 minutes he stood up and fell down due to leg weakness. There was no dizziness, numbness/tingling. He was able to drive home and stayed home for 2 days, mostly getting around the house using a stool with wheels, but when weakness progressed to where he had to use his upper body to pull himself up, he went to [facility] where BP was 162/118. His potassium level was <2.0, calcium 8.7, AST 128, ALT 75, CK 838 which trended up to 2453 during his admission. With potassium replacement, he started feeling better. 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  ? ?He was symptom-free for over 2 years and has been active several times without any issues. He had another episode of weakness 2 months ago while he was in the dining hall and could feel his upper extremities getting weak, he stood up and felt  like he was going to fall over. Weakness was diffuse, he went to the ER but after waiting 8 hours in triage, he left without being seen. He was able to stand but still felt weak 2 hours after arriving. A few weeks later, he was at the legislative building on 01/20/22 and again felt leg weakness, needing help walking. [facility] where potassium was normal 4.2, his magnesium level was low 1.1, B12 level very low at 70, EtOH level 33. He was given B12 supplementation and saw his PCP 2 days later with repeat B12 of 196. TSH normal. His most recent bloodwork done 02/08/22 showed a B12 of 263, CK, B1 and magnesium normal. HbA1c 5.7. He states that the weakness has been ongoing daily for the past 2 months with varying intensity, he would wake up fine then within 1-2 hours he starts getting weaker in his legs, slow to get up and walk, after his shower he feels he has to grab on to something. Weakness gets better as the day progresses, usually gone by 5-6pm. Today is the first day he feels normal. He feels his BP is getting better, it was up to 190/120, he checks it regularly, today it is 154/118, but in the evenings it goes down to 90/60. He has not noticed any focal weakness but people have told him he limps on his left leg, no falls. There are no clear triggers for recent episodes, he has not done strenuous exercise and does not have high carbohydrate meals. If he is standing still,  symptoms feel worse, but if he walks a little, it feels better. He denies any headaches, dizziness, diplopia, dysarthria/dysphagia, neck/back pain, bowel/bladder dysfunction. He has difficulty with sleep maintenance, getting 8-10 hours of sleep total but waking up after 4 hours. No family history of similar symptoms. For the past 10 years, he has been drinking 6-10 glasses of whiskey every night. He has noticed since the episode in February, he has been having an unexplained fear of heights where he has almost like a panic  feeling and has to hold on.  ? ? ?PAST MEDICAL HISTORY: ?Past Medical History:  ?Diagnosis Date  ? Hypertension   ? Hypokalemia   ? ? ?PAST SURGICAL HISTORY: ?History reviewed. No pertinent surgical history. ? ?MEDICATIONS: ?Current Outpatient Medications on File Prior to Visit  ?Medication Sig Dispense Refill  ? benazepril-hydrochlorthiazide (LOTENSIN HCT) 20-12.5 MG tablet Take 1 tablet by mouth 2 (two) times daily. 180 tablet 3  ? escitalopram (LEXAPRO) 5 MG tablet Take 1 tablet (5 mg total) by mouth daily. 30 tablet 3  ? Multiple Vitamins-Minerals (MULTIVITAMIN WITH MINERALS) tablet Take 1 tablet by mouth daily.    ? amLODipine (NORVASC) 2.5 MG tablet Take 1 tablet (2.5 mg total) by mouth daily. Take at night before bed. (Patient not taking: Reported on 02/08/2022) 30 tablet 1  ? nicotine (NICODERM CQ - DOSED IN MG/24 HOURS) 14 mg/24hr patch Place 1 patch (14 mg total) onto the skin daily. (Patient not taking: Reported on 02/23/2022) 28 patch 0  ? ?No current facility-administered medications on file prior to visit.  ? ? ?ALLERGIES: ?No Known Allergies ? ?FAMILY HISTORY: ?Family History  ?Problem Relation Age of Onset  ? Hypertension Father   ? Stroke Father   ? Cancer Sister   ? ? ?SOCIAL HISTORY: ?Social History  ? ?Socioeconomic History  ? Marital status: Single  ?  Spouse name: Not on file  ? Number of children: Not on file  ? Years of education: Not on file  ? Highest education level: Not on file  ?Occupational History  ? Not on file  ?Tobacco Use  ? Smoking status: Every Day  ?  Packs/day: 1.00  ?  Types: Cigarettes  ? Smokeless tobacco: Never  ?Vaping Use  ? Vaping Use: Never used  ?Substance and Sexual Activity  ? Alcohol use: Yes  ?  Alcohol/week: 6.0 standard drinks  ?  Types: 6 Shots of liquor per week  ?  Comment: 6 cocktails per day  ? Drug use: Not Currently  ? Sexual activity: Not Currently  ?Other Topics Concern  ? Not on file  ?Social History Narrative  ? Right handed   ? ?Social Determinants of  Health  ? ?Financial Resource Strain: Not on file  ?Food Insecurity: Not on file  ?Transportation Needs: Not on file  ?Physical Activity: Not on file  ?Stress: Not on file  ?Social Connections: Socially Isolated  ? Frequency of Communication with Friends and Family: Three times a week  ? Frequency of Social Gatherings with Friends and Family: More than three times a week  ? Attends Religious Services: Never  ? Active Member of Clubs or Organizations: No  ? Attends Banker Meetings: Never  ? Marital Status: Never married  ?Intimate Partner Violence: Not on file  ? ? ? ?PHYSICAL EXAM: ?Vitals:  ? 02/23/22 0827  ?BP: (!) 154/118  ?Pulse: 98  ?SpO2: 97%  ? ?General: No acute distress ?Head:  Normocephalic/atraumatic ?Skin/Extremities: No rash, no edema ?Neurological Exam: ?  Mental status: alert and oriented to person, place, and time, no dysarthria or aphasia, Fund of knowledge is appropriate.  Recent and remote memory are intact.  Attention and concentration are normal.     ?Cranial nerves: ?CN I: not tested ?CN II: pupils equal, round and reactive to light, visual fields intact ?CN III, IV, VI:  full range of motion, no nystagmus, no ptosis ?CN V: facial sensation intact ?CN VII: upper and lower face symmetric ?CN VIII: hearing intact to conversation ?Bulk & Tone: normal, no fasciculations. ?Motor: 5/5 throughout with no pronator drift. ?Sensation: intact to light touch, cold, pin, vibration sense.  No extinction to double simultaneous stimulation.  Romberg test negative ?Deep Tendon Reflexes: +2 throughout, no ankle clonus ?Plantar responses: downgoing bilaterally ?Cerebellar: no incoordination on finger to nose, heel to shin.  ?Gait: narrow-based and steady, able to tandem walk adequately. ?Tremor: none ? ? ?IMPRESSION: ?This is a pleasant 41 year old right-handed man with a history of hypertension, alcohol use disorder, presenting for evaluation of weakness. He had an episode of weakness lasting several  days in 2020, found to have a potassium level of <2.0. He did well for over 2 years until 12/2021 when he felt similar weakness that persisted and became worse on 01/20/2022 but potassium was normal. His

## 2022-03-03 ENCOUNTER — Telehealth: Payer: Self-pay

## 2022-03-03 NOTE — Telephone Encounter (Signed)
Copied from CRM [id number]. Topic: General - Other >> Mar 03, 2022  2:43 PM Elliot Gault wrote: Reason for CRM: Patient employer [employer] is requesting a Dr. Note reflecting he is okay to drive.  Patient states please send letter through My Chart

## 2022-03-05 ENCOUNTER — Encounter: Payer: Self-pay | Admitting: Internal Medicine

## 2022-03-05 LAB — METHYLMALONIC ACID, SERUM: Methylmalonic Acid, Quant: 172 nmol/L (ref 87–318)

## 2022-03-05 LAB — MYASTHENIA GRAVIS PANEL 1
A CHR BINDING ABS: <0.3 nmol/L
STRIATED MUSCLE AB SCREEN: NEGATIVE

## 2022-03-08 ENCOUNTER — Ambulatory Visit: Payer: BC Managed Care – PPO | Admitting: Internal Medicine

## 2022-03-08 ENCOUNTER — Encounter: Payer: Self-pay | Admitting: Internal Medicine

## 2022-03-08 ENCOUNTER — Other Ambulatory Visit: Payer: Self-pay

## 2022-03-08 VITALS — BP 132/88 | HR 100 | Temp 98.6°F | Ht 67.0 in | Wt 198.0 lb

## 2022-03-08 DIAGNOSIS — F439 Reaction to severe stress, unspecified: Secondary | ICD-10-CM

## 2022-03-08 DIAGNOSIS — E538 Deficiency of other specified B group vitamins: Secondary | ICD-10-CM

## 2022-03-08 DIAGNOSIS — F1020 Alcohol dependence, uncomplicated: Secondary | ICD-10-CM

## 2022-03-08 DIAGNOSIS — I10 Essential (primary) hypertension: Secondary | ICD-10-CM | POA: Diagnosis not present

## 2022-03-08 DIAGNOSIS — R531 Weakness: Secondary | ICD-10-CM

## 2022-03-08 MED ORDER — CYANOCOBALAMIN 1000 MCG/ML IJ SOLN
1000.0000 ug | Freq: Once | INTRAMUSCULAR | Status: AC
  Administered 2022-03-08: 1000 ug via INTRAMUSCULAR

## 2022-03-08 NOTE — Assessment & Plan Note (Signed)
Appears to be resolving ? Seen by Neurology, planning to see neuromuscular specialist in September. Vitamin B12 levels low, taking multivitamin, will give IM injection today. Discussed receiving a note for work clearing him to drive, which unfortunately I cannot do at this time as etiology is still unclear and symptoms are still occuring.  ?

## 2022-03-08 NOTE — Assessment & Plan Note (Signed)
Continue to work on decreasing alcohol, weakness appears to be improving with this change. Continue multivitamin, will continue to pursue counseling.  ?

## 2022-03-08 NOTE — Assessment & Plan Note (Signed)
Stopped SSRI due to side effects, wants to pursue counseling instead, reached out to social work.  ?

## 2022-03-08 NOTE — Assessment & Plan Note (Signed)
Blood pressure better controlled overall but appears to be very labile. Currently taking Benazepril-HCTZ 20-12.5 BID in the morning and evenings, change to morning and lunchtime dosing to prevent lows in the evenings. Continue to monitor blood pressure closely at home.  ?

## 2022-03-08 NOTE — Patient Instructions (Signed)
It was great seeing you today! ? ?Plan discussed at today's visit: ?-Vitamin B12 injection today ?-Take medication both pills at the same time or one right when you wake up and one at lunch time, stop evening dose ?-Continue to monitor blood pressures  ? ?Follow up in: 2 months  ? ?Take care and let us know if you have any questions or concerns prior to your next visit. ? ?Dr. Caralee Ates ? ?

## 2022-03-08 NOTE — Progress Notes (Signed)
? ?Established Patient Office Visit ? ?Subjective:  ?Patient ID: Melvin Knight, male    DOB: 04-11-1981  Age: 41 y.o. MRN: [id number] ? ?CC:  ?Chief Complaint  ?Patient presents with  ? Consult  ?  Need letter to drive  ? Hypertension  ? ? ?HPI ?Melvin Knight presents for follow up on HTN and weakness. ? ?Hypertension: ?-Medications: Benazepril-HCTZ 20-12.5 BID, Amlodipine added 2.5 at night but he stopped taking this ( no side effects just not taking) ?-Checking BP at home (average): 175/120 was the highest its been since last here, this was last week. Average 130-150/80-90. Has been low to 90/60 usually in the evening.  ?-Denies any SOB, CP, vision changes, LE edema or symptoms of hypotension. Continues to endorse generalized weakness although this has decreased in severity and in frequency. ? ?Elevated LFTs/Alcohol Use Disorder: ?-CMP 01/20/2022 with ALT 59, AST 64 ?-Discussed with community care, in the process of setting up with a counselor ?-Still drinking 5-6 mixed drinks a day on most days, trying to cut down ? ?Weakness: ?-Intermittent for about 3 months now  ?-Went to the ER on 01/20/22 for weakness. Labs revealed mag of 1.1, Vitamin B12 low and both supplemented. ?-Taking multivitamin with Vitamin B12 in it  ?-Seen by Neuro who ordered EMG and plans to follow up with neuromuscular specialist in 9/23  ?  ?Stress/MDD: ?-Mood status: stable ?-Current treatment: stopped Lexapro - felt more fatigued, no motivation so he stopped the medication  ?-Setting up counseling  ? ? ?  03/08/2022  ?  8:05 AM 02/08/2022  ?  8:13 AM 01/22/2022  ? 11:22 AM 01/22/2022  ? 11:19 AM 01/11/2022  ?  8:17 AM  ?Depression screen PHQ 2/9  ?Decreased Interest _0 ?Down, Depressed, Hopeless _1 ?PHQ - 2 Score _2 ?Altered sleeping 0 0 1  0  ?Tired, decreased energy 0 0 2  0  ?Change in appetite 0 0 1  0  ?Feeling bad or failure about yourself  0 0 2  0  ?Trouble concentrating 0 0 0  0  ?Moving slowly or fidgety/restless 0 0 2   0  ?Suicidal thoughts 0 0 0  0  ?PHQ-9 Score _3 ?Difficult doing work/chores Not difficult at all Not difficult at all Somewhat difficult  Not difficult at all  ? ? ?Past Medical History:  ?Diagnosis Date  ? Hypertension   ? Hypokalemia   ? ? ?No past surgical history on file. ? ?Family History  ?Problem Relation Age of Onset  ? Hypertension Father   ? Stroke Father   ? Cancer Sister   ? ? ?Social History  ? ?Socioeconomic History  ? Marital status: Single  ?  Spouse name: Not on file  ? Number of children: Not on file  ? Years of education: Not on file  ? Highest education level: Not on file  ?Occupational History  ? Not on file  ?Tobacco Use  ? Smoking status: Every Day  ?  Packs/day: 1.00  ?  Types: Cigarettes  ? Smokeless tobacco: Never  ?Vaping Use  ? Vaping Use: Never used  ?Substance and Sexual Activity  ? Alcohol use: Yes  ?  Alcohol/week: 6.0 standard drinks  ?  Types: 6 Shots of liquor per week  ?  Comment: 6 cocktails per day  ? Drug use: Not Currently  ? Sexual activity: Not Currently  ?Other  Topics Concern  ? Not on file  ?Social History Narrative  ? Right handed   ? ?Social Determinants of Health  ? ?Financial Resource Strain: Not on file  ?Food Insecurity: Not on file  ?Transportation Needs: Not on file  ?Physical Activity: Not on file  ?Stress: Not on file  ?Social Connections: Socially Isolated  ? Frequency of Communication with Friends and Family: Three times a week  ? Frequency of Social Gatherings with Friends and Family: More than three times a week  ? Attends Religious Services: Never  ? Active Member of Clubs or Organizations: No  ? Attends Archivist Meetings: Never  ? Marital Status: Never married  ?Intimate Partner Violence: Not on file  ? ? ?Outpatient Medications Prior to Visit  ?Medication Sig Dispense Refill  ? amLODipine (NORVASC) 2.5 MG tablet Take 1 tablet (2.5 mg total) by mouth daily. Take at night before bed. (Patient not taking: Reported on 02/08/2022) 30 tablet  1  ? benazepril-hydrochlorthiazide (LOTENSIN HCT) 20-12.5 MG tablet Take 1 tablet by mouth 2 (two) times daily. 180 tablet 3  ? escitalopram (LEXAPRO) 5 MG tablet Take 1 tablet (5 mg total) by mouth daily. 30 tablet 3  ? Multiple Vitamins-Minerals (MULTIVITAMIN WITH MINERALS) tablet Take 1 tablet by mouth daily.    ? nicotine (NICODERM CQ - DOSED IN MG/24 HOURS) 14 mg/24hr patch Place 1 patch (14 mg total) onto the skin daily. (Patient not taking: Reported on 02/23/2022) 28 patch 0  ? ?No facility-administered medications prior to visit.  ? ? ?No Known Allergies ? ?ROS ?Review of Systems  ?Constitutional:  Positive for fatigue. Negative for chills and fever.  ?Eyes:  Negative for visual disturbance.  ?Respiratory:  Negative for cough and shortness of breath.   ?Cardiovascular:  Negative for chest pain.  ?Genitourinary:  Negative for dysuria and hematuria.  ?Neurological:  Positive for weakness. Negative for dizziness, light-headedness, numbness and headaches.  ? ?  ?Objective:  ?  ?Physical Exam ?Constitutional:   ?   Appearance: Normal appearance.  ?HENT:  ?   Head: Normocephalic and atraumatic.  ?Eyes:  ?   Conjunctiva/sclera: Conjunctivae normal.  ?Cardiovascular:  ?   Rate and Rhythm: Normal rate and regular rhythm.  ?Pulmonary:  ?   Effort: Pulmonary effort is normal.  ?   Breath sounds: Normal breath sounds.  ?Musculoskeletal:     ?   General: No tenderness.  ?   Right lower leg: No edema.  ?   Left lower leg: No edema.  ?Skin: ?   General: Skin is warm and dry.  ?Neurological:  ?   General: No focal deficit present.  ?   Mental Status: He is alert. Mental status is at baseline.  ?Psychiatric:     ?   Mood and Affect: Mood normal.     ?   Behavior: Behavior normal.  ? ? ?BP 132/88   Pulse 100   Temp 98.6 ?F (37 ?C) (Oral)   Ht _0  (1.702 m)   Wt 198 lb (89.8 kg)   SpO2 98%   BMI 31.01 kg/m?  ?Wt Readings from Last 3 Encounters:  ?03/08/22 198 lb (89.8 kg)  ?02/23/22 195 lb (88.5 kg)  ?02/08/22 197 lb  11.2 oz (89.7 kg)  ? ? ? ?Health Maintenance Due  ?Topic Date Due  ? COVID-19 Vaccine (2 - Booster for Janssen series) 03/29/2020  ? ? ?There are no preventive care reminders to display for this patient. ? ?Lab Results  ?  Component Value Date  ? TSH 1.15 01/22/2022  ? ?Lab Results  ?Component Value Date  ? WBC 4.2 02/08/2022  ? HGB 14.7 02/08/2022  ? HCT 41.7 02/08/2022  ? MCV 100.2 (H) 02/08/2022  ? PLT 199 02/08/2022  ? ?Lab Results  ?Component Value Date  ? NA 144 02/08/2022  ? K 3.7 02/08/2022  ? CO2 26 02/08/2022  ? GLUCOSE 109 (H) 02/08/2022  ? BUN 22 02/08/2022  ? CREATININE 0.88 02/08/2022  ? BILITOT 0.9 02/08/2022  ? ALKPHOS 93 11/30/2021  ? AST 60 (H) 02/08/2022  ? ALT 58 (H) 02/08/2022  ? PROT 7.6 02/08/2022  ? ALBUMIN 4.9 11/30/2021  ? CALCIUM 9.4 02/08/2022  ? ANIONGAP 8 11/30/2021  ? EGFR 116 01/11/2022  ? ?Lab Results  ?Component Value Date  ? CHOL 258 (H) 01/11/2022  ? ?Lab Results  ?Component Value Date  ? HDL 61 01/11/2022  ? ?Lab Results  ?Component Value Date  ? LDLCALC 166 (H) 01/11/2022  ? ?Lab Results  ?Component Value Date  ? TRIG 162 (H) 01/11/2022  ? ?Lab Results  ?Component Value Date  ? CHOLHDL 4.2 01/11/2022  ? ?Lab Results  ?Component Value Date  ? HGBA1C 5.7 (H) 02/08/2022  ? ? ?  ?Assessment & Plan:  ? ?Problem List Items Addressed This Visit   ? ?  ? Cardiovascular and Mediastinum  ? HTN (hypertension) - Primary  ?  Blood pressure better controlled overall but appears to be very labile. Currently taking Benazepril-HCTZ 20-12.5 BID in the morning and evenings, change to morning and lunchtime dosing to prevent lows in the evenings. Continue to monitor blood pressure closely at home.  ? ?  ?  ?  ? Other  ? Alcohol use disorder, moderate, dependence ([place])  ?  Continue to work on decreasing alcohol, weakness appears to be improving with this change. Continue multivitamin, will continue to pursue counseling.  ? ?  ?  ? Stress  ?  Stopped SSRI due to side effects, wants to pursue counseling  instead, reached out to social work.  ? ?  ?  ? Weakness  ?  Appears to be resolving ? Seen by Neurology, planning to see neuromuscular specialist in September. Vitamin B12 levels low, taking multivitamin, wil

## 2022-03-09 ENCOUNTER — Ambulatory Visit: Payer: Self-pay | Admitting: *Deleted

## 2022-03-09 ENCOUNTER — Encounter: Payer: Self-pay | Admitting: *Deleted

## 2022-03-09 DIAGNOSIS — F1020 Alcohol dependence, uncomplicated: Secondary | ICD-10-CM

## 2022-03-09 DIAGNOSIS — F439 Reaction to severe stress, unspecified: Secondary | ICD-10-CM

## 2022-03-09 NOTE — Chronic Care Management (AMB) (Signed)
Care Management ?Clinical Social Work Note ? ?03/09/2022 ?Name: Melvin Knight MRN: [id number] DOB: [birth year] ? ?Melvin Knight is a 41 y.o. year old male who is a primary care patient of Melvin Medici, DO.  The Care Management team was consulted for assistance with chronic disease management and coordination needs. ? ?Engaged with patient by telephone for follow up visit in response to provider referral for social work chronic care management and care coordination services ? ?Consent to Services:  ?Melvin Knight was given information about Care Management services today including:  ?Care Management services includes personalized support from designated clinical staff supervised by his physician, including individualized plan of care and coordination with other care providers ?24/7 contact phone numbers for assistance for urgent and routine care needs. ?The patient may stop case management services at any time by phone call to the office staff. ? ?Patient agreed to services and consent obtained.  ? ?Assessment: Review of patient past medical history, allergies, medications, and health status, including review of relevant consultants reports was performed today as part of a comprehensive evaluation and provision of chronic care management and care coordination services. ? ?SDOH (Social Determinants of Health) assessments and interventions performed:   ? ?Advanced Directives Status: Not addressed in this encounter. ? ?Care Plan ? ?No Known Allergies ? ?Outpatient Encounter Medications as of 03/09/2022  ?Medication Sig  ? benazepril-hydrochlorthiazide (LOTENSIN HCT) 20-12.5 MG tablet Take 1 tablet by mouth 2 (two) times daily.  ? Multiple Vitamins-Minerals (MULTIVITAMIN WITH MINERALS) tablet Take 1 tablet by mouth daily.  ? ?No facility-administered encounter medications on file as of 03/09/2022.  ? ? ?Patient Active Problem List  ? Diagnosis Date Noted  ? Weakness 02/08/2022  ? Stress 01/22/2022  ? Alcohol use disorder,  moderate, dependence ([place]) 01/11/2022  ? HTN (hypertension) 12/15/2021  ? Elevated LFTs 12/15/2021  ? 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 External hemorrhoids 03/31/2015  ? ? ?Conditions to be addressed/monitored:  ? Anxiety and stress ; Mental Health Concerns  ? ?Care Plan : General Social Work (Adult)  ?Updates made by Vern Claude, LCSW since 03/09/2022 12:00 AM  ?  ? ?Problem: [facility] AMB "PATIENT-SPECIFIC PROBLEM"   ?Note:   ?CARE PLAN ENTRY ?(see longitudinal plan of care for additional care plan information) ? ?Current Barriers:  ?Knowledge deficits related to accessing mental health provider in patient with  stress   ?Patient is experiencing symptoms of  increased stress which seem to be exacerbated by increased work demands and family responsibilities.     ?Patient needs Support, Education, and Care Coordination in order to meet unmet mental health needs  ?Mental Health Concerns  ? ?Clinical Social Work Goal(s):  ?Over the next 90 days, patient will work with SW bi-weekly by telephone or in person to reduce or manage symptoms of stress until connected for ongoing counseling resources.  ?Patient will implement clinical interventions discussed today to decreases symptoms of stress and increase knowledge and/or ability of: coping skills. ? ?Interventions:  ?Assessed patient's understanding, education, previous treatment and care coordination needs  ?Patient discussed concerns with continued weakness, now affecting his functioning ?Patient confirmed inability to identify a trigger to episodes of weakness-primary care doctor contacted to discuss treatment options ?Provided basic emotional support and interventions  ?Encouraged verbalization of feelings ?Positive coping strategies explored, self care emphasized ?Followed up on long term counseling referral. [facility] referral completed for ongoing mental health counseling appointment scheduled for 02/10/22 ?Other interventions include:  Solution-Focused Strategies employed:  ?Active listening / Reflection  utilized  ?Participation in counseling encouraged   ?03/09/22 Follow up phone call to patient to discuss missed tele-health appointment with [facility] for ongoing mental health counseling on 02/10/22 at 9:30am. Patient stated that he had the appointment written down but missed it. Patient agreeable to re-scheduling. Contact information provided. The benefits/Importance of re-scheduling the appointment discussed especially due to recent changes in his life circumstances. ? ?Patient Self Care Activities & Deficits:  ?Patient is unable to independently navigate community resource options without care coordination support ?Patient is able to implement clinical interventions discussed today and is motivated for treatment  ?Patient will select one of the agencies from the list provided and call to schedule an appointment  ?Performs ADL's independently ?Performs IADL's independently ?Motivation for treatment ? ?Please see past updates related to this goal by clicking on the "Past Updates" button in the selected goal  ? ?  ?  ? ?Follow Up Plan: Client will contact [facility] to re-schedule initial intake appointment which was originally scheduled for 02/10/22 ? ? ?Syon Tews, LCSW ?Clinical Social Worker  ?[facility] Care Management ?[phone] ? ?

## 2022-03-09 NOTE — Progress Notes (Signed)
This encounter was created in error - please disregard.

## 2022-03-09 NOTE — Patient Instructions (Signed)
Visit Information ? ?Thank you for taking time to visit with me today. Please don't hesitate to contact me if I can be of assistance to you before our next scheduled telephone appointment. ? ?Following are the goals we discussed today:  ? ?- begin personal counseling-initial appointment scheduled for 02/10/22(patient agreeable to re-scheduling this appointment) ?- talk about feelings with a friend, family or spiritual advisor ?- practice positive thinking and self-talk  ? ?If you are experiencing a Mental Health or Behavioral Health Crisis or need someone to talk to, please call the Suicide and Crisis Lifeline: 988  ? ?Patient verbalizes understanding of instructions and care plan provided today and agrees to view in MyChart. Active MyChart status confirmed with patient.   ? ?No further follow up required: patient will contact [facility] to re-schedule initial intake appointment for mental health counseling ? ?Dmoni Fortson, LCSW ?Clinical Social Worker  ?[facility] Care Management ?[phone] ? ?

## 2022-03-12 ENCOUNTER — Encounter: Payer: Self-pay | Admitting: Internal Medicine

## 2022-03-17 ENCOUNTER — Ambulatory Visit: Payer: BC Managed Care – PPO | Admitting: Internal Medicine

## 2022-03-22 ENCOUNTER — Telehealth: Payer: Self-pay | Admitting: Neurology

## 2022-03-22 NOTE — Telephone Encounter (Signed)
Patient was returning a phone call 

## 2022-03-22 NOTE — Telephone Encounter (Signed)
Pt stated that he has not heard back about his referral. [facility] it to [facility] will call to follow up on them getting the referral ?

## 2022-05-09 ENCOUNTER — Encounter: Payer: Self-pay | Admitting: Internal Medicine

## 2022-05-10 ENCOUNTER — Ambulatory Visit: Payer: BC Managed Care – PPO | Admitting: Family Medicine

## 2022-05-10 ENCOUNTER — Encounter: Payer: Self-pay | Admitting: Family Medicine

## 2022-05-10 VITALS — BP 124/68 | HR 100 | Temp 98.8°F | Resp 16 | Ht 67.0 in | Wt 195.1 lb

## 2022-05-10 DIAGNOSIS — F1020 Alcohol dependence, uncomplicated: Secondary | ICD-10-CM

## 2022-05-10 DIAGNOSIS — R7989 Other specified abnormal findings of blood chemistry: Secondary | ICD-10-CM

## 2022-05-10 DIAGNOSIS — I10 Essential (primary) hypertension: Secondary | ICD-10-CM

## 2022-05-10 DIAGNOSIS — G723 Periodic paralysis: Secondary | ICD-10-CM | POA: Diagnosis not present

## 2022-05-10 DIAGNOSIS — R531 Weakness: Secondary | ICD-10-CM | POA: Diagnosis not present

## 2022-05-10 DIAGNOSIS — F439 Reaction to severe stress, unspecified: Secondary | ICD-10-CM

## 2022-05-10 MED ORDER — BENAZEPRIL-HYDROCHLOROTHIAZIDE 20-12.5 MG PO TABS: 1.0000 | ORAL_TABLET | Freq: Every day | ORAL | 3 refills | Status: DC

## 2022-05-10 NOTE — Assessment & Plan Note (Signed)
Due to alcohol use. Recheck labs today.

## 2022-05-10 NOTE — Assessment & Plan Note (Signed)
Congratulated efforts on cutting down, continue. Recommend establishing with [facility], contact info provided.

## 2022-05-10 NOTE — Assessment & Plan Note (Signed)
Mood stable, encourage establishing with counselor.

## 2022-05-10 NOTE — Assessment & Plan Note (Signed)
Currently at goal with once daily dosing, continue. Recommend keep log at home and f/u in 2 months with log. Obtaining labs today.

## 2022-05-10 NOTE — Progress Notes (Signed)
   SUBJECTIVE:   CHIEF COMPLAINT / HPI:   Hypertension: - Medications: benazepril-HCTZ 20-12.5mg  BID in am and lunch - Compliance: taking once per day in morning.  - Checking BP at home: hasn't checked in a month - Denies any SOB, CP, vision changes, LE edema, medication SEs, or symptoms of hypotension  Alcohol use/Elevated LFTs - has cut back, 4-5 drinks (whiskey) per night - denies tremors.  - missed appt with [facility], states is the most busy time at work. Schedule should lighten in about a month. Mood is unchanged.   Weakness: - saw [facility] 5/10 with negative genetic panel. Thought weakness secondary to low potassium and alcohol use. Has neuromuscular specialist follow up in September.  - is improving, hasn't had any episodes in 4 days, even went [place] [facility] on Saturday and walked entire park. - received B12 injection at last visit 2 months ago.  - taking multivitamin. Was previously on potassium supplement, not taking currently.    OBJECTIVE:   BP 124/68   Pulse 100   Temp 98.8 F (37.1 C) (Oral)   Resp 16   Ht 5\' 7"  (1.702 m)   Wt 195 lb 1.6 oz (88.5 kg)   SpO2 95%   BMI 30.56 kg/m   Gen: well appearing, in NAD Card: Reg rate Lungs: Comfortable WOB on RA Ext: WWP, no edema. No tremors.    ASSESSMENT/PLAN:   HTN (hypertension) Currently at goal with once daily dosing, continue. Recommend keep log at home and f/u in 2 months with log. Obtaining labs today.  Weakness Per patient report, genetic testing normal with recent neuro visit, no records available for review in Care Everywhere, will obtain. Thought 2/2 hypokalemia and alcohol use, congratulated efforts on cutting down. Recommend reestablishing with [facility] for mood and alcohol use d/o, contact info provided. Obtaining labs today. Maintain f/u with NM specialist. F/u in 2 months.  Alcohol use disorder, moderate, dependence ([facility]) Congratulated efforts on cutting down, continue. Recommend establishing with [facility], contact  info provided.   Elevated LFTs Due to alcohol use. Recheck labs today.  Stress Mood stable, encourage establishing with counselor.      , DO

## 2022-05-10 NOTE — Assessment & Plan Note (Signed)
Per patient report, genetic testing normal with recent neuro visit, no records available for review in Care Everywhere, will obtain. Thought 2/2 hypokalemia and alcohol use, congratulated efforts on cutting down. Recommend reestablishing with [facility] for mood and alcohol use d/o, contact info provided. Obtaining labs today. Maintain f/u with NM specialist. F/u in 2 months.

## 2022-05-10 NOTE — Patient Instructions (Signed)
It was great to see you!  Our plans for today:  - Call [facility] for an appointment - [phone] - We are getting your records from your recent Neurology appointment. Keep your appointment with Dr. Allena Katz in September.  - Continue to take your blood pressure medicine once per day and keep a record of your blood pressures. If the top number is consistently above 150, let us know.  - Come back in 2 months.  We are checking some labs today, we will release these results to your MyChart.  Take care and seek immediate care sooner if you develop any concerns.   Dr. Linwood Dibbles

## 2022-05-11 LAB — COMPREHENSIVE METABOLIC PANEL
AG Ratio: 1.7 (calc) (ref 1.0–2.5)
ALT: 79 U/L — ABNORMAL HIGH (ref 9–46)
AST: 62 U/L — ABNORMAL HIGH (ref 10–40)
Albumin: 4.7 g/dL (ref 3.6–5.1)
Alkaline phosphatase (APISO): 87 U/L (ref 36–130)
BUN/Creatinine Ratio: 25 (calc) — ABNORMAL HIGH (ref 6–22)
BUN: 34 mg/dL — ABNORMAL HIGH (ref 7–25)
CO2: 24 mmol/L (ref 20–32)
Calcium: 9 mg/dL (ref 8.6–10.3)
Chloride: 102 mmol/L (ref 98–110)
Creat: 1.34 mg/dL — ABNORMAL HIGH (ref 0.60–1.26)
Globulin: 2.8 g/dL (calc) (ref 1.9–3.7)
Glucose, Bld: 98 mg/dL (ref 65–99)
Potassium: 3.2 mmol/L — ABNORMAL LOW (ref 3.5–5.3)
Sodium: 139 mmol/L (ref 135–146)
Total Bilirubin: 0.7 mg/dL (ref 0.2–1.2)
Total Protein: 7.5 g/dL (ref 6.1–8.1)

## 2022-05-11 LAB — MAGNESIUM: Magnesium: 1.8 mg/dL (ref 1.5–2.5)

## 2022-05-11 LAB — VITAMIN B12: Vitamin B-12: 296 pg/mL (ref 200–1100)

## 2022-07-05 ENCOUNTER — Other Ambulatory Visit: Payer: Self-pay | Admitting: Internal Medicine

## 2022-07-06 ENCOUNTER — Other Ambulatory Visit: Payer: Self-pay

## 2022-07-06 MED ORDER — MULTI-VITAMIN/MINERALS PO TABS
1.0000 | ORAL_TABLET | Freq: Every day | ORAL | 3 refills | Status: AC
  Filled 2022-07-06: qty 90, 90d supply, fill #0

## 2022-07-16 ENCOUNTER — Encounter: Payer: Self-pay | Admitting: Internal Medicine

## 2022-07-16 ENCOUNTER — Ambulatory Visit: Payer: BC Managed Care – PPO | Admitting: Internal Medicine

## 2022-07-16 VITALS — BP 136/82 | HR 92 | Resp 16 | Ht 67.0 in | Wt 198.0 lb

## 2022-07-16 DIAGNOSIS — F1020 Alcohol dependence, uncomplicated: Secondary | ICD-10-CM

## 2022-07-16 DIAGNOSIS — I10 Essential (primary) hypertension: Secondary | ICD-10-CM | POA: Diagnosis not present

## 2022-07-16 DIAGNOSIS — R748 Abnormal levels of other serum enzymes: Secondary | ICD-10-CM | POA: Diagnosis not present

## 2022-07-16 DIAGNOSIS — F419 Anxiety disorder, unspecified: Secondary | ICD-10-CM

## 2022-07-16 DIAGNOSIS — E876 Hypokalemia: Secondary | ICD-10-CM

## 2022-07-16 MED ORDER — ESCITALOPRAM OXALATE 5 MG PO TABS: 5.0000 mg | ORAL_TABLET | Freq: Every day | ORAL | 1 refills | Status: DC

## 2022-07-16 MED ORDER — HYDROXYZINE HCL 10 MG PO TABS: 10.0000 mg | ORAL_TABLET | ORAL | 0 refills | Status: DC | PRN
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
## 2022-07-16 NOTE — Patient Instructions (Signed)
It was great seeing you today!  Plan discussed at today's visit: -Blood work ordered today, results will be uploaded to MyChart.  -Start Lexapro 5 mg, Hydroxyzine as needed for anxiety but may make you tired  Follow up in: 6 weeks   Take care and let us know if you have any questions or concerns prior to your next visit.  Dr. Caralee Ates

## 2022-07-17 LAB — COMPLETE METABOLIC PANEL WITH GFR
AG Ratio: 1.7 (calc) (ref 1.0–2.5)
ALT: 77 U/L — ABNORMAL HIGH (ref 9–46)
AST: 77 U/L — ABNORMAL HIGH (ref 10–40)
Albumin: 4.8 g/dL (ref 3.6–5.1)
Alkaline phosphatase (APISO): 91 U/L (ref 36–130)
BUN: 17 mg/dL (ref 7–25)
CO2: 21 mmol/L (ref 20–32)
Calcium: 9.5 mg/dL (ref 8.6–10.3)
Chloride: 105 mmol/L (ref 98–110)
Creat: 1.22 mg/dL (ref 0.60–1.26)
Globulin: 2.8 g/dL (calc) (ref 1.9–3.7)
Glucose, Bld: 93 mg/dL (ref 65–99)
Potassium: 3.9 mmol/L (ref 3.5–5.3)
Sodium: 142 mmol/L (ref 135–146)
Total Bilirubin: 0.6 mg/dL (ref 0.2–1.2)
Total Protein: 7.6 g/dL (ref 6.1–8.1)
eGFR: 78 mL/min/{1.73_m2} (ref >=60)

## 2022-07-19 ENCOUNTER — Ambulatory Visit: Payer: BC Managed Care – PPO | Admitting: Neurology

## 2022-07-19 NOTE — Addendum Note (Signed)
Addended by: Margarita Mail on: 07/19/2022 08:28 AM   Modules accepted: Orders

## 2022-08-06 ENCOUNTER — Ambulatory Visit
Admission: RE | Admit: 2022-08-06 | Discharge: 2022-08-06 | Disposition: A | Discharge status: Home / Self Care | Payer: BC Managed Care – PPO | Source: Ambulatory Visit | Attending: Internal Medicine | Admitting: Internal Medicine

## 2022-08-06 DIAGNOSIS — R748 Abnormal levels of other serum enzymes: Secondary | ICD-10-CM | POA: Insufficient documentation

## 2022-08-06 DIAGNOSIS — F1020 Alcohol dependence, uncomplicated: Secondary | ICD-10-CM | POA: Diagnosis present

## 2022-08-09 ENCOUNTER — Ambulatory Visit: Payer: BC Managed Care – PPO | Admitting: Neurology

## 2022-08-09 ENCOUNTER — Encounter: Payer: Self-pay | Admitting: Neurology

## 2022-08-09 DIAGNOSIS — Z029 Encounter for administrative examinations, unspecified: Secondary | ICD-10-CM

## 2022-08-15 ENCOUNTER — Other Ambulatory Visit: Payer: Self-pay | Admitting: Internal Medicine

## 2022-08-15 DIAGNOSIS — F419 Anxiety disorder, unspecified: Secondary | ICD-10-CM

## 2022-08-16 NOTE — Telephone Encounter (Signed)
Requested Prescriptions  Pending Prescriptions Disp Refills  . hydrOXYzine (ATARAX) 10 MG tablet [Pharmacy Med Name: HYDROXYZINE HCL 10 MG TABLET] 90 tablet 0    Sig: TAKE 1 TABLET (10 MG TOTAL) BY MOUTH AS NEEDED FOR ANXIETY.     Ear, Nose, and Throat:  Antihistamines 2 Passed - 08/15/2022  8:25 AM      Passed - Cr in normal range and within 360 days    Creat  Date Value Ref Range Status  07/16/2022 1.22 0.60 - 1.26 mg/dL Final         Passed - Valid encounter within last 12 months    Recent Outpatient Visits          1 month ago [place], DO   3 months ago Weakness   [place], DO   5 months ago Hypertension, unspecified type   [facility] [name], DO   6 months ago Weakness   [facility] Rory Percy M, DO   6 months ago Weakness   [facility] [name], DO      Future Appointments            In 1 week [name], [facility], [facility]

## 2022-08-19 ENCOUNTER — Encounter: Payer: Self-pay | Admitting: Internal Medicine

## 2022-08-19 DIAGNOSIS — R200 Anesthesia of skin: Secondary | ICD-10-CM

## 2022-08-19 DIAGNOSIS — G723 Periodic paralysis: Secondary | ICD-10-CM

## 2022-08-19 DIAGNOSIS — R531 Weakness: Secondary | ICD-10-CM

## 2022-08-23 ENCOUNTER — Other Ambulatory Visit: Payer: Self-pay | Admitting: Internal Medicine

## 2022-08-27 ENCOUNTER — Encounter: Payer: Self-pay | Admitting: Internal Medicine

## 2022-08-27 ENCOUNTER — Ambulatory Visit: Payer: BC Managed Care – PPO | Admitting: Internal Medicine

## 2022-08-27 VITALS — BP 132/80 | HR 104 | Temp 98.7°F | Resp 16 | Wt 195.6 lb

## 2022-08-27 DIAGNOSIS — F419 Anxiety disorder, unspecified: Secondary | ICD-10-CM | POA: Diagnosis not present

## 2022-08-27 DIAGNOSIS — R531 Weakness: Secondary | ICD-10-CM | POA: Diagnosis not present

## 2022-08-27 DIAGNOSIS — I10 Essential (primary) hypertension: Secondary | ICD-10-CM

## 2022-08-27 DIAGNOSIS — Z23 Encounter for immunization: Secondary | ICD-10-CM | POA: Diagnosis not present

## 2022-08-27 DIAGNOSIS — R21 Rash and other nonspecific skin eruption: Secondary | ICD-10-CM | POA: Diagnosis not present

## 2022-08-27 MED ORDER — ESCITALOPRAM OXALATE 10 MG PO TABS: 10.0000 mg | ORAL_TABLET | Freq: Every day | ORAL | 0 refills | Status: DC

## 2022-08-27 MED ORDER — HYDROCORTISONE 1 % EX OINT: 1.0000 | TOPICAL_OINTMENT | Freq: Two times a day (BID) | CUTANEOUS | 0 refills | Status: DC

## 2022-08-27 NOTE — Progress Notes (Signed)
Established Patient Office Visit  Subjective:  Patient ID: Melvin Knight, Melvin Knight    DOB: 1980/12/08  Age: 41 y.o. MRN: [id number]  CC:  Chief Complaint  Patient presents with   Follow-up   Anxiety    Has been feeling well   Fatigue    General Weakness now mainly in the afternoon    HPI Melvin Knight presents for follow up on anxiety and weakness.  Hypertension: -Medications: Benazepril-HCTZ 20-12.5 mg  -Checking BP at home (average): Average 130-150/80-90 -Denies any SOB, CP, vision changes, LE edema or symptoms of hypotension. Continues to endorse generalized weakness although this has decreased in severity and in frequency.  Elevated LFTs/Alcohol Use Disorder: -CMP 01/20/2022 with ALT 59, AST 64, repeat LFT's in June high as well  -Still drinking 5-6 mixed drinks a day on most days, trying to cut down -[facility] appointment not yet made, patient states he will call next month when his schedule is less stressful.  Weakness: -Intermittent for multiple months now, overall appears to be getting better but still present -Weakness episodes involve symmetrical weakness in both lower and upper extremities.  Does not occur any certain time of day, will "just turn on".  The weakness makes him feel like his legs/arms will give out.  Will occur at rest or with activity.  He also has tremors in his hands he is feeling weak. -Has had a history of low potassium and magnesium.  He is currently on a multivitamin and B complex vitamin -Seen by Neurology who ordered EMG and has plans to follow up with a new neuromuscular specialist at [facility] the patient states that his weakness has decreased since starting the Lexapro, however he feels like the medication is not lasting throughout the day and he is having weakness and tremors later in the afternoon, especially whenever he returns from work.   Stress/MDD: -Mood status: Improved-at her last office visit, we discussed how he had recently developed a fear  of heights. 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 He states that entering a tall building will make him feel anxious, shaking, sweating and short of breath like he is going to have a panic attack.  -Current treatment: Restarted Lexapro 5 mg at LOV, overall tolerating well, denies side effects.     08/27/2022    8:01 AM 07/16/2022    8:10 AM 05/10/2022    8:07 AM 03/08/2022    8:05 AM 02/08/2022    8:13 AM  Depression screen PHQ 2/9  Decreased Interest 0 0 0 1 1  Down, Depressed, Hopeless 0 0 0 1 1  PHQ - 2 Score 0 0 0 2 2  Altered sleeping 0 0 0 0 0  Tired, decreased energy 0 3 0 0 0  Change in appetite 0 0 0 0 0  Feeling bad or failure about yourself  0 0 0 0 0  Trouble concentrating 0 0 0 0 0  Moving slowly or fidgety/restless 0 0 0 0 0  Suicidal thoughts 0 0 0 0 0  PHQ-9 Score 0 3 0 2 2  Difficult doing work/chores Not difficult at all  Not difficult at all Not difficult at all Not difficult at all    RASH -new rash around his neck and on his arms as well as his lower abdomen over the last week or so.  Also has 2 larger lesions on his left thigh, which he thought were spider bites.  Both rashes  are itchy and slightly erythematous but not painful.  Rash does not appear to be spreading.  He has not tried any over-the-counter treatments.  He denies any change in detergents/soaps or personal care products, however he is outside frequently.   Past Medical History:  Diagnosis Date   Hypertension    Hypokalemia     No past surgical history on file.  Family History  Problem Relation Age of Onset   Hypertension Father    Stroke Father    Cancer Sister     Social History   Socioeconomic History   Marital status: Single    Spouse name: Not on file   Number of children: Not on file   Years of education: Not on file   Highest education level: Not on file  Occupational History   Not on file  Tobacco Use   Smoking status: Every Day     Packs/day: 1.00    Types: Cigarettes   Smokeless tobacco: Never  Vaping Use   Vaping Use: Never used  Substance and Sexual Activity   Alcohol use: Yes    Alcohol/week: 6.0 standard drinks of alcohol    Types: 6 Shots of liquor per week    Comment: 6 cocktails per day   Drug use: Not Currently   Sexual activity: Not Currently  Other Topics Concern   Not on file  Social History Narrative   Right handed    Social Determinants of Health   Financial Resource Strain: Not on file  Food Insecurity: Not on file  Transportation Needs: Not on file  Physical Activity: Not on file  Stress: Not on file  Social Connections: Socially Isolated (01/01/2022)   Social Connection and Isolation Panel [NHANES]    Frequency of Communication with Friends and Family: Three times a week    Frequency of Social Gatherings with Friends and Family: More than three times a week    Attends Religious Services: Never    Marine scientist or Organizations: No    Attends Archivist Meetings: Never    Marital Status: Never married  Human resources officer Violence: Not on file    Outpatient Medications Prior to Visit  Medication Sig Dispense Refill   benazepril-hydrochlorthiazide (LOTENSIN HCT) 20-12.5 MG tablet Take 1 tablet by mouth daily. 180 tablet 3   hydrOXYzine (ATARAX) 10 MG tablet TAKE 1 TABLET (10 MG TOTAL) BY MOUTH AS NEEDED FOR ANXIETY. 90 tablet 0   Multiple Vitamins-Minerals (MULTIVITAMIN WITH MINERALS) tablet Take 1 tablet by mouth daily. 90 tablet 3   escitalopram (LEXAPRO) 5 MG tablet Take 1 tablet (5 mg total) by mouth daily. 30 tablet 1   No facility-administered medications prior to visit.    No Known Allergies  ROS Review of Systems  Constitutional:  Positive for fatigue. Negative for chills and fever.  Eyes:  Negative for visual disturbance.  Respiratory:  Negative for cough and shortness of breath.   Cardiovascular:  Negative for chest pain.  Genitourinary:  Negative for  dysuria and hematuria.  Skin:  Positive for rash.  Neurological:  Positive for tremors and weakness. Negative for dizziness, light-headedness, numbness and headaches.  Psychiatric/Behavioral:  The patient is nervous/anxious.       Objective:    Physical Exam Constitutional:      Appearance: Normal appearance.  HENT:     Head: Normocephalic and atraumatic.  Eyes:     Conjunctiva/sclera: Conjunctivae normal.  Cardiovascular:     Rate and Rhythm: Normal rate and regular rhythm.  Pulmonary:     Effort: Pulmonary effort is normal.     Breath sounds: Normal breath sounds.  Musculoskeletal:     Right lower leg: No edema.     Left lower leg: No edema.  Skin:    General: Skin is warm and dry.     Comments: Fine erythematous rash around the neck of his collar, bilateral upper arms and lower abdomen.  It appears to be a healing spider bite on his left thigh, no drainage, no erythema or current signs of infection present.  Neurological:     General: No focal deficit present.     Mental Status: He is alert. Mental status is at baseline.  Psychiatric:        Attention and Perception: Attention normal.        Mood and Affect: Mood is anxious.        Behavior: Behavior normal.     BP 132/80   Pulse (!) 104   Temp 98.7 F (37.1 C) (Oral)   Resp 16   Wt 195 lb 9.6 oz (88.7 kg)   SpO2 94%   BMI 30.64 kg/m  Wt Readings from Last 3 Encounters:  08/27/22 195 lb 9.6 oz (88.7 kg)  07/16/22 198 lb (89.8 kg)  05/10/22 195 lb 1.6 oz (88.5 kg)     There are no preventive care reminders to display for this patient.   There are no preventive care reminders to display for this patient.  Lab Results  Component Value Date   TSH 1.15 01/22/2022   Lab Results  Component Value Date   WBC 4.2 02/08/2022   HGB 14.7 02/08/2022   HCT 41.7 02/08/2022   MCV 100.2 (H) 02/08/2022   PLT 199 02/08/2022   Lab Results  Component Value Date   NA 142 07/16/2022   K 3.9 07/16/2022   CO2 21  07/16/2022   GLUCOSE 93 07/16/2022   BUN 17 07/16/2022   CREATININE 1.22 07/16/2022   BILITOT 0.6 07/16/2022   ALKPHOS 93 11/30/2021   AST 77 (H) 07/16/2022   ALT 77 (H) 07/16/2022   PROT 7.6 07/16/2022   ALBUMIN 4.9 11/30/2021   CALCIUM 9.5 07/16/2022   ANIONGAP 8 11/30/2021   EGFR 78 07/16/2022   Lab Results  Component Value Date   CHOL 258 (H) 01/11/2022   Lab Results  Component Value Date   HDL 61 01/11/2022   Lab Results  Component Value Date   LDLCALC 166 (H) 01/11/2022   Lab Results  Component Value Date   TRIG 162 (H) 01/11/2022   Lab Results  Component Value Date   CHOLHDL 4.2 01/11/2022   Lab Results  Component Value Date   HGBA1C 5.7 (H) 02/08/2022      Assessment & Plan:   1. Anxiety: Appears to be better controlled and also helping his weakness, especially in the mornings.  We will increase Lexapro to 10 mg daily.  He still has his hydroxyzine to take as needed for anxiety.  He was able to schedule an appointment with a psychiatrist through his work and will be evaluated by them shortly. Follow up here in 6 weeks to recheck.  - escitalopram (LEXAPRO) 10 MG tablet; Take 1 tablet (10 mg total) by mouth daily.  Dispense: 90 tablet; Refill: 0  2. Rash: Appears to be allergic in nature, will treat with hydrocortisone cream.  Spider bite healing with no signs of current infection.  - hydrocortisone 1 % ointment; Apply 1 Application topically 2 (  two) times daily.  Dispense: 30 g; Refill: 0  3. Primary hypertension: Much better controlled.  Continue benazepril-HCTZ 20-12.5 mg daily.  4. Weakness: Etiology still uncertain.  He was seen by neurology at [facility], who did a EMG which was nonspecific.  He does have an appointment with a neuromuscular specialist through [facility]. Is still drinking alcohol regularly, on multivitamin and B complex vitamin.   5. Need for influenza vaccination: Flu vaccine administered today.   - Flu Vaccine QUAD 34moIM (Fluarix,  Fluzone & Alfiuria Quad PF)   Follow-up: Return in about 6 weeks (around 10/08/2022).    ETeodora Medici DO

## 2022-08-27 NOTE — Patient Instructions (Signed)
It was great seeing you today!  Plan discussed at today's visit: -Lexapro increased to 10 mg daily, continue Hydroxyzine as needed for both anxiety and itching -Steroid cream sent to pharmacy for rash - can also use over the counter Benadryl cream for itching as well  -No other changes to medications made  Follow up in: 6 weeks   Take care and let us know if you have any questions or concerns prior to your next visit.  Dr. Rosana Berger

## 2022-09-09 ENCOUNTER — Encounter: Payer: Self-pay | Admitting: Internal Medicine

## 2022-09-23 ENCOUNTER — Emergency Department: Payer: BC Managed Care – PPO

## 2022-09-23 ENCOUNTER — Inpatient Hospital Stay
Admission: EM | Admit: 2022-09-23 | Discharge: 2022-09-25 | DRG: 101 | Disposition: A | Discharge status: Home / Self Care | Payer: BC Managed Care – PPO | Attending: Internal Medicine | Admitting: Internal Medicine

## 2022-09-23 DIAGNOSIS — D649 Anemia, unspecified: Secondary | ICD-10-CM | POA: Diagnosis present

## 2022-09-23 DIAGNOSIS — F1010 Alcohol abuse, uncomplicated: Secondary | ICD-10-CM | POA: Diagnosis present

## 2022-09-23 DIAGNOSIS — I10 Essential (primary) hypertension: Secondary | ICD-10-CM | POA: Insufficient documentation

## 2022-09-23 DIAGNOSIS — E871 Hypo-osmolality and hyponatremia: Secondary | ICD-10-CM | POA: Diagnosis present

## 2022-09-23 DIAGNOSIS — F32A Depression, unspecified: Secondary | ICD-10-CM | POA: Diagnosis present

## 2022-09-23 DIAGNOSIS — N179 Acute kidney failure, unspecified: Secondary | ICD-10-CM | POA: Diagnosis present

## 2022-09-23 DIAGNOSIS — Z8249 Family history of ischemic heart disease and other diseases of the circulatory system: Secondary | ICD-10-CM

## 2022-09-23 DIAGNOSIS — R569 Unspecified convulsions: Secondary | ICD-10-CM | POA: Diagnosis not present

## 2022-09-23 DIAGNOSIS — F17210 Nicotine dependence, cigarettes, uncomplicated: Secondary | ICD-10-CM | POA: Diagnosis present

## 2022-09-23 DIAGNOSIS — F419 Anxiety disorder, unspecified: Secondary | ICD-10-CM | POA: Diagnosis present

## 2022-09-23 DIAGNOSIS — Z79899 Other long term (current) drug therapy: Secondary | ICD-10-CM

## 2022-09-23 DIAGNOSIS — E876 Hypokalemia: Secondary | ICD-10-CM

## 2022-09-23 HISTORY — DX: Anxiety disorder, unspecified: F41.9

## 2022-09-23 LAB — CBC WITH DIFFERENTIAL/PLATELET
Abs Immature Granulocytes: 0.02 10*3/uL (ref 0.00–0.07)
Basophils Absolute: 0.1 10*3/uL (ref 0.0–0.1)
Basophils Relative: 1 %
Eosinophils Absolute: 0.1 10*3/uL (ref 0.0–0.5)
Eosinophils Relative: 1 %
HCT: 34 % — ABNORMAL LOW (ref 39.0–52.0)
Hemoglobin: 12.1 g/dL — ABNORMAL LOW (ref 13.0–17.0)
Immature Granulocytes: 0 %
Lymphocytes Relative: 21 %
Lymphs Abs: 1.4 10*3/uL (ref 0.7–4.0)
MCH: 34.9 pg — ABNORMAL HIGH (ref 26.0–34.0)
MCHC: 35.6 g/dL (ref 30.0–36.0)
MCV: 98 fL (ref 80.0–100.0)
Monocytes Absolute: 0.4 10*3/uL (ref 0.1–1.0)
Monocytes Relative: 6 %
Neutro Abs: 4.6 10*3/uL (ref 1.7–7.7)
Neutrophils Relative %: 71 %
Platelets: 171 10*3/uL (ref 150–400)
RBC: 3.47 MIL/uL — ABNORMAL LOW (ref 4.22–5.81)
RDW: 12.1 % (ref 11.5–15.5)
WBC: 6.5 10*3/uL (ref 4.0–10.5)
nRBC: 0 % (ref 0.0–0.2)

## 2022-09-23 LAB — ETHANOL: Alcohol, Ethyl (B): <10 mg/dL (ref <10)

## 2022-09-23 NOTE — ED Provider Notes (Incomplete)
[facility] Provider Note    Event Date/Time   First MD Initiated Contact with Patient 09/23/22 2313     (approximate)   History   Seizures   HPI  Melvin Knight is a 41 y.o. male  who presents to the emergency department today because of concern for seizure like episode. The patient denies history of seizures until today. Had one earlier in the day. Was then staying at a relatives house. He states that he has been seeing a neurologist recently for tremors that started earlier in the year. The patient states that part of the recommendation was to cut back on his alcohol use. Had been drinking roughly 5 cocktails a day. Had his last drink either 3 or 2 nights ago, he cannot exactly remember. Denies any headaches or pain at the time of my exam.   Physical Exam   Triage Vital Signs: ED Triage Vitals  Enc Vitals Group     BP 09/23/22 2309 104/70     Pulse Rate 09/23/22 2308 (!) 115     Resp 09/23/22 2308 12     Temp 09/23/22 2308 98.6 F (37 C)     Temp Source 09/23/22 2308 Oral     SpO2 09/23/22 2308 97 %     Weight 09/23/22 2307 195 lb (88.5 kg)     Height 09/23/22 2307 5\' 7"  (1.702 m)     Head Circumference --      Peak Flow --      Pain Score 09/23/22 2307 0     Pain Loc --      Pain Edu? --      Excl. in [place]? --     Most recent vital signs: Vitals:   09/23/22 2308 09/23/22 2309  BP:  104/70  Pulse: (!) 115   Resp: 12   Temp: 98.6 F (37 C)   SpO2: 97%     General: Awake, alert, oriented. CV:  Good peripheral perfusion. Tachycardia. Resp:  Normal effort. Lungs clear. Abd:  No distention.    ED Results / Procedures / Treatments   Labs (all labs ordered are listed, but only abnormal results are displayed) Labs Reviewed  CBC WITH DIFFERENTIAL/PLATELET - Abnormal; Notable for the following components:      Result Value   RBC 3.47 (*)    Hemoglobin 12.1 (*)    HCT 34.0 (*)    MCH 34.9 (*)    All other components within normal limits   COMPREHENSIVE METABOLIC PANEL  MAGNESIUM  ETHANOL  URINALYSIS, ROUTINE W REFLEX MICROSCOPIC  URINE DRUG SCREEN, QUALITATIVE ([facility] ONLY)     EKG  I, Nance Pear, attending physician, personally viewed and interpreted this EKG  EKG Time: 2309 Rate: 115 Rhythm: sinus tachycardia Axis: normal Intervals: qtc 453 QRS: narrow, q waves II, III, aVF ST changes: no st elevation Impression: abnormal ekg  RADIOLOGY I independently interpreted and visualized the CT head. My interpretation: No bleed. Radiology interpretation:  IMPRESSION:  No acute intracranial abnormality      PROCEDURES:  Critical Care performed: No  Procedures   MEDICATIONS ORDERED IN ED: Medications - No data to display   IMPRESSION / MDM / [place] / ED COURSE  I reviewed the triage vital signs and the nursing notes.                              Differential diagnosis includes, but is not  limited to, electrolyte abnormality, intracranial process, alcohol withdrawal.   Patient's presentation is most consistent with acute presentation with potential threat to life or bodily function.  The patient is on the cardiac monitor to evaluate for evidence of arrhythmia and/or significant heart rate changes.  Patient presented to the emergency department today because of concern for seizure like activity. He has now had two episodes today. Has history of significant alcohol use and stopped either 2 or 3 days ago. Blood work ***     FINAL CLINICAL IMPRESSION(S) / ED DIAGNOSES   Final diagnoses:  None     Rx / DC Orders   ED Discharge Orders     None        Note:  This document was prepared using Dragon voice recognition software and may include unintentional dictation errors.

## 2022-09-23 NOTE — ED Triage Notes (Signed)
To room 12 via [facility] from home with c/o seizure activity. Pt normally consumes "5 Cocktails per day", stopped drinking on Tuesday. Had reported seizure a few days ago and another one apx 45 mins ago. Per pt family, seizure lasted apx 30 secs.  Upon arrival to room, pt alert and oriented.

## 2022-09-23 NOTE — ED Provider Notes (Signed)
[facility] Provider Note    Event Date/Time   First MD Initiated Contact with Patient 09/23/22 2313     (approximate)   History   Seizures   HPI  Jaimeson Vitiello is a 41 y.o. male  who presents to the emergency department today because of concern for seizure like episode. The patient denies history of seizures until today. Had one earlier in the day. Was then staying at a relatives house. He states that he has been seeing a neurologist recently for tremors that started earlier in the year. The patient states that part of the recommendation was to cut back on his alcohol use. Had been drinking roughly 5 cocktails a day. Had his last drink either 3 or 2 nights ago, he cannot exactly remember. Denies any headaches or pain at the time of my exam.   Physical Exam   Triage Vital Signs: ED Triage Vitals  Enc Vitals Group     BP 09/23/22 2309 104/70     Pulse Rate 09/23/22 2308 (!) 115     Resp 09/23/22 2308 12     Temp 09/23/22 2308 98.6 F (37 C)     Temp Source 09/23/22 2308 Oral     SpO2 09/23/22 2308 97 %     Weight 09/23/22 2307 195 lb (88.5 kg)     Height 09/23/22 2307 5\' 7"  (1.702 m)     Head Circumference --      Peak Flow --      Pain Score 09/23/22 2307 0     Pain Loc --      Pain Edu? --      Excl. in [place]? --     Most recent vital signs: Vitals:   09/23/22 2308 09/23/22 2309  BP:  104/70  Pulse: (!) 115   Resp: 12   Temp: 98.6 F (37 C)   SpO2: 97%     General: Awake, alert, oriented. CV:  Good peripheral perfusion. Tachycardia. Resp:  Normal effort. Lungs clear. Abd:  No distention.    ED Results / Procedures / Treatments   Labs (all labs ordered are listed, but only abnormal results are displayed) Labs Reviewed  CBC WITH DIFFERENTIAL/PLATELET - Abnormal; Notable for the following components:      Result Value   RBC 3.47 (*)    Hemoglobin 12.1 (*)    HCT 34.0 (*)    MCH 34.9 (*)    All other components within normal limits   COMPREHENSIVE METABOLIC PANEL  MAGNESIUM  ETHANOL  URINALYSIS, ROUTINE W REFLEX MICROSCOPIC  URINE DRUG SCREEN, QUALITATIVE ([facility] ONLY)     EKG  I, Nance Pear, attending physician, personally viewed and interpreted this EKG  EKG Time: 2309 Rate: 115 Rhythm: sinus tachycardia Axis: normal Intervals: qtc 453 QRS: narrow, q waves II, III, aVF ST changes: no st elevation Impression: abnormal ekg  RADIOLOGY I independently interpreted and visualized the CT head. My interpretation: No bleed. Radiology interpretation:  IMPRESSION:  No acute intracranial abnormality      PROCEDURES:  Critical Care performed: No  Procedures   MEDICATIONS ORDERED IN ED: Medications - No data to display   IMPRESSION / MDM / [place] / ED COURSE  I reviewed the triage vital signs and the nursing notes.                              Differential diagnosis includes, but is not  limited to, electrolyte abnormality, intracranial process, alcohol withdrawal.   Patient's presentation is most consistent with acute presentation with potential threat to life or bodily function.  The patient is on the cardiac monitor to evaluate for evidence of arrhythmia and/or significant heart rate changes.  Patient presented to the emergency department today because of concern for seizure like activity. He has now had two episodes today. Has history of significant alcohol use and stopped either 2 or 3 days ago. Blood work with very mild hypokalemia and hypomagnesemia. Sodium wnl. Do think it is possible patient suffered from alcohol withdrawal seizures. Discussed with Dr. Sidney Ace with the hospitalist service who will plan on admission.     FINAL CLINICAL IMPRESSION(S) / ED DIAGNOSES   Final diagnoses:  Seizure [facility])      Note:  This document was prepared using Dragon voice recognition software and may include unintentional dictation errors.    Nance Pear, MD 09/24/22 [phone]

## 2022-09-24 ENCOUNTER — Inpatient Hospital Stay: Payer: BC Managed Care – PPO

## 2022-09-24 ENCOUNTER — Other Ambulatory Visit: Payer: Self-pay

## 2022-09-24 ENCOUNTER — Encounter: Payer: Self-pay | Admitting: Family Medicine

## 2022-09-24 DIAGNOSIS — E876 Hypokalemia: Secondary | ICD-10-CM

## 2022-09-24 DIAGNOSIS — D649 Anemia, unspecified: Secondary | ICD-10-CM | POA: Diagnosis present

## 2022-09-24 DIAGNOSIS — F1010 Alcohol abuse, uncomplicated: Secondary | ICD-10-CM

## 2022-09-24 DIAGNOSIS — E871 Hypo-osmolality and hyponatremia: Secondary | ICD-10-CM | POA: Diagnosis present

## 2022-09-24 DIAGNOSIS — F419 Anxiety disorder, unspecified: Secondary | ICD-10-CM | POA: Diagnosis present

## 2022-09-24 DIAGNOSIS — N179 Acute kidney failure, unspecified: Secondary | ICD-10-CM

## 2022-09-24 DIAGNOSIS — Z79899 Other long term (current) drug therapy: Secondary | ICD-10-CM | POA: Diagnosis not present

## 2022-09-24 DIAGNOSIS — I10 Essential (primary) hypertension: Secondary | ICD-10-CM | POA: Insufficient documentation

## 2022-09-24 DIAGNOSIS — F32A Depression, unspecified: Secondary | ICD-10-CM | POA: Diagnosis present

## 2022-09-24 DIAGNOSIS — Z8249 Family history of ischemic heart disease and other diseases of the circulatory system: Secondary | ICD-10-CM | POA: Diagnosis not present

## 2022-09-24 DIAGNOSIS — R569 Unspecified convulsions: Secondary | ICD-10-CM

## 2022-09-24 DIAGNOSIS — F17210 Nicotine dependence, cigarettes, uncomplicated: Secondary | ICD-10-CM | POA: Diagnosis present

## 2022-09-24 HISTORY — DX: Hypomagnesemia: E83.42

## 2022-09-24 HISTORY — DX: Hypokalemia: E87.6

## 2022-09-24 HISTORY — DX: Acute kidney failure, unspecified: N17.9

## 2022-09-24 LAB — URINE DRUG SCREEN, QUALITATIVE (ARMC ONLY)
Amphetamines, Ur Screen: NOT DETECTED
Barbiturates, Ur Screen: NOT DETECTED
Benzodiazepine, Ur Scrn: NOT DETECTED
Cannabinoid 50 Ng, Ur ~~LOC~~: NOT DETECTED
Cocaine Metabolite,Ur ~~LOC~~: NOT DETECTED
MDMA (Ecstasy)Ur Screen: NOT DETECTED
Methadone Scn, Ur: NOT DETECTED
Opiate, Ur Screen: NOT DETECTED
Phencyclidine (PCP) Ur S: NOT DETECTED
Tricyclic, Ur Screen: NOT DETECTED

## 2022-09-24 LAB — URINALYSIS, ROUTINE W REFLEX MICROSCOPIC
Bacteria, UA: NONE SEEN
Bilirubin Urine: NEGATIVE
Glucose, UA: NEGATIVE mg/dL
Hgb urine dipstick: NEGATIVE
Ketones, ur: 5 mg/dL — AB
Leukocytes,Ua: NEGATIVE
Nitrite: NEGATIVE
Protein, ur: 100 mg/dL — AB
Specific Gravity, Urine: 1.025 (ref 1.005–1.030)
pH: 5 (ref 5.0–8.0)

## 2022-09-24 LAB — BASIC METABOLIC PANEL
Anion gap: 12 (ref 5–15)
BUN: 24 mg/dL — ABNORMAL HIGH (ref 6–20)
CO2: 21 mmol/L — ABNORMAL LOW (ref 22–32)
Calcium: 9.1 mg/dL (ref 8.9–10.3)
Chloride: 100 mmol/L (ref 98–111)
Creatinine, Ser: 1.29 mg/dL — ABNORMAL HIGH (ref 0.61–1.24)
GFR, Estimated: >60 mL/min (ref >60)
Glucose, Bld: 110 mg/dL — ABNORMAL HIGH (ref 70–99)
Potassium: 3.3 mmol/L — ABNORMAL LOW (ref 3.5–5.1)
Sodium: 133 mmol/L — ABNORMAL LOW (ref 135–145)

## 2022-09-24 LAB — COMPREHENSIVE METABOLIC PANEL
ALT: 75 U/L — ABNORMAL HIGH (ref 0–44)
AST: 105 U/L — ABNORMAL HIGH (ref 15–41)
Albumin: 4.3 g/dL (ref 3.5–5.0)
Alkaline Phosphatase: 94 U/L (ref 38–126)
Anion gap: 20 — ABNORMAL HIGH (ref 5–15)
BUN: 26 mg/dL — ABNORMAL HIGH (ref 6–20)
CO2: 18 mmol/L — ABNORMAL LOW (ref 22–32)
Calcium: 10.1 mg/dL (ref 8.9–10.3)
Chloride: 98 mmol/L (ref 98–111)
Creatinine, Ser: 1.67 mg/dL — ABNORMAL HIGH (ref 0.61–1.24)
GFR, Estimated: 53 mL/min — ABNORMAL LOW (ref >60)
Glucose, Bld: 207 mg/dL — ABNORMAL HIGH (ref 70–99)
Potassium: 3.3 mmol/L — ABNORMAL LOW (ref 3.5–5.1)
Sodium: 136 mmol/L (ref 135–145)
Total Bilirubin: 1.3 mg/dL — ABNORMAL HIGH (ref 0.3–1.2)
Total Protein: 7.9 g/dL (ref 6.5–8.1)

## 2022-09-24 LAB — CBC
HCT: 30.8 % — ABNORMAL LOW (ref 39.0–52.0)
Hemoglobin: 11.1 g/dL — ABNORMAL LOW (ref 13.0–17.0)
MCH: 35.1 pg — ABNORMAL HIGH (ref 26.0–34.0)
MCHC: 36 g/dL (ref 30.0–36.0)
MCV: 97.5 fL (ref 80.0–100.0)
Platelets: 148 10*3/uL — ABNORMAL LOW (ref 150–400)
RBC: 3.16 MIL/uL — ABNORMAL LOW (ref 4.22–5.81)
RDW: 12.1 % (ref 11.5–15.5)
WBC: 5.8 10*3/uL (ref 4.0–10.5)
nRBC: 0 % (ref 0.0–0.2)

## 2022-09-24 LAB — MAGNESIUM
Magnesium: 1.6 mg/dL — ABNORMAL LOW (ref 1.7–2.4)
Magnesium: 2.3 mg/dL (ref 1.7–2.4)

## 2022-09-24 LAB — HIV ANTIBODY (ROUTINE TESTING W REFLEX): HIV Screen 4th Generation wRfx: NONREACTIVE

## 2022-09-24 MED ORDER — ENOXAPARIN SODIUM 60 MG/0.6ML IJ SOSY
0.5000 mg/kg | PREFILLED_SYRINGE | INTRAMUSCULAR | Status: DC
  Administered 2022-09-24 – 2022-09-25 (×2): 45 mg via SUBCUTANEOUS
  Filled 2022-09-24 (×2): qty 0.6

## 2022-09-24 MED ORDER — LORAZEPAM 2 MG/ML IJ SOLN: 1.0000 mg | INTRAMUSCULAR | Status: DC | PRN

## 2022-09-24 MED ORDER — POTASSIUM CHLORIDE CRYS ER 20 MEQ PO TBCR
20.0000 meq | EXTENDED_RELEASE_TABLET | Freq: Once | ORAL | Status: AC
  Administered 2022-09-24: 20 meq via ORAL
  Filled 2022-09-24: qty 1

## 2022-09-24 MED ORDER — HYDROXYZINE HCL 10 MG PO TABS: 10.0000 mg | ORAL_TABLET | ORAL | Status: DC | PRN

## 2022-09-24 MED ORDER — MAGNESIUM HYDROXIDE 400 MG/5ML PO SUSP: 30.0000 mL | Freq: Every day | ORAL | Status: DC | PRN

## 2022-09-24 MED ORDER — LORAZEPAM 1 MG PO TABS: 1.0000 mg | ORAL_TABLET | ORAL | Status: DC | PRN

## 2022-09-24 MED ORDER — THIAMINE MONONITRATE 100 MG PO TABS
100.0000 mg | ORAL_TABLET | Freq: Every day | ORAL | Status: DC
  Administered 2022-09-24 – 2022-09-25 (×2): 100 mg via ORAL
  Filled 2022-09-24 (×2): qty 1

## 2022-09-24 MED ORDER — MULTI-VITAMIN/MINERALS PO TABS: 1.0000 | ORAL_TABLET | Freq: Every day | ORAL | Status: DC

## 2022-09-24 MED ORDER — MAGNESIUM SULFATE 2 GM/50ML IV SOLN
2.0000 g | Freq: Once | INTRAVENOUS | Status: AC
  Administered 2022-09-24: 2 g via INTRAVENOUS
  Filled 2022-09-24: qty 50

## 2022-09-24 MED ORDER — SODIUM CHLORIDE 0.9 % IV SOLN: Freq: Once | INTRAVENOUS | Status: AC

## 2022-09-24 MED ORDER — ONDANSETRON HCL 4 MG PO TABS: 4.0000 mg | ORAL_TABLET | Freq: Four times a day (QID) | ORAL | Status: DC | PRN

## 2022-09-24 MED ORDER — HYDROCORTISONE 1 % EX OINT
1.0000 | TOPICAL_OINTMENT | Freq: Two times a day (BID) | CUTANEOUS | Status: DC
  Filled 2022-09-24: qty 28.35

## 2022-09-24 MED ORDER — GADOBUTROL 1 MMOL/ML IV SOLN
7.5000 mL | Freq: Once | INTRAVENOUS | Status: AC | PRN
  Administered 2022-09-24: 7.5 mL via INTRAVENOUS

## 2022-09-24 MED ORDER — ACETAMINOPHEN 325 MG PO TABS: 650.0000 mg | ORAL_TABLET | Freq: Four times a day (QID) | ORAL | Status: DC | PRN

## 2022-09-24 MED ORDER — ADULT MULTIVITAMIN W/MINERALS CH
1.0000 | ORAL_TABLET | Freq: Every day | ORAL | Status: DC
  Administered 2022-09-24 – 2022-09-25 (×2): 1 via ORAL
  Filled 2022-09-24 (×2): qty 1

## 2022-09-24 MED ORDER — POTASSIUM CHLORIDE IN NACL 20-0.9 MEQ/L-% IV SOLN
INTRAVENOUS | Status: DC
  Filled 2022-09-24 (×5): qty 1000

## 2022-09-24 MED ORDER — FOLIC ACID 1 MG PO TABS
1.0000 mg | ORAL_TABLET | Freq: Every day | ORAL | Status: DC
  Administered 2022-09-24 – 2022-09-25 (×2): 1 mg via ORAL
  Filled 2022-09-24 (×2): qty 1

## 2022-09-24 MED ORDER — ONDANSETRON HCL 4 MG/2ML IJ SOLN: 4.0000 mg | Freq: Four times a day (QID) | INTRAMUSCULAR | Status: DC | PRN

## 2022-09-24 MED ORDER — THIAMINE HCL 100 MG/ML IJ SOLN: 100.0000 mg | Freq: Every day | INTRAMUSCULAR | Status: DC

## 2022-09-24 MED ORDER — ESCITALOPRAM OXALATE 10 MG PO TABS
10.0000 mg | ORAL_TABLET | Freq: Every day | ORAL | Status: DC
  Administered 2022-09-24 – 2022-09-25 (×2): 10 mg via ORAL
  Filled 2022-09-24 (×2): qty 1

## 2022-09-24 MED ORDER — TRAZODONE HCL 50 MG PO TABS: 25.0000 mg | ORAL_TABLET | Freq: Every evening | ORAL | Status: DC | PRN

## 2022-09-24 MED ORDER — ACETAMINOPHEN 650 MG RE SUPP: 650.0000 mg | Freq: Four times a day (QID) | RECTAL | Status: DC | PRN

## 2022-09-24 NOTE — Progress Notes (Signed)
PHARMACIST - PHYSICIAN COMMUNICATION  CONCERNING:  Enoxaparin (Lovenox) for DVT Prophylaxis    RECOMMENDATION: Patient was prescribed enoxaprin 40mg  q24 hours for VTE prophylaxis.   Filed Weights   09/23/22 2307  Weight: 88.5 kg (195 lb)    Body mass index is 30.54 kg/m.  Estimated Creatinine Clearance: 63.7 mL/min (A) (by C-G formula based on SCr of 1.67 mg/dL (H)).   Based on [place] patient is candidate for enoxaparin 0.5mg /kg TBW SQ every 24 hours based on BMI being >30.  DESCRIPTION: Pharmacy has adjusted enoxaparin dose per [facility] policy.  Patient is now receiving enoxaparin 0.5 mg/kg every 24 hours   Renda Rolls, PharmD, [facility] 09/24/2022 1:49 AM

## 2022-09-24 NOTE — TOC Initial Note (Signed)
Transition of Care [facility]) - Initial/Assessment Note    Patient Details  Name: Melvin Knight MRN: [id number] Date of Birth: 10-02-1981  Transition of Care [facility]) CM/SW Contact:    Colen Darling, [place] Phone Number: 09/24/2022, 11:39 AM  Clinical Narrative:                  TOC met with the patient at bedside. His PCP is Dr. Wells Guiles at [facility]. He drives himself to appointments. [facility] [place]. He picks up his medications at he pharmacy. No prior home health or DME. He plans to return to his uncle's house at discharge.  Expected Discharge Plan: Home/Self Care     Patient Goals and CMS Choice    Home    Expected Discharge Plan and Services Expected Discharge Plan: Home/Self Care       Living arrangements for the past 2 months: Single Family Home                                      Prior Living Arrangements/Services Living arrangements for the past 2 months: Single Family Home   Patient language and need for interpreter reviewed:: No Do you feel safe going back to the place where you live?: Yes        Care giver support system in place?: Yes (comment)      Activities of Daily Living Home Assistive Devices/Equipment: None ADL Screening (condition at time of admission) Patient's cognitive ability adequate to safely complete daily activities?: Yes Is the patient deaf or have difficulty hearing?: No Does the patient have difficulty seeing, even when wearing glasses/contacts?: No Does the patient have difficulty concentrating, remembering, or making decisions?: No Patient able to express need for assistance with ADLs?: Yes Does the patient have difficulty dressing or bathing?: No Independently performs ADLs?: Yes (appropriate for developmental age) Does the patient have difficulty walking or climbing stairs?: Yes Weakness of Legs: Both Weakness of Arms/Hands: Both  Permission Sought/Granted Permission sought to  share information with : PCP    Share Information with NAME: PCP Dr. Wells Guiles- [facility]     Permission granted to share info w Relationship: Ozella Rocks- uncle     Emotional Assessment Appearance:: Appears stated age Attitude/Demeanor/Rapport: Engaged, Gracious Affect (typically observed): Adaptable, Accepting Orientation: : Oriented to Self, Oriented to Place, Oriented to  Time, Oriented to Situation      Admission diagnosis:  Seizure ([place]) [R56.9] Seizures ([place]) [R56.9] Patient Active Problem List   Diagnosis Date Noted   Seizures ([place]) 09/24/2022   Hypokalemia 09/24/2022   Hypomagnesemia 09/24/2022   Essential hypertension 09/24/2022   Anxiety and depression 09/24/2022   Alcohol abuse 09/24/2022   Weakness 02/08/2022   Stress 01/22/2022   Alcohol use disorder, moderate, dependence ([place]) 01/11/2022   HTN (hypertension) 12/15/2021   Elevated LFTs 12/15/2021   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 [place]Phone: [phone]: [phone]    Social Determinants of Health (SDOH) Interventions    Readmission Risk Interventions     No data to display

## 2022-09-24 NOTE — Plan of Care (Signed)

## 2022-09-24 NOTE — Progress Notes (Signed)
Same day as admission, brief rounding note.  Pt admitted after midnight after having a witnessed seizure, attributed to suspected alcohol withdrawal.   He also presented with hypokalemia, hypomagnesemia with K and Mg replacement ordered on admission.   Interval Hx: Patient seen and examined at bedside, awake sitting up in bed.  Patient reports neurologist was in to see him earlier and has ordered MRI.  He reports overall feeling well.  No acute complaints at this time.  He reports being seen by a neurologist for worsening bilateral upper extremity tremors that seem unrelated to any alcohol consumption.  Exam: General exam: awake, alert, no acute distress HEENT: atraumatic, clear conjunctiva, anicteric sclera, moist mucus membranes, hearing grossly normal  Respiratory system: on room air, normal respiratory effort. Cardiovascular system: normal S1/S2, RRR, no pedal edema.   Gastrointestinal system: soft, NT, ND, no HSM felt, +bowel sounds. Central nervous system: A&O x4. no gross focal neurologic deficits, normal speech Extremities: mild tremor of outstretched hands, no edema, normal tone Skin: dry, intact, normal temperature Psychiatry: normal mood, congruent affect, judgement and insight appear normal   Assessment & Plan:   Per H&P by Dr. Sidney Ace, and per Neurology. 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  -- EEG is done but report pending -- Monitoring by CIWA protocol    No charge

## 2022-09-24 NOTE — Assessment & Plan Note (Signed)
Monitored on CIWA protocol, no significant withdrawal symptoms developed during admission.

## 2022-09-24 NOTE — Assessment & Plan Note (Signed)
Mg was replaced. Repeat Mg level at follow up.

## 2022-09-24 NOTE — Assessment & Plan Note (Signed)
Continue hydroxyzine and Lexapro

## 2022-09-24 NOTE — Procedures (Addendum)
History: 41 year old male with likely alcohol withdrawal seizure  Sedation: None  Technique: This EEG was acquired with electrodes placed according to the International 10-20 electrode system (including Fp1, Fp2, F3, F4, C3, C4, P3, P4, O1, O2, T3, T4, T5, T6, A1, A2, Fz, Cz, Pz). The following electrodes were missing or displaced: none.   Background: The background consists of intermixed alpha and beta activities. There is a well defined posterior dominant rhythm of nine hz that attenuates with eye opening.  With drowsiness there is an increase in slow activity, but sleep is not recorded.  Photic stimulation: Physiologic driving is present  EEG Abnormalities: None  Clinical Interpretation: This normal EEG is recorded in the waking and drowsy state. There was no seizure or seizure predisposition recorded on this study. Please note that lack of epileptiform activity on EEG does not preclude the possibility of epilepsy.   Roland Rack, MD [facility] [phone]  If 7pm- 7am, please page neurology on call as listed in [place].

## 2022-09-24 NOTE — Progress Notes (Signed)
Eeg done 

## 2022-09-24 NOTE — Consult Note (Signed)
Neurology Consultation Reason for Consult: Seizures Referring Physician: Rito Ehrlich  CC: Seizures  History is obtained from: Patient  HPI: Melvin Knight is a 41 y.o. male with a history of hypertension, anxiety who presents with new onset seizures.  He recently saw a neurologist for tremor.  He has been cutting back on alcohol.  He states that he typically would drink "5-6 cocktails per day" and when asked how long a fifth(750 mL) would last, the answer is 2 days.  He estimates he drinks approximately 15/month.  His last drink was on Monday.  He has been staying with his uncle because he has been concerned about possible side effects with stopping alcohol use.  He has gone for multiple days in the past without severe withdrawal or seizures.  Last night, however, shortly after entering his uncles house, he had an unusual visual phenomenon where he thought he was seeing something, but could not quite make out what it was.  He felt like it was something that was not there, then subsequently lost consciousness.  He reportedly had seizure without event.  He subsequently had a repeated seizure soon thereafter and therefore sought care in the emergency department.   Past Medical History:  Diagnosis Date   Anxiety    Hypertension    Hypokalemia      Family History  Problem Relation Age of Onset   Hypertension Father    Stroke Father    Cancer Sister      Social History:  reports that he has been smoking cigarettes. He has been smoking an average of 1 pack per day. He has never used smokeless tobacco. He reports current alcohol use of about 6.0 standard drinks of alcohol per week. He reports that he does not currently use drugs.   Exam: Current vital signs: BP 113/74 (BP Location: Right Arm)   Pulse 82   Temp 98.7 F (37.1 C)   Resp 18   Ht 5\' 7"  (1.702 m)   Wt 88.5 kg   SpO2 99%   BMI 30.54 kg/m  Vital signs in last 24 hours: Temp:  [98.3 F (36.8 C)-98.7 F (37.1 C)] 98.7 F  (37.1 C) (11/03 0509) Pulse Rate:  [79-115] 82 (11/03 0509) Resp:  [12-20] 18 (11/03 0509) BP: (102-134)/(70-96) 113/74 (11/03 0509) SpO2:  [97 %-99 %] 99 % (11/03 0509) Weight:  [88.5 kg] 88.5 kg (11/02 2307)   Physical Exam  Constitutional: Appears well-developed and well-nourished.  Psych: Affect appropriate to situation Eyes: No scleral injection HENT: No OP obstruction MSK: no joint deformities.  Cardiovascular: Normal rate and regular rhythm.  Respiratory: Effort normal, non-labored breathing GI: Soft.  No distension. There is no tenderness.  Skin: WDI  Neuro: Mental Status: Patient is awake, alert, oriented to person, place, month, year, and situation. Patient is able to give a clear and coherent history. No signs of aphasia or neglect Cranial Nerves: II: Visual Fields are full. Pupils are equal, round, and reactive to light.   III,IV, VI: EOMI without ptosis or diploplia.  V: Facial sensation is symmetric to temperature VII: Facial movement is symmetric.  VIII: hearing is intact to voice X: Uvula elevates symmetrically XI: Shoulder shrug is symmetric. XII: tongue is midline without atrophy or fasciculations.  Motor: Tone is normal. Bulk is normal. 5/5 strength was present in all four extremities.  He has a postural tremor with a mild intentional component bilaterally. Sensory: Sensation is symmetric to light touch and temperature in the arms and legs. Cerebellar:  FNF and HKS are intact bilaterally      I have reviewed labs in epic and the results pertinent to this consultation are: Magnesium 1.6 on admission Creatinine 1.67  I have reviewed the images obtained: CT head-negative  Impression: 41 year old male with what I suspect to be alcohol withdrawal seizures.  He is not in florid withdrawal at this time, and given that he has not had any further seizures even in the absence of benzodiazepine, I would favor holding off on scheduled benzo for now getting  through the seizure window.  The transient visual symptoms prior to his seizure are unusual, and therefore I do think further evaluation with MRI and EEG would be prudent.  Unless there was some compelling reason to start antiepileptics, I would not favor doing so.  Recommendations: 1) CIWA protocol 2) MRI brain with and without contrast 3) EEG 4) neurology will follow  Roland Rack, MD [facility] [phone]  If 7pm- 7am, please page neurology on call as listed in [place].

## 2022-09-24 NOTE — Assessment & Plan Note (Signed)
Suspect due to alcohol withdrawal. Neurology was consulted. EEG normal and MRI brain negative for findings to cause seizure. --No driving until cleared by neurology --Outpatient neurology follow up --Pt counseled regarding risk of abruptly stopping alcohol

## 2022-09-24 NOTE — Assessment & Plan Note (Signed)
Continue benazepril-HCTZ

## 2022-09-24 NOTE — Assessment & Plan Note (Signed)
Cr on admission 1.67, up from prior ~1.2. Likely pre-renal azotemia. Improved to Cr 1.29. Monitor BMP

## 2022-09-24 NOTE — Assessment & Plan Note (Signed)
K was replaced.   Repeat BMP at follow up.

## 2022-09-24 NOTE — H&P (Addendum)
[place]   PATIENT NAME: Melvin Knight    MR#:  [id number]  DATE OF BIRTH:  09/11/1981  DATE OF ADMISSION:  09/23/2022  PRIMARY CARE PHYSICIAN: [name], DO   Patient is coming from: Home  REQUESTING/REFERRING PHYSICIAN: Nance Pear, MD  CHIEF COMPLAINT:   Chief Complaint  Patient presents with   Seizures    HISTORY OF PRESENT ILLNESS:  Melvin Knight is a 41 y.o. Caucasian male with medical history significant for hypertension, hypokalemia and tobacco and alcohol abuse, presented to the emergency room with acute onset of 2 episodes of seizures which were witnessed by his uncle who is a retired Engineer, drilling and home the patient stays with.  He denied any tongue bites, urinary or stool incontinence.  He drinks 5 mixed drinks on a regular basis and his last drink was on Monday.  He denies any chest pain or dyspnea or palpitations.  No cough or wheezing.  No nausea or vomiting or abdominal pain.  No bleeding diathesis.  No dysuria, oliguria or hematuria or flank pain.   ED Course: When he came to the ER, heart rate was 115 and otherwise vital signs were within normal.  Labs revealed hypokalemia of 3.3 and hypomagnesemia of 1.67 with ALT of 75 and AST of 105 with total bili 1.3 and anion gap of 20 with CO2 of 18 and blood glucose of 207.  CBC showed mild anemia and UA showed 100 protein with 5 ketones and 6-10 WBCs with no bacteria.  Urine drug screen was negative. EKG as reviewed by me : EKG showed sinus tachycardia with a rate of 115 with Q waves inferiorly. Imaging: Noncontrasted scan revealed no acute intracranial normalities.  The patient was given hydration with IV normal saline and potassium replacement.  He will be admitted to a medical telemetry bed for further evaluation and management. PAST MEDICAL HISTORY:   Past Medical History:  Diagnosis Date   Anxiety    Hypertension    Hypokalemia     PAST SURGICAL HISTORY:  History reviewed. No pertinent  surgical history.  He denies any previous surgeries.  SOCIAL HISTORY:   Social History   Tobacco Use   Smoking status: Every Day    Packs/day: 1.00    Types: Cigarettes   Smokeless tobacco: Never  Substance Use Topics   Alcohol use: Yes    Alcohol/week: 6.0 standard drinks of alcohol    Types: 6 Shots of liquor per week    Comment: 6 cocktails per day    FAMILY HISTORY:   Family History  Problem Relation Age of Onset   Hypertension Father    Stroke Father    Cancer Sister     DRUG ALLERGIES:  No Known Allergies  REVIEW OF SYSTEMS:   ROS As per history of present illness. All pertinent systems were reviewed above. Constitutional, HEENT, cardiovascular, respiratory, GI, GU, musculoskeletal, neuro, psychiatric, endocrine, integumentary and hematologic systems were reviewed and are otherwise negative/unremarkable except for positive findings mentioned above in the HPI.   MEDICATIONS AT HOME:   Prior to Admission medications   Medication Sig Start Date End Date Taking? Authorizing Provider  benazepril-hydrochlorthiazide (LOTENSIN HCT) 20-12.5 MG tablet Take 1 tablet by mouth daily. 05/10/22  Yes Myles Gip, DO  escitalopram (LEXAPRO) 10 MG tablet Take 1 tablet (10 mg total) by mouth daily. 08/27/22  Yes [name], DO  hydrocortisone 1 % ointment Apply 1 Application topically 2 (two) times daily. 08/27/22  Yes Rosana Berger,  Gentry Fitz, DO  Multiple Vitamins-Minerals (MULTIVITAMIN WITH MINERALS) tablet Take 1 tablet by mouth daily. 07/06/22  Yes Margarita Mail, DO  hydrOXYzine (ATARAX) 10 MG tablet TAKE 1 TABLET (10 MG TOTAL) BY MOUTH AS NEEDED FOR ANXIETY. 08/16/22   Margarita Mail, DO      VITAL SIGNS:  Blood pressure 113/74, pulse 82, temperature 98.7 F (37.1 C), resp. rate 18, height 5\' 7"  (1.702 m), weight 88.5 kg, SpO2 99 %.  PHYSICAL EXAMINATION:  Physical Exam  GENERAL:  41 y.o.-year-old Caucasian male patient lying in the bed with no acute  distress.  EYES: Pupils equal, round, reactive to light and accommodation. No scleral icterus. Extraocular muscles intact.  HEENT: Head atraumatic, normocephalic. Oropharynx and nasopharynx clear.  NECK:  Supple, no jugular venous distention. No thyroid enlargement, no tenderness.  LUNGS: Normal breath sounds bilaterally, no wheezing, rales,rhonchi or crepitation. No use of accessory muscles of respiration.  CARDIOVASCULAR: Regular rate and rhythm, S1, S2 normal. No murmurs, rubs, or gallops.  ABDOMEN: Soft, nondistended, nontender. Bowel sounds present. No organomegaly or mass.  EXTREMITIES: No pedal edema, cyanosis, or clubbing.  NEUROLOGIC: Cranial nerves II through XII are intact. Muscle strength 5/5 in all extremities. Sensation intact. Gait not checked.  PSYCHIATRIC: The patient is alert and oriented x 3.  Normal affect and good eye contact. SKIN: No obvious rash, lesion, or ulcer.   LABORATORY PANEL:   CBC Recent Labs  Lab 09/24/22 0542  WBC 5.8  HGB 11.1*  HCT 30.8*  PLT 148*   ------------------------------------------------------------------------------------------------------------------  Chemistries  Recent Labs  Lab 09/23/22 2315 09/24/22 0542  NA 136 133*  K 3.3* 3.3*  CL 98 100  CO2 18* 21*  GLUCOSE 207* 110*  BUN 26* 24*  CREATININE 1.67* 1.29*  CALCIUM 10.1 9.1  MG 1.6*  --   AST 105*  --   ALT 75*  --   ALKPHOS 94  --   BILITOT 1.3*  --    ------------------------------------------------------------------------------------------------------------------  Cardiac Enzymes No results for input(s): "TROPONINI" in the last 168 hours. ------------------------------------------------------------------------------------------------------------------  RADIOLOGY:  CT Head Wo Contrast  Result Date: 09/23/2022 CLINICAL DATA:  Seizure-like activity EXAM: CT HEAD WITHOUT CONTRAST TECHNIQUE: Contiguous axial images were obtained from the base of the skull  through the vertex without intravenous contrast. RADIATION DOSE REDUCTION: This exam was performed according to the departmental dose-optimization program which includes automated exposure control, adjustment of the mA and/or kV according to patient size and/or use of iterative reconstruction technique. COMPARISON:  None Available. FINDINGS: Brain: No acute intracranial abnormality. Specifically, no hemorrhage, hydrocephalus, mass lesion, acute infarction, or significant intracranial injury. Vascular: No hyperdense vessel or unexpected calcification. Skull: No acute calvarial abnormality. Sinuses/Orbits: No acute findings Other: None IMPRESSION: No acute intracranial abnormality. Electronically Signed   By: 13/12/2021 M.D.   On: 09/23/2022 23:41      IMPRESSION AND PLAN:  Assessment and Plan: * Seizures (HCC) - This likely alcohol withdrawal seizures. - The patient will be admitted to a medical telemetry bed. - We will place him on seizure precautions. - We will place on as needed IV Ativan for alcohol withdrawal as well as seizures. - We will obtain EEG and neurology consult. - I notified Dr. 13/12/2021 about patient.  Hypokalemia - Potassium will be replaced.  Alcohol abuse - The patient will be placed on CIWA protocol for alcohol withdrawal.  Hypomagnesemia - Magnesium will be replaced.  Anxiety and depression - We will continue hydroxyzine and Lexapro  Essential hypertension -  We will hold off diuretic therapy given hyperkalemia and hyponatremia. - We will place him on as needed IV labetalol.       DVT prophylaxis: Lovenox.  Advanced Care Planning:  Code Status: full code.  Family Communication:  The plan of care was discussed in details with the patient (and family). I answered all questions. The patient agreed to proceed with the above mentioned plan. Further management will depend upon hospital course. Disposition Plan: Back to previous home environment Consults  called: Neurology All the records are reviewed and case discussed with ED provider.  Status is: Inpatient   At the time of the admission, it appears that the appropriate admission status for this patient is inpatient.  This is judged to be reasonable and necessary in order to provide the required intensity of service to ensure the patient's safety given the presenting symptoms, physical exam findings and initial radiographic and laboratory data in the context of comorbid conditions.  The patient requires inpatient status due to high intensity of service, high risk of further deterioration and high frequency of surveillance required.  I certify that at the time of admission, it is my clinical judgment that the patient will require inpatient hospital care extending more than 2 midnights.                            Dispo: The patient is from: Home              Anticipated d/c is to: Home              Patient currently is not medically stable to d/c.              Difficult to place patient: No  Hannah Beat M.D on 09/24/2022 at 7:48 AM  [facility]   From 7 PM-7 AM, contact night-coverage [email or web address]  CC: Primary care physician; Margarita Mail, DO

## 2022-09-25 ENCOUNTER — Encounter: Payer: Self-pay | Admitting: Family Medicine

## 2022-09-25 DIAGNOSIS — E876 Hypokalemia: Secondary | ICD-10-CM | POA: Diagnosis not present

## 2022-09-25 DIAGNOSIS — R569 Unspecified convulsions: Secondary | ICD-10-CM | POA: Diagnosis not present

## 2022-09-25 DIAGNOSIS — F1010 Alcohol abuse, uncomplicated: Secondary | ICD-10-CM | POA: Diagnosis not present

## 2022-09-25 LAB — BASIC METABOLIC PANEL WITH GFR
Anion gap: 8 (ref 5–15)
BUN: 25 mg/dL — ABNORMAL HIGH (ref 6–20)
CO2: 19 mmol/L — ABNORMAL LOW (ref 22–32)
Calcium: 9.1 mg/dL (ref 8.9–10.3)
Chloride: 111 mmol/L (ref 98–111)
Creatinine, Ser: 1.15 mg/dL (ref 0.61–1.24)
GFR, Estimated: >60 mL/min
Glucose, Bld: 92 mg/dL (ref 70–99)
Potassium: 4 mmol/L (ref 3.5–5.1)
Sodium: 138 mmol/L (ref 135–145)

## 2022-09-25 MED ORDER — VITAMIN B-1 100 MG PO TABS: 100.0000 mg | ORAL_TABLET | Freq: Every day | ORAL | 2 refills | Status: DC

## 2022-09-25 MED ORDER — FOLIC ACID 1 MG PO TABS: 1.0000 mg | ORAL_TABLET | Freq: Every day | ORAL | 2 refills | Status: DC

## 2022-09-25 NOTE — Discharge Summary (Signed)
Physician Discharge Summary   Patient: Melvin Knight MRN: [id number] DOB: Oct 23, 1981  Admit date:     09/23/2022  Discharge date: 09/25/22  Discharge Physician: Ezekiel Slocumb   PCP: [name], DO   Recommendations at discharge:   Follow up with Neurology Follow up with Primary Care in 1-2 weeks NO DRIVING until cleared by neurology Recommend discussing medical therapy to help with alcohol cessation, if needed  Discharge Diagnoses: Principal Problem:   Seizures ([place]) Active Problems:   Alcohol abuse   Essential hypertension   Anxiety and depression  Resolved Problems:   Hypokalemia   Hypomagnesemia   AKI (acute kidney injury) [facility])  Hospital Course: Patient was admitted after having witnessed seizure at home in the setting of recently stopping alcohol consumption.  No prior history of seizures.    Assessment and Plan: * Seizures ([place]) Suspect due to alcohol withdrawal. Neurology was consulted. EEG normal and MRI brain negative for findings to cause seizure. --No driving until cleared by neurology --Outpatient neurology follow up --Pt counseled regarding risk of abruptly stopping alcohol  Hypokalemia-resolved as of 09/25/2022 K was replaced.   Repeat BMP at follow up.  Alcohol abuse Monitored on CIWA protocol, no significant withdrawal symptoms developed during admission.  Hypomagnesemia-resolved as of 09/25/2022 Mg was replaced. Repeat Mg level at follow up.   Anxiety and depression Continue hydroxyzine and Lexapro  Essential hypertension Continue benazepril-HCTZ  AKI (acute kidney injury) ([facility]CC)-resolved as of 09/25/2022 Cr on admission 1.67, up from prior ~1.2. Likely pre-renal azotemia. Improved to Cr 1.29. Monitor BMP         Consultants: Neurology Procedures performed: EEG  Disposition: Home Diet recommendation:  Discharge Diet Orders (From admission, onward)     Start     Ordered   09/25/22 0000  Diet - low sodium heart healthy         09/25/22 0918           Regular diet DISCHARGE MEDICATION: Allergies as of 09/25/2022   No Known Allergies      Medication List     TAKE these medications    benazepril-hydrochlorthiazide 20-12.5 MG tablet Commonly known as: LOTENSIN HCT Take 1 tablet by mouth daily.   escitalopram 10 MG tablet Commonly known as: Lexapro Take 1 tablet (10 mg total) by mouth daily.   folic acid 1 MG tablet Commonly known as: FOLVITE Take 1 tablet (1 mg total) by mouth daily. Start taking on: September 26, 2022   hydrocortisone 1 % ointment Apply 1 Application topically 2 (two) times daily.   hydrOXYzine 10 MG tablet Commonly known as: ATARAX TAKE 1 TABLET (10 MG TOTAL) BY MOUTH AS NEEDED FOR ANXIETY.   multivitamin with minerals tablet Take 1 tablet by mouth daily.   thiamine 100 MG tablet Commonly known as: Vitamin B-1 Take 1 tablet (100 mg total) by mouth daily. Start taking on: September 26, 2022        Follow-up Information     [name], DO. Call.   Specialty: Internal Medicine Why: Follow up after hospital admission for alcohol withdrawal seizure. Contact information: [address] [place] [address] [phone]         Anabel Bene, MD Follow up.   Specialty: Neurology Why: Schedule follow up regarding seizure.  Pt instructed not to drive until cleared by neurology. Contact information: [facility] [place] [address] [phone]  Discharge Exam: Filed Weights   09/23/22 2307  Weight: 88.5 kg   General exam: awake, alert, no acute distress HEENT: atraumatic, clear conjunctiva, anicteric sclera, moist mucus membranes, hearing grossly normal  Respiratory system: on room air, normal respiratory effort. Cardiovascular system: RRR, no peripheral edema Central nervous system: A&O x4. no gross focal neurologic deficits, normal speech Extremities:  moves all, no edema, normal tone Skin: dry, intact, No rashes, lesions or ulcers Psychiatry: anxious mood, congruent affect, judgement and insight appear normal   Condition at discharge: stable  The results of significant diagnostics from this hospitalization (including imaging, microbiology, ancillary and laboratory) are listed below for reference.   Imaging Studies: EEG adult  Result Date: Oct 21, 2022 Greta Doom, MD     10/21/22  3:19 PM History: 41 year old male with likely alcohol withdrawal seizure Sedation: None Technique: This EEG was acquired with electrodes placed according to the International 10-20 electrode system (including Fp1, Fp2, F3, F4, C3, C4, P3, P4, O1, O2, T3, T4, T5, T6, A1, A2, Fz, Cz, Pz). The following electrodes were missing or displaced: none. Background: The background consists of intermixed alpha and beta activities. There is a well defined posterior dominant rhythm of nine hz that attenuates with eye opening.  With drowsiness there is an increase in slow activity, but sleep is not recorded. Photic stimulation: Physiologic driving is present EEG Abnormalities: None Clinical Interpretation: This normal EEG is recorded in the waking and drowsy state. There was no seizure or seizure predisposition recorded on this study. Please note that lack of epileptiform activity on EEG does not preclude the possibility of epilepsy. Roland Rack, MD [facility] [phone] If 7pm- 7am, please page neurology on call as listed in [place].   MR BRAIN W WO CONTRAST  Result Date: 10-21-2022 CLINICAL DATA:  Acute onset of 2 episodes of seizures. EXAM: MRI HEAD WITHOUT AND WITH CONTRAST TECHNIQUE: Multiplanar, multiecho pulse sequences of the brain and surrounding structures were obtained without and with intravenous contrast. CONTRAST:  7.54mL GADAVIST GADOBUTROL 1 MMOL/ML IV SOLN COMPARISON:  CT head 1 day prior, brain MRI 08/07/2019 FINDINGS: Brain: There is no  acute intracranial hemorrhage, extra-axial fluid collection, or acute infarct. Parenchymal volume is normal. The ventricles are normal in size. Gray-white differentiation is preserved. 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 Parenchymal signal is otherwise normal. The hippocampi are normal in signal and architecture. The corpus callosum is normally formed. There is a 1.1 cm x 0.5 cm x 0.5 cm T1 hyperintense, T2 hypointense lesion in the posterior aspect of the sella. There is no suprasellar extension. The pituitary infundibulum is midline. There is no solid mass lesion. There is no mass effect or midline shift. Vascular: Normal flow voids. Skull and upper cervical spine: Normal marrow signal. Sinuses/Orbits: The paranasal sinuses are clear. The globes and orbits are unremarkable. Other: None. IMPRESSION: 1. No acute intracranial pathology or epileptogenic focus identified. 2. 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 3. 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 No suprasellar extension or mass effect. Electronically Signed   By: Valetta Mole M.D.   On: 2022/10/21 13:33   CT Head Wo Contrast  Result Date: 09/23/2022 CLINICAL DATA:  Seizure-like activity EXAM: CT HEAD WITHOUT CONTRAST TECHNIQUE: Contiguous axial images were obtained from the base of the skull through the vertex without intravenous contrast. RADIATION DOSE REDUCTION: This exam was performed according to the  departmental dose-optimization program which includes automated exposure control, adjustment of the mA and/or kV according to patient size and/or use of iterative reconstruction technique. COMPARISON:  None Available. FINDINGS: Brain: No acute intracranial abnormality. Specifically, no hemorrhage, hydrocephalus, mass lesion, acute infarction, or significant intracranial injury. Vascular: No hyperdense vessel or unexpected  calcification. Skull: No acute calvarial abnormality. Sinuses/Orbits: No acute findings Other: None IMPRESSION: No acute intracranial abnormality. Electronically Signed   By: Rolm Baptise M.D.   On: 09/23/2022 23:41    Microbiology: Results for orders placed or performed during the hospital encounter of 08/07/19  SARS CORONAVIRUS 2 (TAT 6-24 HRS) Nasopharyngeal Nasopharyngeal Swab     Status: None   Collection Time: 08/08/19  1:00 AM   Specimen: Nasopharyngeal Swab  Result Value Ref Range Status   SARS Coronavirus 2 NEGATIVE NEGATIVE Final    Comment: (NOTE) SARS-CoV-2 target nucleic acids are NOT DETECTED. The SARS-CoV-2 RNA is generally detectable in upper and lower respiratory specimens during the acute phase of infection. Negative results do not preclude SARS-CoV-2 infection, do not rule out co-infections with other pathogens, and should not be used as the sole basis for treatment or other patient management decisions. Negative results must be combined with clinical observations, patient history, and epidemiological information. The expected result is Negative. Fact Sheet for Patients: SugarRoll.be Fact Sheet for Healthcare Providers: https://www.woods-mathews.com/ This test is not yet approved or cleared by the [place] FDA and  has been authorized for detection and/or diagnosis of SARS-CoV-2 by FDA under an Emergency Use Authorization (EUA). This EUA will remain  in effect (meaning this test can be used) for the duration of the COVID-19 declaration under Section 56 4(b)(1) of the Act, 21 U.S.C. section 360bbb-3(b)(1), unless the authorization is terminated or revoked sooner. Performed at [facility], [place] [address] [place], [place] [address]     Labs: CBC: Recent Labs  Lab 09/23/22 2315 09/24/22 0542  WBC 6.5 5.8  NEUTROABS 4.6  --   HGB 12.1* 11.1*  HCT 34.0* 30.8*  MCV 98.0 97.5  PLT 171 148*   Basic  Metabolic Panel: Recent Labs  Lab 09/23/22 2315 09/24/22 0542 09/25/22 0555  NA 136 133* 138  K 3.3* 3.3* 4.0  CL 98 100 111  CO2 18* 21* 19*  GLUCOSE 207* 110* 92  BUN 26* 24* 25*  CREATININE 1.67* 1.29* 1.15  CALCIUM 10.1 9.1 9.1  MG 1.6* 2.3  --    Liver Function Tests: Recent Labs  Lab 09/23/22 2315  AST 105*  ALT 75*  ALKPHOS 94  BILITOT 1.3*  PROT 7.9  ALBUMIN 4.3   CBG: No results for input(s): "GLUCAP" in the last 168 hours.  Discharge time spent: less than 30 minutes.  Signed: Ezekiel Slocumb, DO [facility] 09/25/2022

## 2022-09-25 NOTE — Plan of Care (Signed)

## 2022-09-25 NOTE — Progress Notes (Signed)
Pt discharged home. PIV and tele removed. Discharge instructions reviewed with patient, verbalizes understanding. Pt refused wheelchair and wanted to walk out. Pt instructed about no driving until followed up by neurology, verbalizes understanding.

## 2022-09-25 NOTE — Plan of Care (Signed)
  Problem: Education: Goal: Knowledge of General Education information will improve Description: Including pain rating scale, medication(s)/side effects and non-pharmacologic comfort measures 09/25/2022 0937 by Mancel Bale, RN Outcome: Adequate for Discharge 09/25/2022 0936 by Mancel Bale, RN Outcome: Progressing   Problem: Health Behavior/Discharge Planning: Goal: Ability to manage health-related needs will improve 09/25/2022 0937 by Mancel Bale, RN Outcome: Adequate for Discharge 09/25/2022 0936 by Mancel Bale, RN Outcome: Progressing   Problem: Clinical Measurements: Goal: Ability to maintain clinical measurements within normal limits will improve 09/25/2022 0937 by Mancel Bale, RN Outcome: Adequate for Discharge 09/25/2022 0936 by Mancel Bale, RN Outcome: Progressing Goal: Will remain free from infection 09/25/2022 0937 by Mancel Bale, RN Outcome: Adequate for Discharge 09/25/2022 0936 by Mancel Bale, RN Outcome: Progressing Goal: Diagnostic test results will improve 09/25/2022 0937 by Mancel Bale, RN Outcome: Adequate for Discharge 09/25/2022 0936 by Mancel Bale, RN Outcome: Progressing Goal: Respiratory complications will improve 09/25/2022 0937 by Mancel Bale, RN Outcome: Adequate for Discharge 09/25/2022 0936 by Mancel Bale, RN Outcome: Progressing Goal: Cardiovascular complication will be avoided 09/25/2022 0937 by Mancel Bale, RN Outcome: Adequate for Discharge 09/25/2022 0936 by Mancel Bale, RN Outcome: Progressing   Problem: Activity: Goal: Risk for activity intolerance will decrease 09/25/2022 0937 by Mancel Bale, RN Outcome: Adequate for Discharge 09/25/2022 0936 by Mancel Bale, RN Outcome: Progressing   Problem: Nutrition: Goal: Adequate nutrition will be maintained 09/25/2022 0937 by Mancel Bale, RN Outcome: Adequate for Discharge 09/25/2022 0936 by Mancel Bale, RN Outcome:  Progressing   Problem: Coping: Goal: Level of anxiety will decrease 09/25/2022 0937 by Mancel Bale, RN Outcome: Adequate for Discharge 09/25/2022 0936 by Mancel Bale, RN Outcome: Progressing   Problem: Elimination: Goal: Will not experience complications related to bowel motility 09/25/2022 0937 by Mancel Bale, RN Outcome: Adequate for Discharge 09/25/2022 0936 by Mancel Bale, RN Outcome: Progressing Goal: Will not experience complications related to urinary retention 09/25/2022 0937 by Mancel Bale, RN Outcome: Adequate for Discharge 09/25/2022 0936 by Mancel Bale, RN Outcome: Progressing   Problem: Pain Managment: Goal: General experience of comfort will improve 09/25/2022 0937 by Mancel Bale, RN Outcome: Adequate for Discharge 09/25/2022 0936 by Mancel Bale, RN Outcome: Progressing   Problem: Safety: Goal: Ability to remain free from injury will improve 09/25/2022 0937 by Mancel Bale, RN Outcome: Adequate for Discharge 09/25/2022 0936 by Mancel Bale, RN Outcome: Progressing   Problem: Skin Integrity: Goal: Risk for impaired skin integrity will decrease 09/25/2022 0937 by Mancel Bale, RN Outcome: Adequate for Discharge 09/25/2022 0936 by Mancel Bale, RN Outcome: Progressing

## 2022-09-25 NOTE — Progress Notes (Signed)
Pt refusing to wear cardiac monitor. RN explained reasoning for cardiac monitoring to patient given admitting diagnosis of seizures, pt verbalized understanding but continues to refuse cardiac monitoring.

## 2022-09-27 ENCOUNTER — Telehealth: Payer: Self-pay

## 2022-09-27 NOTE — Telephone Encounter (Signed)
Transition Care Management Unsuccessful Follow-up Telephone Call  Date of discharge and from where:  [place] 09/25/2022  Attempts:  1st Attempt  Reason for unsuccessful TCM follow-up call:  Left voice message Juanda Crumble, [place] Direct Dial [phone]

## 2022-09-28 NOTE — Telephone Encounter (Unsigned)
Transition Care Management Unsuccessful Follow-up Telephone Call  Date of discharge and from where:  [place] 09/25/2022  Attempts:  2nd Attempt  Reason for unsuccessful TCM follow-up call:  Left voice message Juanda Crumble, LPN [place] Direct Dial [phone]  ATTEMPT : 3 Transition Care Management Unsuccessful Follow-up Telephone Call  Date of discharge and from where:  [place] 09/25/2022  Attempts:  3rd Attempt  Reason for unsuccessful TCM follow-up call:  Left voice message, unable to contact patient Juanda Crumble, [place] Direct Dial [phone]

## 2022-10-05 ENCOUNTER — Ambulatory Visit (INDEPENDENT_AMBULATORY_CARE_PROVIDER_SITE_OTHER): Payer: BC Managed Care – PPO | Admitting: Internal Medicine

## 2022-10-05 ENCOUNTER — Encounter: Payer: Self-pay | Admitting: Internal Medicine

## 2022-10-05 VITALS — BP 118/84 | HR 129 | Temp 99.1°F | Resp 16 | Ht 67.0 in | Wt 183.6 lb

## 2022-10-05 DIAGNOSIS — R000 Tachycardia, unspecified: Secondary | ICD-10-CM

## 2022-10-05 DIAGNOSIS — Z09 Encounter for follow-up examination after completed treatment for conditions other than malignant neoplasm: Secondary | ICD-10-CM

## 2022-10-05 DIAGNOSIS — F419 Anxiety disorder, unspecified: Secondary | ICD-10-CM | POA: Diagnosis not present

## 2022-10-05 DIAGNOSIS — Z789 Other specified health status: Secondary | ICD-10-CM

## 2022-10-05 DIAGNOSIS — R251 Tremor, unspecified: Secondary | ICD-10-CM | POA: Diagnosis not present

## 2022-10-05 MED ORDER — ESCITALOPRAM OXALATE 20 MG PO TABS: 20.0000 mg | ORAL_TABLET | Freq: Every day | ORAL | 0 refills | Status: DC

## 2022-10-05 NOTE — Addendum Note (Signed)
Addended by: Margarita Mail on: 10/05/2022 03:51 PM   Modules accepted: Level of Service

## 2022-10-05 NOTE — Progress Notes (Signed)
Established Patient Office Visit  Subjective:  Patient ID: Melvin Knight, male    DOB: 05/23/1981  Age: 41 y.o. MRN: [id number]  CC:  Chief Complaint  Patient presents with   Hospitalization Follow-up    HPI Melvin Knight presents for hospital follow up.   Discharge Date: 09/25/22 Diagnosis: Seizures, HTN, Alcohol abuse, anxiety Procedures/tests: EEG normal, MRI brain negative  Consultants: Neurology  Discontinued medications: None Discharge instructions:  Follow up with Neurology, recheck BMP Status: stable  Patient states he was staying with his aunt and uncle when he "blacked out" and was found on the floor.  He states he had lost consciousness for about 5 to 10 seconds.  His uncle was concerned that the patient had had a seizure which is why he went to the hospital.  It happened at home twice.  He did not have any seizures or loss of consciousness events while inpatient.  He has not had any of these events since his discharge.  He is staying at home where he lives by himself currently.  He does feel like his stress and anxiety is greatly increased.  Hypertension: -Medications: Benazepril-HCTZ 20-12.5 mg  -Checking BP at home (average): Average 130-150/80-90 -Denies any SOB, CP, vision changes, LE edema or symptoms of hypotension. Continues to endorse generalized weakness although this has decreased in severity and in frequency.  Elevated LFTs/Alcohol Use Disorder: -CMP 01/20/2022 with ALT 59, AST 64, repeat LFT's in June high as well.  Most recent LFTs from 11/23 show AST 105, ALT 75 -Had been drinking 5-6 mixed drinks a day on most days, states he is gradually decreasing his alcohol intake and has lost about 15 pounds in the last 2 to 3 weeks due to this change.  Weakness: -Intermittent for multiple months now, overall appears to be getting better but still present -Weakness episodes involve symmetrical weakness in both lower and upper extremities.  Does not occur any certain  time of day, will "just turn on".  The weakness makes him feel like his legs/arms will give out.  Will occur at rest or with activity.  He also has tremors in his hands he is feeling weak. -Has had a history of low potassium and magnesium.  He is currently on a multivitamin and B complex vitamin, as well as folate and thiamine -Seen by Neurology who ordered EMG  -Was seen by neurology at [facility], who stated his symptoms was due to stress and recommended he take some time off of work -Still having tremors   Stress/MDD: -Mood status: Better currently, had been doing really well on his Lexapro -Current treatment: Lexapro 10 mg daily, hydroxyzine 10 mg as needed. Overall tolerating well, denies side effects.     10/05/2022   11:22 AM 08/27/2022    8:01 AM 07/16/2022    8:10 AM 05/10/2022    8:07 AM 03/08/2022    8:05 AM  Depression screen PHQ 2/9  Decreased Interest 0 0 0 0 1  Down, Depressed, Hopeless 0 0 0 0 1  PHQ - 2 Score 0 0 0 0 2  Altered sleeping 0 0 0 0 0  Tired, decreased energy 0 0 3 0 0  Change in appetite 0 0 0 0 0  Feeling bad or failure about yourself  0 0 0 0 0  Trouble concentrating 0 0 0 0 0  Moving slowly or fidgety/restless 0 0 0 0 0  Suicidal thoughts 0 0 0 0 0  PHQ-9 Score 0 0 3 0  2  Difficult doing work/chores Not difficult at all Not difficult at all  Not difficult at all Not difficult at all    Past Medical History:  Diagnosis Date   AKI (acute kidney injury) ([place]) 09/24/2022   Anxiety    Hypertension    Hypokalemia    Hypokalemia 09/24/2022   Hypomagnesemia 09/24/2022    History reviewed. No pertinent surgical history.  Family History  Problem Relation Age of Onset   Hypertension Father    Stroke Father    Cancer Sister     Social History   Socioeconomic History   Marital status: Single    Spouse name: Not on file   Number of children: Not on file   Years of education: Not on file   Highest education level: Not on file  Occupational History    Not on file  Tobacco Use   Smoking status: Every Day    Packs/day: 1.00    Types: Cigarettes   Smokeless tobacco: Never  Vaping Use   Vaping Use: Never used  Substance and Sexual Activity   Alcohol use: Yes    Alcohol/week: 6.0 standard drinks of alcohol    Types: 6 Shots of liquor per week    Comment: 6 cocktails per day   Drug use: Not Currently   Sexual activity: Not Currently  Other Topics Concern   Not on file  Social History Narrative   Right handed    Social Determinants of Health   Financial Resource Strain: Not on file  Food Insecurity: No Food Insecurity (09/24/2022)   Hunger Vital Sign    Worried About Running Out of Food in the Last Year: Never true    Ran Out of Food in the Last Year: Never true  Transportation Needs: No Transportation Needs (09/24/2022)   PRAPARE - Hydrologist (Medical): No    Lack of Transportation (Non-Medical): No  Physical Activity: Not on file  Stress: Not on file  Social Connections: Socially Isolated (01/01/2022)   Social Connection and Isolation Panel [NHANES]    Frequency of Communication with Friends and Family: Three times a week    Frequency of Social Gatherings with Friends and Family: More than three times a week    Attends Religious Services: Never    Marine scientist or Organizations: No    Attends Archivist Meetings: Never    Marital Status: Never married  Intimate Partner Violence: Not At Risk (09/24/2022)   Humiliation, Afraid, Rape, and Kick questionnaire    Fear of Current or Ex-Partner: No    Emotionally Abused: No    Physically Abused: No    Sexually Abused: No    Outpatient Medications Prior to Visit  Medication Sig Dispense Refill   benazepril-hydrochlorthiazide (LOTENSIN HCT) 20-12.5 MG tablet Take 1 tablet by mouth daily. 180 tablet 3   escitalopram (LEXAPRO) 10 MG tablet Take 1 tablet (10 mg total) by mouth daily. 90 tablet 0   folic acid (FOLVITE) 1 MG tablet Take  1 tablet (1 mg total) by mouth daily. 30 tablet 2   hydrocortisone 1 % ointment Apply 1 Application topically 2 (two) times daily. 30 g 0   hydrOXYzine (ATARAX) 10 MG tablet TAKE 1 TABLET (10 MG TOTAL) BY MOUTH AS NEEDED FOR ANXIETY. 90 tablet 0   Multiple Vitamins-Minerals (MULTIVITAMIN WITH MINERALS) tablet Take 1 tablet by mouth daily. 90 tablet 3   thiamine (VITAMIN B-1) 100 MG tablet Take 1 tablet (100 mg  total) by mouth daily. 30 tablet 2   vitamin B-12 (CYANOCOBALAMIN) 100 MCG tablet Take 100 mcg by mouth daily.     No facility-administered medications prior to visit.    No Known Allergies  ROS Review of Systems  Constitutional:  Positive for fatigue. Negative for chills and fever.  Eyes:  Negative for visual disturbance.  Respiratory:  Negative for cough and shortness of breath.   Cardiovascular:  Negative for chest pain and palpitations.  Genitourinary:  Negative for dysuria and hematuria.  Neurological:  Positive for tremors and weakness. Negative for dizziness, light-headedness, numbness and headaches.  Psychiatric/Behavioral:  The patient is nervous/anxious.       Objective:    Physical Exam Constitutional:      Appearance: Normal appearance.  HENT:     Head: Normocephalic and atraumatic.  Eyes:     Conjunctiva/sclera: Conjunctivae normal.  Cardiovascular:     Rate and Rhythm: Regular rhythm. Tachycardia present.  Pulmonary:     Effort: Pulmonary effort is normal.     Breath sounds: Normal breath sounds.  Musculoskeletal:     Right lower leg: No edema.     Left lower leg: No edema.  Skin:    General: Skin is warm and dry.  Neurological:     General: No focal deficit present.     Mental Status: He is alert. Mental status is at baseline.     Comments: Tremor present in bilateral hands  Psychiatric:        Attention and Perception: Attention normal.        Mood and Affect: Mood is anxious.        Behavior: Behavior normal.     BP 118/84   Pulse (!) 129    Temp 99.1 F (37.3 C)   Resp 16   Ht 5' 7" (1.702 m)   Wt 183 lb 9.6 oz (83.3 kg)   SpO2 98%   BMI 28.76 kg/m  Wt Readings from Last 3 Encounters:  10/05/22 183 lb 9.6 oz (83.3 kg)  09/23/22 195 lb (88.5 kg)  08/27/22 195 lb 9.6 oz (88.7 kg)     Health Maintenance Due  Topic Date Due   COVID-19 Vaccine (2 - Booster for Janssen series) 03/29/2020     There are no preventive care reminders to display for this patient.  Lab Results  Component Value Date   TSH 1.15 01/22/2022   Lab Results  Component Value Date   WBC 5.8 09/24/2022   HGB 11.1 (L) 09/24/2022   HCT 30.8 (L) 09/24/2022   MCV 97.5 09/24/2022   PLT 148 (L) 09/24/2022   Lab Results  Component Value Date   NA 138 09/25/2022   K 4.0 09/25/2022   CO2 19 (L) 09/25/2022   GLUCOSE 92 09/25/2022   BUN 25 (H) 09/25/2022   CREATININE 1.15 09/25/2022   BILITOT 1.3 (H) 09/23/2022   ALKPHOS 94 09/23/2022   AST 105 (H) 09/23/2022   ALT 75 (H) 09/23/2022   PROT 7.9 09/23/2022   ALBUMIN 4.3 09/23/2022   CALCIUM 9.1 09/25/2022   ANIONGAP 8 09/25/2022   EGFR 78 07/16/2022   Lab Results  Component Value Date   CHOL 258 (H) 01/11/2022   Lab Results  Component Value Date   HDL 61 01/11/2022   Lab Results  Component Value Date   LDLCALC 166 (H) 01/11/2022   Lab Results  Component Value Date   TRIG 162 (H) 01/11/2022   Lab Results  Component Value Date  CHOLHDL 4.2 01/11/2022   Lab Results  Component Value Date   HGBA1C 5.7 (H) 02/08/2022      Assessment & Plan:   1. Hospital discharge follow-up/Alcohol use: Hospital discharge summary from 09/25/2022 reviewed as well as EEG, MRI of the brain, labs and EKG.  Patient was admitted for alcohol withdrawal seizures and was started on folate and thiamine as well as his vitamin B12.  Plan to recheck all vitamins and BMP at follow-up in 3 days.  2. Anxiety: Exacerbated.  Increase Lexapro to 20 mg daily.  He does have hydroxyzine to take as needed.  He does  have a psychiatry scheduled.  - escitalopram (LEXAPRO) 20 MG tablet; Take 1 tablet (20 mg total) by mouth daily.  Dispense: 90 tablet; Refill: 0  3. Tremor: Reviewed note from neurology at [facility] on 09/06/2025 where it was suggested his symptoms were due to stress and continue to take time off work.  He has been out of work for 2 months at this point.  4. Tachycardia: Sinus tachycardia on EKG from 09/23/2022.  I do believe this is secondary to his uncontrolled anxiety.  Lexapro increased to 20 mg today, plan to start low-dose beta-blocker at follow-up.   Follow-up: Return for already scheduled .    [name], DO

## 2022-10-08 ENCOUNTER — Encounter: Payer: Self-pay | Admitting: Internal Medicine

## 2022-10-08 ENCOUNTER — Ambulatory Visit: Payer: BC Managed Care – PPO | Admitting: Internal Medicine

## 2022-10-08 VITALS — BP 110/70 | HR 108 | Temp 98.3°F | Resp 16 | Ht 67.0 in | Wt 184.9 lb

## 2022-10-08 DIAGNOSIS — I10 Essential (primary) hypertension: Secondary | ICD-10-CM

## 2022-10-08 DIAGNOSIS — F419 Anxiety disorder, unspecified: Secondary | ICD-10-CM | POA: Diagnosis not present

## 2022-10-08 DIAGNOSIS — Z7689 Persons encountering health services in other specified circumstances: Secondary | ICD-10-CM

## 2022-10-08 DIAGNOSIS — R000 Tachycardia, unspecified: Secondary | ICD-10-CM

## 2022-10-08 MED ORDER — METOPROLOL SUCCINATE ER 25 MG PO TB24: 12.5000 mg | ORAL_TABLET | Freq: Every day | ORAL | 0 refills | Status: DC

## 2022-10-08 NOTE — Progress Notes (Signed)
Established Patient Office Visit  Subjective:  Patient ID: Melvin Knight, male    DOB: [birth year]  Age: 41 y.o. MRN: [id number]  CC:  Chief Complaint  Patient presents with   Follow-up   Anxiety    HPI Melvin Knight presents for paperwork. Patient was recently in the hospital for weakness, loss of consciousness and stress. It was thought he was potentially having seizure like activity and alcohol withdrawal, EEG and MRI of the brain negative. He was evaluated by Neurology at [facility] who thought his symptoms was stress related and he has been off of work for the last month. Today's visit it to evaluate if it is appropriate for him to return to work.  He states since he has been home, he has not lost consciousness and his tremors have improved.    Hypertension: -Medications: Benazepril-HCTZ 20-12.5 mg  -Checking BP at home (average): Average 130-150/80-90 -Denies any SOB, CP, vision changes, LE edema or symptoms of hypotension.   Elevated LFTs/Alcohol Use Disorder: -CMP 01/20/2022 with ALT 59, AST 64, repeat LFT's in June high as well.  Most recent LFTs from 11/23 show AST 105, ALT 75 -Had been drinking 5-6 mixed drinks a day on most days, states he is gradually decreasing his alcohol intake and has lost about 15 pounds in the last 2 to 3 weeks due to this change.  Weakness: -Intermittent for multiple months now, overall appears to be getting better but still present -Weakness episodes involve symmetrical weakness in both lower and upper extremities.  Does not occur any certain time of day, will "just turn on".  The weakness makes him feel like his legs/arms will give out.  Will occur at rest or with activity.  He also has tremors in his hands he is feeling weak. -Has had a history of low potassium and magnesium.  He is currently on a multivitamin and B complex vitamin, as well as folate and thiamine -Was seen by neurology at [facility], who stated his symptoms was due to stress and recommended he take  some time off of work. The patient has been off work for the last month.    Stress/MDD: -Mood status: Better currently, had been doing really well on his Lexapro -Current treatment: Lexapro 20 mg daily, hydroxyzine 10 mg as needed. Overall tolerating well, denies side effects.     10/08/2022    7:59 AM 10/05/2022   11:22 AM 08/27/2022    8:01 AM 07/16/2022    8:10 AM 05/10/2022    8:07 AM  Depression screen PHQ 2/9  Decreased Interest 0 0 0 0 0  Down, Depressed, Hopeless 0 0 0 0 0  PHQ - 2 Score 0 0 0 0 0  Altered sleeping 0 0 0 0 0  Tired, decreased energy 0 0 0 3 0  Change in appetite 0 0 0 0 0  Feeling bad or failure about yourself  0 0 0 0 0  Trouble concentrating 0 0 0 0 0  Moving slowly or fidgety/restless 0 0 0 0 0  Suicidal thoughts 0 0 0 0 0  PHQ-9 Score 0 0 0 3 0  Difficult doing work/chores Not difficult at all Not difficult at all Not difficult at all  Not difficult at all    Past Medical History:  Diagnosis Date   AKI (acute kidney injury) ([place]) 09/24/2022   Anxiety    Hypertension    Hypokalemia    Hypokalemia 09/24/2022   Hypomagnesemia 09/24/2022    No past  surgical history on file.  Family History  Problem Relation Age of Onset   Hypertension Father    Stroke Father    Cancer Sister     Social History   Socioeconomic History   Marital status: Single    Spouse name: Not on file   Number of children: Not on file   Years of education: Not on file   Highest education level: Not on file  Occupational History   Not on file  Tobacco Use   Smoking status: Every Day    Packs/day: 1.00    Types: Cigarettes   Smokeless tobacco: Never  Vaping Use   Vaping Use: Never used  Substance and Sexual Activity   Alcohol use: Yes    Alcohol/week: 6.0 standard drinks of alcohol    Types: 6 Shots of liquor per week    Comment: 6 cocktails per day   Drug use: Not Currently   Sexual activity: Not Currently  Other Topics Concern   Not on file  Social  History Narrative   Right handed    Social Determinants of Health   Financial Resource Strain: Not on file  Food Insecurity: No Food Insecurity (09/24/2022)   Hunger Vital Sign    Worried About Running Out of Food in the Last Year: Never true    Ran Out of Food in the Last Year: Never true  Transportation Needs: No Transportation Needs (09/24/2022)   PRAPARE - Hydrologist (Medical): No    Lack of Transportation (Non-Medical): No  Physical Activity: Not on file  Stress: Not on file  Social Connections: Socially Isolated (01/01/2022)   Social Connection and Isolation Panel [NHANES]    Frequency of Communication with Friends and Family: Three times a week    Frequency of Social Gatherings with Friends and Family: More than three times a week    Attends Religious Services: Never    Marine scientist or Organizations: No    Attends Archivist Meetings: Never    Marital Status: Never married  Intimate Partner Violence: Not At Risk (09/24/2022)   Humiliation, Afraid, Rape, and Kick questionnaire    Fear of Current or Ex-Partner: No    Emotionally Abused: No    Physically Abused: No    Sexually Abused: No    Outpatient Medications Prior to Visit  Medication Sig Dispense Refill   benazepril-hydrochlorthiazide (LOTENSIN HCT) 20-12.5 MG tablet Take 1 tablet by mouth daily. 180 tablet 3   escitalopram (LEXAPRO) 20 MG tablet Take 1 tablet (20 mg total) by mouth daily. 90 tablet 0   folic acid (FOLVITE) 1 MG tablet Take 1 tablet (1 mg total) by mouth daily. 30 tablet 2   hydrocortisone 1 % ointment Apply 1 Application topically 2 (two) times daily. 30 g 0   hydrOXYzine (ATARAX) 10 MG tablet TAKE 1 TABLET (10 MG TOTAL) BY MOUTH AS NEEDED FOR ANXIETY. 90 tablet 0   Multiple Vitamins-Minerals (MULTIVITAMIN WITH MINERALS) tablet Take 1 tablet by mouth daily. 90 tablet 3   thiamine (VITAMIN B-1) 100 MG tablet Take 1 tablet (100 mg total) by mouth daily. 30  tablet 2   vitamin B-12 (CYANOCOBALAMIN) 100 MCG tablet Take 100 mcg by mouth daily.     No facility-administered medications prior to visit.    No Known Allergies  ROS Review of Systems  Constitutional:  Negative for chills, fatigue and fever.  Eyes:  Negative for visual disturbance.  Respiratory:  Negative for cough  and shortness of breath.   Cardiovascular:  Negative for chest pain and palpitations.  Genitourinary:  Negative for dysuria and hematuria.  Neurological:  Positive for tremors. Negative for dizziness, weakness, light-headedness, numbness and headaches.      Objective:    Physical Exam Constitutional:      Appearance: Normal appearance.  HENT:     Head: Normocephalic and atraumatic.  Eyes:     Conjunctiva/sclera: Conjunctivae normal.  Cardiovascular:     Rate and Rhythm: Regular rhythm. Tachycardia present.  Pulmonary:     Effort: Pulmonary effort is normal.     Breath sounds: Normal breath sounds.  Musculoskeletal:     Right lower leg: No edema.     Left lower leg: No edema.  Skin:    General: Skin is warm and dry.  Neurological:     General: No focal deficit present.     Mental Status: He is alert. Mental status is at baseline.  Psychiatric:        Attention and Perception: Attention normal.        Mood and Affect: Mood normal.        Behavior: Behavior normal.     BP 110/70   Pulse (!) 108   Temp 98.3 F (36.8 C) (Oral)   Resp 16   Ht _0  (1.702 m)   Wt 184 lb 14.4 oz (83.9 kg)   SpO2 97%   BMI 28.96 kg/m  Wt Readings from Last 3 Encounters:  10/08/22 184 lb 14.4 oz (83.9 kg)  10/05/22 183 lb 9.6 oz (83.3 kg)  09/23/22 195 lb (88.5 kg)     There are no preventive care reminders to display for this patient.    There are no preventive care reminders to display for this patient.  Lab Results  Component Value Date   TSH 1.15 01/22/2022   Lab Results  Component Value Date   WBC 5.8 09/24/2022   HGB 11.1 (L) 09/24/2022   HCT  30.8 (L) 09/24/2022   MCV 97.5 09/24/2022   PLT 148 (L) 09/24/2022   Lab Results  Component Value Date   NA 138 09/25/2022   K 4.0 09/25/2022   CO2 19 (L) 09/25/2022   GLUCOSE 92 09/25/2022   BUN 25 (H) 09/25/2022   CREATININE 1.15 09/25/2022   BILITOT 1.3 (H) 09/23/2022   ALKPHOS 94 09/23/2022   AST 105 (H) 09/23/2022   ALT 75 (H) 09/23/2022   PROT 7.9 09/23/2022   ALBUMIN 4.3 09/23/2022   CALCIUM 9.1 09/25/2022   ANIONGAP 8 09/25/2022   EGFR 78 07/16/2022   Lab Results  Component Value Date   CHOL 258 (H) 01/11/2022   Lab Results  Component Value Date   HDL 61 01/11/2022   Lab Results  Component Value Date   LDLCALC 166 (H) 01/11/2022   Lab Results  Component Value Date   TRIG 162 (H) 01/11/2022   Lab Results  Component Value Date   CHOLHDL 4.2 01/11/2022   Lab Results  Component Value Date   HGBA1C 5.7 (H) 02/08/2022      Assessment & Plan:   1. Return to work evaluation/Anxiety/Tachycardia/Primary hypertension: Patient may return to work to full duty starting Monday 10/11/22. Forms filled out and returned to patient. Patient's work also requiring a letter stating that the patient can work remotely as needed and can drive to [employer] area as needed for work. Based on evaluation today this was provided, however I reserve all rights to revoke driving privileges  based on future exams. Patient understands and agrees. He will continue Lexapro 20 mg, Hydroxyzine as needed for anxiety. Metoprolol 12.5 mg started today as well for tachycardia and anxiety. He will continue current blood pressure medication Benazepril-HCTZ 20-12.5 mg. Follow up in 1 month to recheck.      Follow-up: Return in about 4 weeks (around 11/05/2022).    [name], DO

## 2022-10-08 NOTE — Patient Instructions (Signed)
It was great seeing you today!  Plan discussed at today's visit: -Blood work ordered today, results will be uploaded to MyChart.   Follow up in:  Take care and let us know if you have any questions or concerns prior to your next visit.  Dr. Coreyon Nicotra  

## 2022-11-04 NOTE — Progress Notes (Signed)
Established Patient Office Visit  Subjective:  Patient ID: Melvin Knight, male    DOB: March 20, 1981  Age: 41 y.o. MRN: [id number]  CC:  Chief Complaint  Patient presents with   Follow-up    HPI Melvin Knight presents for follow up. Patient is here with his uncle today.  . Patient was recently in the hospital for weakness, loss of consciousness and stress. It was thought he was potentially having seizure like activity and alcohol withdrawal, EEG and MRI of the brain negative. He was evaluated by Neurology at [facility] who thought his symptoms was stress related and he has been off of work for the last month. Today's visit it to evaluate if it is appropriate for him to return to work.  He states since he has been home, he has not lost consciousness and his tremors have improved.    Hypertension: -Medications: Benazepril-HCTZ 20-12.5 mg, Metoprolol 12.5 mg daily  -Checking BP at home (average): Average 130-150/80-90 -Denies any SOB, CP, vision changes, LE edema or symptoms of hypotension.   Elevated LFTs/Alcohol Use Disorder: -CMP 01/20/2022 with ALT 59, AST 64, repeat LFT's in June high as well.  Most recent LFTs from 11/23 show AST 105, ALT 75 -Had been drinking 5-6 mixed drinks a day on most days now down to 3 mixed drinks a day -Recently admitted for alcohol withdrawal seizures in 10/05/22  Weakness/Tremor: -Intermittent for multiple months now, exacerbated again today -Weakness episodes involve symmetrical weakness in both lower and upper extremities.  Does not occur any certain time of day, will "just turn on".  The weakness makes him feel like his legs/arms will give out.  Will occur at rest or with activity.  He also has tremors in his hands he is feeling weak. -Has had a history of low potassium and magnesium.  He is currently on a multivitamin and B complex vitamin, as well as folate and thiamine -Was seen by neurology at [facility], who stated his symptoms was due to stress and recommended he  take some time off of work. The patient has been off work for the entire month of October, recently went back to work and symptoms returned.   Stress/MDD: -Mood status: Worse -Current treatment: Lexapro 20 mg daily, hydroxyzine 10 mg as needed. Started on Metoprolol 12.5 mg daily at [place]. Overall tolerating well, denies side effects but states tremors, weakness and anxiety still present     11/05/2022    9:01 AM 10/08/2022    7:59 AM 10/05/2022   11:22 AM 08/27/2022    8:01 AM 07/16/2022    8:10 AM  Depression screen PHQ 2/9  Decreased Interest 0 0 0 0 0  Down, Depressed, Hopeless 0 0 0 0 0  PHQ - 2 Score 0 0 0 0 0  Altered sleeping 0 0 0 0 0  Tired, decreased energy 0 0 0 0 3  Change in appetite 0 0 0 0 0  Feeling bad or failure about yourself  0 0 0 0 0  Trouble concentrating 0 0 0 0 0  Moving slowly or fidgety/restless 0 0 0 0 0  Suicidal thoughts 0 0 0 0 0  PHQ-9 Score 0 0 0 0 3  Difficult doing work/chores Not difficult at all Not difficult at all Not difficult at all Not difficult at all     Past Medical History:  Diagnosis Date   AKI (acute kidney injury) ([place]) 09/24/2022   Anxiety    Hypertension    Hypokalemia  Hypokalemia 09/24/2022   Hypomagnesemia 09/24/2022    History reviewed. No pertinent surgical history.  Family History  Problem Relation Age of Onset   Hypertension Father    Stroke Father    Cancer Sister     Social History   Socioeconomic History   Marital status: Single    Spouse name: Not on file   Number of children: Not on file   Years of education: Not on file   Highest education level: Not on file  Occupational History   Not on file  Tobacco Use   Smoking status: Every Day    Packs/day: 1.00    Types: Cigarettes   Smokeless tobacco: Never  Vaping Use   Vaping Use: Never used  Substance and Sexual Activity   Alcohol use: Yes    Alcohol/week: 6.0 standard drinks of alcohol    Types: 6 Shots of liquor per week    Comment: 6  cocktails per day   Drug use: Not Currently   Sexual activity: Not Currently  Other Topics Concern   Not on file  Social History Narrative   Right handed    Social Determinants of Health   Financial Resource Strain: Not on file  Food Insecurity: No Food Insecurity (09/24/2022)   Hunger Vital Sign    Worried About Running Out of Food in the Last Year: Never true    Ran Out of Food in the Last Year: Never true  Transportation Needs: No Transportation Needs (09/24/2022)   PRAPARE - Hydrologist (Medical): No    Lack of Transportation (Non-Medical): No  Physical Activity: Not on file  Stress: Not on file  Social Connections: Socially Isolated (01/01/2022)   Social Connection and Isolation Panel [NHANES]    Frequency of Communication with Friends and Family: Three times a week    Frequency of Social Gatherings with Friends and Family: More than three times a week    Attends Religious Services: Never    Marine scientist or Organizations: No    Attends Archivist Meetings: Never    Marital Status: Never married  Intimate Partner Violence: Not At Risk (09/24/2022)   Humiliation, Afraid, Rape, and Kick questionnaire    Fear of Current or Ex-Partner: No    Emotionally Abused: No    Physically Abused: No    Sexually Abused: No    Outpatient Medications Prior to Visit  Medication Sig Dispense Refill   benazepril-hydrochlorthiazide (LOTENSIN HCT) 20-12.5 MG tablet Take 1 tablet by mouth daily. 153 tablet 3   folic acid (FOLVITE) 1 MG tablet Take 1 tablet (1 mg total) by mouth daily. 30 tablet 2   hydrocortisone 1 % ointment Apply 1 Application topically 2 (two) times daily. 30 g 0   hydrOXYzine (ATARAX) 10 MG tablet TAKE 1 TABLET (10 MG TOTAL) BY MOUTH AS NEEDED FOR ANXIETY. 90 tablet 0   Multiple Vitamins-Minerals (MULTIVITAMIN WITH MINERALS) tablet Take 1 tablet by mouth daily. 90 tablet 3   thiamine (VITAMIN B-1) 100 MG tablet Take 1 tablet  (100 mg total) by mouth daily. 30 tablet 2   vitamin B-12 (CYANOCOBALAMIN) 100 MCG tablet Take 100 mcg by mouth daily.     escitalopram (LEXAPRO) 20 MG tablet Take 1 tablet (20 mg total) by mouth daily. 90 tablet 0   metoprolol succinate (TOPROL-XL) 25 MG 24 hr tablet Take 0.5 tablets (12.5 mg total) by mouth daily. 30 tablet 0   No facility-administered medications prior to visit.  No Known Allergies  ROS Review of Systems  Constitutional:  Negative for chills and fever.  Eyes:  Negative for visual disturbance.  Respiratory:  Negative for cough and shortness of breath.   Cardiovascular:  Negative for chest pain and palpitations.  Neurological:  Positive for tremors and weakness. Negative for dizziness, light-headedness, numbness and headaches.  Psychiatric/Behavioral:  The patient is nervous/anxious.       Objective:    Physical Exam Constitutional:      Appearance: Normal appearance.  HENT:     Head: Normocephalic and atraumatic.  Eyes:     Conjunctiva/sclera: Conjunctivae normal.  Cardiovascular:     Rate and Rhythm: Regular rhythm. Tachycardia present.  Pulmonary:     Effort: Pulmonary effort is normal.     Breath sounds: Normal breath sounds.  Skin:    General: Skin is warm and dry.  Neurological:     Mental Status: He is alert. Mental status is at baseline.     Comments: Bilateral upper extremity intention tremor present  Psychiatric:        Attention and Perception: Attention normal.        Mood and Affect: Mood normal.        Behavior: Behavior normal.     BP 118/72   Pulse (!) 110   Temp 98.4 F (36.9 C)   Resp 18   Ht _0  (1.702 m)   Wt 187 lb (84.8 kg)   SpO2 94%   BMI 29.29 kg/m  Wt Readings from Last 3 Encounters:  11/05/22 187 lb (84.8 kg)  10/08/22 184 lb 14.4 oz (83.9 kg)  10/05/22 183 lb 9.6 oz (83.3 kg)     There are no preventive care reminders to display for this patient.     There are no preventive care reminders to display  for this patient.  Lab Results  Component Value Date   TSH 1.15 01/22/2022   Lab Results  Component Value Date   WBC 5.8 09/24/2022   HGB 11.1 (L) 09/24/2022   HCT 30.8 (L) 09/24/2022   MCV 97.5 09/24/2022   PLT 148 (L) 09/24/2022   Lab Results  Component Value Date   NA 138 09/25/2022   K 4.0 09/25/2022   CO2 19 (L) 09/25/2022   GLUCOSE 92 09/25/2022   BUN 25 (H) 09/25/2022   CREATININE 1.15 09/25/2022   BILITOT 1.3 (H) 09/23/2022   ALKPHOS 94 09/23/2022   AST 105 (H) 09/23/2022   ALT 75 (H) 09/23/2022   PROT 7.9 09/23/2022   ALBUMIN 4.3 09/23/2022   CALCIUM 9.1 09/25/2022   ANIONGAP 8 09/25/2022   EGFR 78 07/16/2022   Lab Results  Component Value Date   CHOL 258 (H) 01/11/2022   Lab Results  Component Value Date   HDL 61 01/11/2022   Lab Results  Component Value Date   LDLCALC 166 (H) 01/11/2022   Lab Results  Component Value Date   TRIG 162 (H) 01/11/2022   Lab Results  Component Value Date   CHOLHDL 4.2 01/11/2022   Lab Results  Component Value Date   HGBA1C 5.7 (H) 02/08/2022      Assessment & Plan:   1. Alcohol use disorder, moderate, dependence (HCC)/Weakness/Tremor/Anxiety/Tachycardia: The majority of the patient's symptoms are due to his alcohol use.  Patient admitted to the hospital last month for acute alcohol withdrawal seizures.  Patient still drinking about 3 mixed drinks a day.  Patient to see neurology again next month, brain MRI and CT of  the head from November reviewed today.  The patient is on Lexapro 20 mg and hydroxyzine as needed. Lexapro will be refilled today.  I am sending a low-dose Ativan to have in case of acute alcohol withdrawal seizures, I discussed this is for emergencies only and can be habit-forming and I will not refill the medication.  Patient verbalizes understanding.  Referral to psychiatry placed but discussed with the patient the need for either outpatient or inpatient rehab services.  As his weakness and tremors are  exacerbated today, we will obtain labs to look for electrolyte disturbances, vitamin deficiencies.  Increase metoprolol to 25 mg daily for both tachycardia and tremor.  Follow-up in 1 month or sooner as needed.  - LORazepam (ATIVAN) 0.5 MG tablet; Take 1 tablet (0.5 mg total) by mouth as needed for seizure.  Dispense: 10 tablet; Refill: 0 - Ambulatory referral to Psychiatry - B12 and Folate Panel - Vitamin B1 - COMPLETE METABOLIC PANEL WITH GFR - Magnesium - CBC w/Diff/Platelet - metoprolol succinate (TOPROL-XL) 25 MG 24 hr tablet; Take 1 tablet (25 mg total) by mouth daily.  Dispense: 30 tablet; Refill: 1 - escitalopram (LEXAPRO) 20 MG tablet; Take 1 tablet (20 mg total) by mouth daily.  Dispense: 90 tablet; Refill: 0   Follow-up: Return in about 4 weeks (around 12/03/2022).    [name], DO

## 2022-11-05 ENCOUNTER — Encounter: Payer: Self-pay | Admitting: Internal Medicine

## 2022-11-05 ENCOUNTER — Ambulatory Visit: Payer: BC Managed Care – PPO | Admitting: Internal Medicine

## 2022-11-05 VITALS — BP 118/72 | HR 110 | Temp 98.4°F | Resp 18 | Ht 67.0 in | Wt 187.0 lb

## 2022-11-05 DIAGNOSIS — R251 Tremor, unspecified: Secondary | ICD-10-CM | POA: Diagnosis not present

## 2022-11-05 DIAGNOSIS — F1020 Alcohol dependence, uncomplicated: Secondary | ICD-10-CM | POA: Diagnosis not present

## 2022-11-05 DIAGNOSIS — R531 Weakness: Secondary | ICD-10-CM

## 2022-11-05 DIAGNOSIS — F419 Anxiety disorder, unspecified: Secondary | ICD-10-CM | POA: Diagnosis not present

## 2022-11-05 DIAGNOSIS — R000 Tachycardia, unspecified: Secondary | ICD-10-CM

## 2022-11-05 MED ORDER — ESCITALOPRAM OXALATE 20 MG PO TABS: 20.0000 mg | ORAL_TABLET | Freq: Every day | ORAL | 0 refills | Status: DC

## 2022-11-05 MED ORDER — METOPROLOL SUCCINATE ER 25 MG PO TB24: 25.0000 mg | ORAL_TABLET | Freq: Every day | ORAL | 1 refills | Status: DC

## 2022-11-05 MED ORDER — LORAZEPAM 0.5 MG PO TABS: 0.5000 mg | ORAL_TABLET | ORAL | 0 refills | Status: DC | PRN

## 2022-11-05 NOTE — Patient Instructions (Addendum)
It was great seeing you today!  Plan discussed at today's visit: -Continue Metoprolol 25 mg daily to help with heart rate, anxiety and tremors -Discuss MRI with Neurology at appointment -Referral placed to Psychiatry  -Seriously consider treatment for alcohol use - either inpatient or outpatient rehab. The medication I'm giving you is for emergencies only and will not be refilled. Only take it with onset of seizure. Continue to work on slowly decreasing alcohol use. Measure each ounce and decrease by 1 ounce until no longer drinking. If you have any issues you need to go to the emergency room.  Follow up in: 1 month   Take care and let us know if you have any questions or concerns prior to your next visit.  Dr. Caralee Ates

## 2022-11-09 ENCOUNTER — Other Ambulatory Visit
Admission: RE | Admit: 2022-11-09 | Discharge: 2022-11-09 | Disposition: A | Discharge status: Home / Self Care | Payer: BC Managed Care – PPO | Source: Ambulatory Visit | Attending: Internal Medicine | Admitting: Internal Medicine

## 2022-11-09 ENCOUNTER — Ambulatory Visit: Payer: Self-pay

## 2022-11-09 DIAGNOSIS — N179 Acute kidney failure, unspecified: Secondary | ICD-10-CM | POA: Diagnosis present

## 2022-11-09 LAB — BASIC METABOLIC PANEL
Anion gap: 16 — ABNORMAL HIGH (ref 5–15)
BUN: 26 mg/dL — ABNORMAL HIGH (ref 6–20)
CO2: 20 mmol/L — ABNORMAL LOW (ref 22–32)
Calcium: 9.2 mg/dL (ref 8.9–10.3)
Chloride: 105 mmol/L (ref 98–111)
Creatinine, Ser: 0.96 mg/dL (ref 0.61–1.24)
GFR, Estimated: >60 mL/min (ref >60)
Glucose, Bld: 97 mg/dL (ref 70–99)
Potassium: 3.5 mmol/L (ref 3.5–5.1)
Sodium: 141 mmol/L (ref 135–145)

## 2022-11-09 NOTE — Addendum Note (Signed)
Addended by: Davene Costain on: 11/09/2022 09:37 AM   Modules accepted: Orders

## 2022-11-09 NOTE — Telephone Encounter (Signed)
Called office and advised Cassandra of call. Cassandra stated she saw the note and is forwarding back to clinical staff now.

## 2022-11-09 NOTE — Telephone Encounter (Signed)
Pt stated he was advised to go to ED on Sunday. Pt stated he did not go. Pt asking if labs can be repeated and if his labs are still bad he will go to hospital. Discussed his results versus nirmal results and advised pt that labs may not have improved in a a manner of days. Advised pt that even though he stated he feels ok, his labwork indicates his kidneys are not well. Pt refused to go to ED and is waiting to hear back.  Answer Assessment - Initial Assessment Questions 1. REASON FOR CALL or QUESTION: "What is your reason for calling today?" or "How can I best help you?" or "What question do you have that I can help answer?"     Does not want to go to hospital and wants BW repeated.  Protocols used: Information Only Call - No Triage-A-AH

## 2022-11-10 LAB — CBC WITH DIFFERENTIAL/PLATELET
Absolute Monocytes: 655 cells/uL (ref 200–950)
Basophils Absolute: 109 cells/uL (ref 0–200)
Basophils Relative: 2.1 %
Eosinophils Absolute: 130 cells/uL (ref 15–500)
Eosinophils Relative: 2.5 %
HCT: 39.4 % (ref 38.5–50.0)
Hemoglobin: 13.7 g/dL (ref 13.2–17.1)
Lymphs Abs: 1794 cells/uL (ref 850–3900)
MCH: 36.3 pg — ABNORMAL HIGH (ref 27.0–33.0)
MCHC: 34.8 g/dL (ref 32.0–36.0)
MCV: 104.5 fL — ABNORMAL HIGH (ref 80.0–100.0)
MPV: 10.4 fL (ref 7.5–12.5)
Monocytes Relative: 12.6 %
Neutro Abs: 2512 cells/uL (ref 1500–7800)
Neutrophils Relative %: 48.3 %
Platelets: 185 10*3/uL (ref 140–400)
RBC: 3.77 10*6/uL — ABNORMAL LOW (ref 4.20–5.80)
RDW: 13 % (ref 11.0–15.0)
Total Lymphocyte: 34.5 %
WBC: 5.2 10*3/uL (ref 3.8–10.8)

## 2022-11-10 LAB — COMPLETE METABOLIC PANEL WITH GFR
AG Ratio: 1.5 (calc) (ref 1.0–2.5)
ALT: 39 U/L (ref 9–46)
AST: 66 U/L — ABNORMAL HIGH (ref 10–40)
Albumin: 4.7 g/dL (ref 3.6–5.1)
Alkaline phosphatase (APISO): 95 U/L (ref 36–130)
BUN/Creatinine Ratio: 9 (calc) (ref 6–22)
BUN: 40 mg/dL — ABNORMAL HIGH (ref 7–25)
CO2: 24 mmol/L (ref 20–32)
Calcium: 8.9 mg/dL (ref 8.6–10.3)
Chloride: 100 mmol/L (ref 98–110)
Creat: 4.66 mg/dL — ABNORMAL HIGH (ref 0.60–1.26)
Globulin: 3.2 g/dL (calc) (ref 1.9–3.7)
Glucose, Bld: 87 mg/dL (ref 65–99)
Potassium: 3.6 mmol/L (ref 3.5–5.3)
Sodium: 142 mmol/L (ref 135–146)
Total Bilirubin: 0.9 mg/dL (ref 0.2–1.2)
Total Protein: 7.9 g/dL (ref 6.1–8.1)
eGFR: 15 mL/min/{1.73_m2} — ABNORMAL LOW (ref >=60)

## 2022-11-10 LAB — VITAMIN B1: Vitamin B1 (Thiamine): 128 nmol/L — ABNORMAL HIGH (ref 8–30)

## 2022-11-10 LAB — MAGNESIUM: Magnesium: 1.7 mg/dL (ref 1.5–2.5)

## 2022-11-10 LAB — B12 AND FOLATE PANEL
Folate: >24 ng/mL
Vitamin B-12: 780 pg/mL (ref 200–1100)

## 2022-11-14 ENCOUNTER — Other Ambulatory Visit: Payer: Self-pay

## 2022-11-14 DIAGNOSIS — D539 Nutritional anemia, unspecified: Secondary | ICD-10-CM | POA: Insufficient documentation

## 2022-11-14 DIAGNOSIS — F10230 Alcohol dependence with withdrawal, uncomplicated: Secondary | ICD-10-CM | POA: Diagnosis not present

## 2022-11-14 DIAGNOSIS — E876 Hypokalemia: Secondary | ICD-10-CM | POA: Insufficient documentation

## 2022-11-14 DIAGNOSIS — R251 Tremor, unspecified: Secondary | ICD-10-CM | POA: Diagnosis present

## 2022-11-14 LAB — COMPREHENSIVE METABOLIC PANEL
ALT: 54 U/L — ABNORMAL HIGH (ref 0–44)
AST: 89 U/L — ABNORMAL HIGH (ref 15–41)
Albumin: 4.2 g/dL (ref 3.5–5.0)
Alkaline Phosphatase: 78 U/L (ref 38–126)
Anion gap: 12 (ref 5–15)
BUN: 14 mg/dL (ref 6–20)
CO2: 24 mmol/L (ref 22–32)
Calcium: 8.7 mg/dL — ABNORMAL LOW (ref 8.9–10.3)
Chloride: 105 mmol/L (ref 98–111)
Creatinine, Ser: 0.86 mg/dL (ref 0.61–1.24)
GFR, Estimated: >60 mL/min (ref >60)
Glucose, Bld: 162 mg/dL — ABNORMAL HIGH (ref 70–99)
Potassium: 3.4 mmol/L — ABNORMAL LOW (ref 3.5–5.1)
Sodium: 141 mmol/L (ref 135–145)
Total Bilirubin: 1.6 mg/dL — ABNORMAL HIGH (ref 0.3–1.2)
Total Protein: 7.5 g/dL (ref 6.5–8.1)

## 2022-11-14 LAB — CBC
HCT: 36.8 % — ABNORMAL LOW (ref 39.0–52.0)
Hemoglobin: 12.8 g/dL — ABNORMAL LOW (ref 13.0–17.0)
MCH: 35.3 pg — ABNORMAL HIGH (ref 26.0–34.0)
MCHC: 34.8 g/dL (ref 30.0–36.0)
MCV: 101.4 fL — ABNORMAL HIGH (ref 80.0–100.0)
Platelets: 156 10*3/uL (ref 150–400)
RBC: 3.63 MIL/uL — ABNORMAL LOW (ref 4.22–5.81)
RDW: 12.1 % (ref 11.5–15.5)
WBC: 5.2 10*3/uL (ref 4.0–10.5)
nRBC: 0 % (ref 0.0–0.2)

## 2022-11-14 LAB — ETHANOL: Alcohol, Ethyl (B): <10 mg/dL (ref <10)

## 2022-11-14 MED ORDER — LORAZEPAM 2 MG/ML IJ SOLN
1.0000 mg | Freq: Once | INTRAMUSCULAR | Status: AC
  Administered 2022-11-14: 1 mg via INTRAVENOUS
  Filled 2022-11-14: qty 1

## 2022-11-14 NOTE — ED Triage Notes (Addendum)
Pt arrives via POV with CC of intermittant tremors that has been ongoing for a year. Reports two episodes of emesis in the last 24 hours - denies diarrhea. Denies dizziness, lightheadedness, and CP. Reports SOB and "exhaustion" from tremors. Alert and oriented at this time. Pt reports daily alcohol use and has been "tapering off" to 3 drinks per day for the last month. Reports last drink was yesterday. Visible tremors at this time.

## 2022-11-15 ENCOUNTER — Emergency Department
Admission: EM | Admit: 2022-11-15 | Discharge: 2022-11-15 | Disposition: A | Discharge status: Home / Self Care | Payer: BC Managed Care – PPO | Attending: Emergency Medicine | Admitting: Emergency Medicine

## 2022-11-15 DIAGNOSIS — F10930 Alcohol use, unspecified with withdrawal, uncomplicated: Secondary | ICD-10-CM

## 2022-11-15 MED ORDER — CHLORDIAZEPOXIDE HCL 25 MG PO CAPS
25.0000 mg | ORAL_CAPSULE | Freq: Once | ORAL | Status: AC
  Administered 2022-11-15: 25 mg via ORAL
  Filled 2022-11-15: qty 1

## 2022-11-15 MED ORDER — CHLORDIAZEPOXIDE HCL 25 MG PO CAPS
50.0000 mg | ORAL_CAPSULE | Freq: Once | ORAL | Status: AC
  Administered 2022-11-15: 50 mg via ORAL
  Filled 2022-11-15: qty 2

## 2022-11-15 MED ORDER — LORAZEPAM 2 MG PO TABS
2.0000 mg | ORAL_TABLET | Freq: Once | ORAL | Status: AC
  Administered 2022-11-15: 2 mg via ORAL
  Filled 2022-11-15: qty 1

## 2022-11-15 NOTE — ED Notes (Signed)
NAD noted at time of D/C. Pt states understanding of return precautions. Pt D/C into the care of his uncle at this time.

## 2022-11-15 NOTE — ED Provider Notes (Signed)
[facility] Provider Note    None    (approximate)   History   Tremors   HPI  Melvin Knight is a 41 y.o. male who presents to the ED for evaluation of Tremors   I reviewed medical DC summary from early November where patient was admitted for alcohol withdrawal seizures.  Patient presents to the ED for evaluation of increasing tremors in the setting of running out of his home alcohol.  He reports he has been cutting back some, from 5 drinks per day to now just 3 or sometimes 4 drinks per day.  Every day.  Has had no seizures since that admission a couple months ago.  Reports he ran out of his ethanol at home on Sunday and went about 24 hours without any drinks.  Throughout the day on Sunday, he had increasing tremors, nausea.  He has some home 0.5 mg lorazepam tablets provided by his PCP to help with withdrawals, but he was unable to keep them down.  Due to the increasing tremors, emesis, who presents to the ED for evaluation.  No hallucinations or encephalopathy.  His uncle is with him here and agrees that he seems okay and has not been encephalopathic.  Of note his uncle is a retired [employer] physician, and has been helping him through this withdrawal process.  Patient has no desire to quit drinking at this point.  No other recreational drugs beyond ethanol  Physical Exam   Triage Vital Signs: ED Triage Vitals  Enc Vitals Group     BP 11/14/22 2135 (!) 159/115     Pulse Rate 11/14/22 2134 (!) 120     Resp 11/14/22 2134 20     Temp 11/14/22 2138 98.4 F (36.9 C)     Temp Source 11/14/22 2134 Oral     SpO2 11/14/22 2134 96 %     Weight 11/14/22 2134 185 lb (83.9 kg)     Height 11/14/22 2134 5\' 7"  (1.702 m)     Head Circumference --      Peak Flow --      Pain Score 11/14/22 2135 0     Pain Loc --      Pain Edu? --      Excl. in GC? --     Most recent vital signs: Vitals:   11/15/22 0057 11/15/22 0240  BP: (!) 157/102 (!) 157/113  Pulse: (!) 101  95  Resp: 20 16  Temp: 100.2 F (37.9 C) 99 F (37.2 C)  SpO2: 95% 95%    General: Awake, no distress.  Looks well.  Alert and oriented.  Minimal tremulousness is noted. CV:  Good peripheral perfusion.  Resp:  Normal effort.  Abd:  No distention.  MSK:  No deformity noted.  Neuro:  No focal deficits appreciated. Cranial nerves II through XII intact 5/5 strength and sensation in all 4 extremities Other:     ED Results / Procedures / Treatments   Labs (all labs ordered are listed, but only abnormal results are displayed) Labs Reviewed  CBC - Abnormal; Notable for the following components:      Result Value   RBC 3.63 (*)    Hemoglobin 12.8 (*)    HCT 36.8 (*)    MCV 101.4 (*)    MCH 35.3 (*)    All other components within normal limits  COMPREHENSIVE METABOLIC PANEL - Abnormal; Notable for the following components:   Potassium 3.4 (*)    Glucose, Bld 162 (*)  Calcium 8.7 (*)    AST 89 (*)    ALT 54 (*)    Total Bilirubin 1.6 (*)    All other components within normal limits  ETHANOL  URINE DRUG SCREEN, QUALITATIVE ([facility] ONLY)    EKG   RADIOLOGY   Official radiology report(s): No results found.  PROCEDURES and INTERVENTIONS:  Procedures  Medications  LORazepam (ATIVAN) injection 1 mg (1 mg Intravenous Given 11/14/22 2201)  LORazepam (ATIVAN) tablet 2 mg (2 mg Oral Given 11/15/22 0056)  chlordiazePOXIDE (LIBRIUM) capsule 25 mg (25 mg Oral Given 11/15/22 0056)  chlordiazePOXIDE (LIBRIUM) capsule 50 mg (50 mg Oral Given 11/15/22 0409)     IMPRESSION / MDM / ASSESSMENT AND PLAN / ED COURSE  I reviewed the triage vital signs and the nursing notes.  Differential diagnosis includes, but is not limited to, alcohol withdrawals, DTs, polysubstance abuse  {Patient presents with symptoms of an acute illness or injury that is potentially life-threatening.  41 year old male who is a longtime drinker presents to the ED with stigmata of withdrawals in the setting  of running out of his home alcohol and unable to pick up anymore due to the holidays, suitable for outpatient management with the assistance of benzos until he can restock.  He has no desire to quit drinking at this point and I offered him rehab, and will print out outpatient rehab resources with his paperwork.  No suicidal ideations, signs of particular toxidromes or coingestions, DTs or more severe derangements.  Screening blood work with minimal hypokalemia.  Macrocytic anemia is noted.  Negative ethanol level serum.  We provide Librium before he leaves, I considered giving him a Librium taper but he has no desire to quit and he would not be able to pick up any prescriptions considering it is Christmas today anyways.  He has an of Ativan on hand to get him through the day, I think, until Tuesday.  He does not want to pick up any more benzos on Tuesday but prefers to pick up ethanol.  We discussed return precautions      FINAL CLINICAL IMPRESSION(S) / ED DIAGNOSES   Final diagnoses:  Alcohol withdrawal syndrome without complication (HCC)     Rx / DC Orders   ED Discharge Orders     None        Note:  This document was prepared using Dragon voice recognition software and may include unintentional dictation errors.   Delton Prairie, MD 11/15/22 [phone]

## 2022-11-15 NOTE — Discharge Instructions (Addendum)
Use the Ativan tablets to help you get through Christmas until you can restock.  I would take 2 at a time.   If you ever become interested in rehab resources, detox or coming off of alcohol altogether, please return to the ED.

## 2022-12-05 ENCOUNTER — Telehealth: Payer: Self-pay | Admitting: Internal Medicine

## 2022-12-05 DIAGNOSIS — F419 Anxiety disorder, unspecified: Secondary | ICD-10-CM

## 2022-12-06 ENCOUNTER — Other Ambulatory Visit: Payer: Self-pay | Admitting: Internal Medicine

## 2022-12-06 DIAGNOSIS — R000 Tachycardia, unspecified: Secondary | ICD-10-CM

## 2022-12-06 DIAGNOSIS — F419 Anxiety disorder, unspecified: Secondary | ICD-10-CM

## 2022-12-06 DIAGNOSIS — R251 Tremor, unspecified: Secondary | ICD-10-CM

## 2022-12-06 NOTE — Telephone Encounter (Signed)
Requested Prescriptions  Pending Prescriptions Disp Refills   metoprolol succinate (TOPROL-XL) 25 MG 24 hr tablet [Pharmacy Med Name: METOPROLOL SUCC ER 25 MG TAB] 30 tablet 1    Sig: TAKE 1/2 TABLET BY MOUTH EVERY DAY     Cardiovascular:  Beta Blockers Failed - 12/06/2022  1:25 AM      Failed - Last BP in normal range    BP Readings from Last 1 Encounters:  11/15/22 (!) 170/125         Passed - Last Heart Rate in normal range    Pulse Readings from Last 1 Encounters:  11/15/22 (!) 101         Passed - Valid encounter within last 6 months    Recent Outpatient Visits           1 month ago Alcohol use disorder, moderate, dependence ([place])   [facility], Grayland Ormond, DO   1 month ago Return to work evaluation   [facility] [name], DO   2 months ago Hospital discharge follow-up   [facility] [name], DO   3 months ago [place], DO   4 months ago [facility] [name], DO       Future Appointments             In 1 week [name], [facility], [facility]

## 2022-12-06 NOTE — Telephone Encounter (Signed)
Dc'd 10/05/22 Dose change Dr. Rosana Berger  Requested Prescriptions  Refused Prescriptions Disp Refills   escitalopram (LEXAPRO) 10 MG tablet [Pharmacy Med Name: ESCITALOPRAM 10 MG TABLET] 90 tablet 0    Sig: TAKE 1 TABLET BY MOUTH EVERY DAY     Psychiatry:  Antidepressants - SSRI Passed - 12/05/2022  8:30 AM      Passed - Completed PHQ-2 or PHQ-9 in the last 360 days      Passed - Valid encounter within last 6 months    Recent Outpatient Visits           1 month ago Alcohol use disorder, moderate, dependence ([name])   [place], DO   1 month ago Return to work evaluation   [facility] [name], DO   2 months ago Hospital discharge follow-up   [facility] [name], DO   3 months ago [place], DO   4 months ago [facility] [name], DO       Future Appointments             In 1 week [name], [name], [facility]

## 2022-12-12 NOTE — Progress Notes (Signed)
Established Patient Office Visit  Subjective:  Patient ID: Melvin Knight, Melvin Knight    DOB: [birth year]  Age: 42 y.o. MRN: [id number]  CC:  Chief Complaint  Patient presents with   Follow-up    HPI Melvin Knight presents for follow up. Patient has been trying to cut back on drinking but tremors are worsening, along with neurologic symptoms. Patient was seen in the ED 11/15/22 for acute alcohol withdrawal, given IV Ativan but not Librium taper as patient stated he had no desire to quit drinking. Was seen by Neurology on 11/12/22, symptoms of tremors attributed to alcohol disorder.   Hypertension: -Medications: Benazepril-HCTZ 20-12.5 mg, Metoprolol 25 mg daily  -Checking BP at home (average): Average 130-150/80-90 -Denies any SOB, CP, vision changes, LE edema or symptoms of hypotension.   Elevated LFTs/Alcohol Use Disorder: -CMP 11/14/22 with AST 89, ALT 54 -Still drinking but "cutting back" uncertain exactly how many ounces of alcohol a day he is consuming  -Recently admitted for alcohol withdrawal seizures in 10/05/22 and seen in ED for same 11/15/22  Weakness/Tremor: -Intermittent for multiple months now, exacerbated again today -Weakness episodes involve symmetrical weakness in both lower and upper extremities.  -He is currently on a multivitamin and B complex vitamin, as well as folate and thiamine   Stress/MDD: -Mood status: Worse -Current treatment: Lexapro 20 mg daily, hydroxyzine 10 mg as needed.   Past Medical History:  Diagnosis Date   AKI (acute kidney injury) ([place]) 09/24/2022   Anxiety    Hypertension    Hypokalemia    Hypokalemia 09/24/2022   Hypomagnesemia 09/24/2022    History reviewed. No pertinent surgical history.  Family History  Problem Relation Age of Onset   Hypertension Father    Stroke Father    Cancer Sister     Social History   Socioeconomic History   Marital status: Single    Spouse name: Not on file   Number of children: Not on file    Years of education: Not on file   Highest education level: Not on file  Occupational History   Not on file  Tobacco Use   Smoking status: Every Day    Packs/day: 1.00    Types: Cigarettes   Smokeless tobacco: Never  Vaping Use   Vaping Use: Never used  Substance and Sexual Activity   Alcohol use: Yes    Alcohol/week: 6.0 standard drinks of alcohol    Types: 6 Shots of liquor per week    Comment: 6 cocktails per day   Drug use: Not Currently   Sexual activity: Not Currently  Other Topics Concern   Not on file  Social History Narrative   Right handed    Social Determinants of Health   Financial Resource Strain: Not on file  Food Insecurity: No Food Insecurity (09/24/2022)   Hunger Vital Sign    Worried About Running Out of Food in the Last Year: Never true    Ran Out of Food in the Last Year: Never true  Transportation Needs: No Transportation Needs (09/24/2022)   PRAPARE - Hydrologist (Medical): No    Lack of Transportation (Non-Medical): No  Physical Activity: Not on file  Stress: Not on file  Social Connections: Socially Isolated (01/01/2022)   Social Connection and Isolation Panel [NHANES]    Frequency of Communication with Friends and Family: Three times a week    Frequency of Social Gatherings with Friends and Family: More than three times a week  Attends Religious Services: Never    Active Member of Clubs or Organizations: No    Attends Archivist Meetings: Never    Marital Status: Never married  Intimate Partner Violence: Not At Risk (09/24/2022)   Humiliation, Afraid, Rape, and Kick questionnaire    Fear of Current or Ex-Partner: No    Emotionally Abused: No    Physically Abused: No    Sexually Abused: No    Outpatient Medications Prior to Visit  Medication Sig Dispense Refill   folic acid (FOLVITE) 1 MG tablet Take 1 tablet (1 mg total) by mouth daily. 30 tablet 2   hydrocortisone 1 % ointment Apply 1 Application  topically 2 (two) times daily. 30 g 0   hydrOXYzine (ATARAX) 10 MG tablet TAKE 1 TABLET (10 MG TOTAL) BY MOUTH AS NEEDED FOR ANXIETY. 90 tablet 0   LORazepam (ATIVAN) 0.5 MG tablet Take 1 tablet (0.5 mg total) by mouth as needed for seizure. 10 tablet 0   Multiple Vitamins-Minerals (MULTIVITAMIN WITH MINERALS) tablet Take 1 tablet by mouth daily. 90 tablet 3   thiamine (VITAMIN B-1) 100 MG tablet Take 1 tablet (100 mg total) by mouth daily. 30 tablet 2   vitamin B-12 (CYANOCOBALAMIN) 100 MCG tablet Take 100 mcg by mouth daily.     benazepril-hydrochlorthiazide (LOTENSIN HCT) 20-12.5 MG tablet Take 1 tablet by mouth daily. 180 tablet 3   escitalopram (LEXAPRO) 20 MG tablet Take 1 tablet (20 mg total) by mouth daily. 90 tablet 0   metoprolol succinate (TOPROL-XL) 25 MG 24 hr tablet TAKE 1/2 TABLET BY MOUTH EVERY DAY 30 tablet 1   No facility-administered medications prior to visit.    No Known Allergies  ROS Review of Systems  Constitutional:  Negative for chills and fever.  Eyes:  Negative for visual disturbance.  Respiratory:  Negative for cough and shortness of breath.   Cardiovascular:  Negative for chest pain and palpitations.  Neurological:  Positive for tremors and weakness. Negative for dizziness, light-headedness, numbness and headaches.  Psychiatric/Behavioral:  The patient is nervous/anxious.       Objective:    Physical Exam Constitutional:      Comments: Disheveled appearance   HENT:     Head: Normocephalic and atraumatic.  Eyes:     Comments: Injected conjunctiva bilaterally   Cardiovascular:     Rate and Rhythm: Regular rhythm. Tachycardia present.  Pulmonary:     Effort: Pulmonary effort is normal.     Breath sounds: Normal breath sounds.  Skin:    General: Skin is warm and dry.  Neurological:     Mental Status: He is alert. Mental status is at baseline.     Motor: Weakness present.     Gait: Gait abnormal.     Comments: Bilateral upper extremity tremors,  worse  Psychiatric:        Mood and Affect: Mood is anxious.     BP 124/84   Pulse (!) 124   Temp 98.3 F (36.8 C)   Resp 18   Ht 5\' 7"  (1.702 m)   Wt 189 lb 12.8 oz (86.1 kg)   SpO2 98%   BMI 29.73 kg/m  Wt Readings from Last 3 Encounters:  12/13/22 189 lb 12.8 oz (86.1 kg)  11/14/22 185 lb (83.9 kg)  11/05/22 187 lb (84.8 kg)     Health Maintenance Due  Topic Date Due   COVID-19 Vaccine (2 - 2023-24 season) 07/23/2022       There are no preventive  care reminders to display for this patient.  Lab Results  Component Value Date   TSH 1.15 01/22/2022   Lab Results  Component Value Date   WBC 5.2 11/14/2022   HGB 12.8 (L) 11/14/2022   HCT 36.8 (L) 11/14/2022   MCV 101.4 (H) 11/14/2022   PLT 156 11/14/2022   Lab Results  Component Value Date   NA 141 11/14/2022   K 3.4 (L) 11/14/2022   CO2 24 11/14/2022   GLUCOSE 162 (H) 11/14/2022   BUN 14 11/14/2022   CREATININE 0.86 11/14/2022   BILITOT 1.6 (H) 11/14/2022   ALKPHOS 78 11/14/2022   AST 89 (H) 11/14/2022   ALT 54 (H) 11/14/2022   PROT 7.5 11/14/2022   ALBUMIN 4.2 11/14/2022   CALCIUM 8.7 (L) 11/14/2022   ANIONGAP 12 11/14/2022   EGFR 15 (L) 11/05/2022   Lab Results  Component Value Date   CHOL 258 (H) 01/11/2022   Lab Results  Component Value Date   HDL 61 01/11/2022   Lab Results  Component Value Date   LDLCALC 166 (H) 01/11/2022   Lab Results  Component Value Date   TRIG 162 (H) 01/11/2022   Lab Results  Component Value Date   CHOLHDL 4.2 01/11/2022   Lab Results  Component Value Date   HGBA1C 5.7 (H) 02/08/2022      Assessment & Plan:   1. Alcohol use disorder, moderate, dependence (HCC)/Tremor: Patient at risk for acute withdrawal. Unfortunately his referral to Psychiatry was denied. [facility] called for patient today, they are taking walk-in's from now until 2 pm today and he will head there are soon as he leaves the clinic today. Again discussed the need for inpatient rehab  services. Labs worsening, liver function enzymes increasing so Naltrexone is not an option. Discussed how his neurologic symptoms are because of his drinking. Patient voices understanding and knows something needs to change. Follow up here in 1 month to check his progress.   2. Primary hypertension/Tachycardia: BP controlled here today despite tremors. Continue current treatment, refills today.   - benazepril-hydrochlorthiazide (LOTENSIN HCT) 20-12.5 MG tablet; Take 1 tablet by mouth daily.  Dispense: 90 tablet; Refill: 1 - metoprolol succinate (TOPROL-XL) 25 MG 24 hr tablet; Take 1 tablet (25 mg total) by mouth daily.  Dispense: 90 tablet; Refill: 1  3. Anxiety: Exacerbated, Lexapro 20 mg refilled.   - escitalopram (LEXAPRO) 20 MG tablet; Take 1 tablet (20 mg total) by mouth daily.  Dispense: 90 tablet; Refill: 1   Follow-up: Return in about 4 weeks (around 01/10/2023).    Margarita Mail, DO

## 2022-12-13 ENCOUNTER — Ambulatory Visit: Payer: BC Managed Care – PPO | Admitting: Internal Medicine

## 2022-12-13 ENCOUNTER — Encounter: Payer: Self-pay | Admitting: Internal Medicine

## 2022-12-13 VITALS — BP 124/84 | HR 124 | Temp 98.3°F | Resp 18 | Ht 67.0 in | Wt 189.8 lb

## 2022-12-13 DIAGNOSIS — F419 Anxiety disorder, unspecified: Secondary | ICD-10-CM | POA: Diagnosis not present

## 2022-12-13 DIAGNOSIS — R000 Tachycardia, unspecified: Secondary | ICD-10-CM | POA: Diagnosis not present

## 2022-12-13 DIAGNOSIS — R251 Tremor, unspecified: Secondary | ICD-10-CM

## 2022-12-13 DIAGNOSIS — F1020 Alcohol dependence, uncomplicated: Secondary | ICD-10-CM

## 2022-12-13 DIAGNOSIS — I10 Essential (primary) hypertension: Secondary | ICD-10-CM | POA: Diagnosis not present

## 2022-12-13 MED ORDER — BENAZEPRIL-HYDROCHLOROTHIAZIDE 20-12.5 MG PO TABS: 1.0000 | ORAL_TABLET | Freq: Every day | ORAL | 1 refills | Status: DC

## 2022-12-13 MED ORDER — METOPROLOL SUCCINATE ER 25 MG PO TB24: 25.0000 mg | ORAL_TABLET | Freq: Every day | ORAL | 1 refills | Status: DC

## 2022-12-13 MED ORDER — ESCITALOPRAM OXALATE 20 MG PO TABS: 20.0000 mg | ORAL_TABLET | Freq: Every day | ORAL | 1 refills | Status: DC

## 2022-12-13 NOTE — Patient Instructions (Addendum)
It was great seeing you today!  Plan discussed at today's visit: -Medications refilled -Please go to [facility] clinic, address in [place] [address] [place], phone number is [phone]. They are taking walk ins from 8-2 today so you do not need an appointment  Follow up in: 1 month or sooner as needed  Take care and let us know if you have any questions or concerns prior to your next visit.  Dr. Rosana Berger

## 2022-12-20 ENCOUNTER — Encounter: Payer: Self-pay | Admitting: Internal Medicine

## 2022-12-23 ENCOUNTER — Telehealth: Payer: Self-pay

## 2022-12-23 NOTE — Telephone Encounter (Signed)
Transition Care Management Unsuccessful Follow-up Telephone Call  Date of discharge and from where:  [facility] 12/22/2022  Attempts:  1st Attempt  Reason for unsuccessful TCM follow-up call:  Voice mail full Juanda Crumble, [place] Direct Dial [phone]

## 2022-12-24 NOTE — Telephone Encounter (Signed)
Transition Care Management Unsuccessful Follow-up Telephone Call  Date of discharge and from where:  [facility] 12/[facility]22  Attempts:  2nd Attempt  Reason for unsuccessful TCM follow-up call:  Left voice message Juanda Crumble, [place] Direct Dial [phone]

## 2022-12-27 NOTE — Telephone Encounter (Signed)
Transition Care Management Unsuccessful Follow-up Telephone Call  Date of discharge and from where:  [facility] 12/[facility]  Attempts:  3rd Attempt  Reason for unsuccessful TCM follow-up call:  Left voice message Juanda Crumble, [place] Direct Dial [phone]

## 2023-01-11 ENCOUNTER — Ambulatory Visit: Payer: BC Managed Care – PPO | Admitting: Internal Medicine

## 2023-01-27 ENCOUNTER — Telehealth: Payer: Self-pay | Admitting: Internal Medicine

## 2023-01-27 NOTE — Telephone Encounter (Signed)
Pt is scheduled     Copied from [facility] [id number]. Topic: Appointment Scheduling - Scheduling Inquiry for Clinic >> Jan 27, 2023  1:15 PM Sabas Sous wrote: Reason for CRM: Pt needs to schedule an appt following up from Rehab, he is released next Thursday. And wants to be worked in in the morning. I see [facility] available but I dont see [facility] appts until afternoon.   Email: [email or web address] (His counselor) pt says he is unable to take calls because is still in the facility.  Says he will be able to see his appt on MyChart once he is released next wendesday. Please advise  He wants to be seen next Friday at 8 am.

## 2023-02-03 NOTE — Progress Notes (Signed)
Established Patient Office Visit  Subjective:  Patient ID: Melvin Knight, male    DOB: 01-Jul-1981  Age: 42 y.o. MRN: [id number]  CC:  Chief Complaint  Patient presents with   Follow-up    HPI Melvin Knight presents for follow up. Patient was in-patient from 1/29 and discharged to [facility] for 6 weeks. He was discharged 2 weeks ago, obtaining records.  Hypertension: -Medications: Benazepril-HCTZ 20-12.5 mg, Metoprolol 25 mg daily - per patient once he stopped drinking he was becoming light headed in rehab, so all of his blood pressure medications were discontinued. He hasn't been on anything in about 2 weeks. -Denies any SOB, CP, vision changes, LE edema or symptoms of hypotension.   Elevated LFTs/Alcohol Use Disorder: -Liver function 12/22/22 AST 96, ALT 73 -Sober for 6 weeks now, just completed in-patient rehab program  -He is currently on a multivitamin and B complex vitamin, as well as folate and thiamine -Tremors have improved    Stress/MDD: -Mood status: Stable -Current treatment: Lexapro 20 mg daily  Past Medical History:  Diagnosis Date   AKI (acute kidney injury) ([place]) 09/24/2022   Anxiety    Hypertension    Hypokalemia    Hypokalemia 09/24/2022   Hypomagnesemia 09/24/2022    No past surgical history on file.  Family History  Problem Relation Age of Onset   Hypertension Father    Stroke Father    Cancer Sister     Social History   Socioeconomic History   Marital status: Single    Spouse name: Not on file   Number of children: Not on file   Years of education: Not on file   Highest education level: Not on file  Occupational History   Not on file  Tobacco Use   Smoking status: Every Day    Packs/day: 1    Types: Cigarettes   Smokeless tobacco: Never  Vaping Use   Vaping Use: Never used  Substance and Sexual Activity   Alcohol use: Yes    Alcohol/week: 6.0 standard drinks of alcohol    Types: 6 Shots of liquor per week     Comment: 6 cocktails per day   Drug use: Not Currently   Sexual activity: Not Currently  Other Topics Concern   Not on file  Social History Narrative   Right handed    Social Determinants of Health   Financial Resource Strain: Not on file  Food Insecurity: No Food Insecurity (09/24/2022)   Hunger Vital Sign    Worried About Running Out of Food in the Last Year: Never true    Ran Out of Food in the Last Year: Never true  Transportation Needs: No Transportation Needs (09/24/2022)   PRAPARE - Hydrologist (Medical): No    Lack of Transportation (Non-Medical): No  Physical Activity: Not on file  Stress: Not on file  Social Connections: Socially Isolated (01/01/2022)   Social Connection and Isolation Panel [NHANES]    Frequency of Communication with Friends and Family: Three times a week    Frequency of Social Gatherings with Friends and Family: More than three times a week    Attends Religious Services: Never    Marine scientist or Organizations: No    Attends Archivist Meetings: Never    Marital Status: Never married  Intimate Partner Violence: Not At Risk (09/24/2022)   Humiliation, Afraid, Rape, and Kick questionnaire    Fear of Current or Ex-Partner: No  Emotionally Abused: No    Physically Abused: No    Sexually Abused: No    Outpatient Medications Prior to Visit  Medication Sig Dispense Refill   benazepril-hydrochlorthiazide (LOTENSIN HCT) 20-12.5 MG tablet Take 1 tablet by mouth daily. 90 tablet 1   escitalopram (LEXAPRO) 20 MG tablet Take 1 tablet (20 mg total) by mouth daily. 90 tablet 1   folic acid (FOLVITE) 1 MG tablet Take 1 tablet (1 mg total) by mouth daily. 30 tablet 2   hydrocortisone 1 % ointment Apply 1 Application topically 2 (two) times daily. 30 g 0   LORazepam (ATIVAN) 0.5 MG tablet Take 1 tablet (0.5 mg total) by mouth as needed for seizure. 10 tablet 0   metoprolol succinate (TOPROL-XL) 25 MG 24 hr tablet Take  1 tablet (25 mg total) by mouth daily. 90 tablet 1   Multiple Vitamins-Minerals (MULTIVITAMIN WITH MINERALS) tablet Take 1 tablet by mouth daily. 90 tablet 3   thiamine (VITAMIN B-1) 100 MG tablet Take 1 tablet (100 mg total) by mouth daily. 30 tablet 2   vitamin B-12 (CYANOCOBALAMIN) 100 MCG tablet Take 100 mcg by mouth daily.     hydrOXYzine (ATARAX) 10 MG tablet TAKE 1 TABLET (10 MG TOTAL) BY MOUTH AS NEEDED FOR ANXIETY. 90 tablet 0   No facility-administered medications prior to visit.    No Known Allergies  ROS Review of Systems  Constitutional:  Negative for chills and fever.  Eyes:  Negative for visual disturbance.  Respiratory:  Negative for cough and shortness of breath.   Cardiovascular:  Negative for chest pain and palpitations.  Neurological:  Negative for dizziness, tremors, weakness, light-headedness, numbness and headaches.  Psychiatric/Behavioral:  The patient is not nervous/anxious.       Objective:    Physical Exam Constitutional:      Appearance: Normal appearance.  HENT:     Head: Normocephalic and atraumatic.  Eyes:     Conjunctiva/sclera: Conjunctivae normal.  Cardiovascular:     Rate and Rhythm: Normal rate and regular rhythm.  Pulmonary:     Effort: Pulmonary effort is normal.     Breath sounds: Normal breath sounds.  Skin:    General: Skin is warm and dry.  Neurological:     General: No focal deficit present.     Mental Status: He is alert. Mental status is at baseline.  Psychiatric:        Mood and Affect: Mood and affect normal.        Behavior: Behavior normal.     BP 116/66   Pulse 100   Temp 98.1 F (36.7 C)   Resp 18   Ht 5\' 7"  (1.702 m)   Wt 195 lb 12.8 oz (88.8 kg)   SpO2 98%   BMI 30.67 kg/m  Wt Readings from Last 3 Encounters:  02/04/23 195 lb 12.8 oz (88.8 kg)  12/13/22 189 lb 12.8 oz (86.1 kg)  11/14/22 185 lb (83.9 kg)     There are no preventive care reminders to display for this patient.      There are no  preventive care reminders to display for this patient.  Lab Results  Component Value Date   TSH 1.15 01/22/2022   Lab Results  Component Value Date   WBC 5.2 11/14/2022   HGB 12.8 (L) 11/14/2022   HCT 36.8 (L) 11/14/2022   MCV 101.4 (H) 11/14/2022   PLT 156 11/14/2022   Lab Results  Component Value Date   NA 141 11/14/2022  K 3.4 (L) 11/14/2022   CO2 24 11/14/2022   GLUCOSE 162 (H) 11/14/2022   BUN 14 11/14/2022   CREATININE 0.86 11/14/2022   BILITOT 1.6 (H) 11/14/2022   ALKPHOS 78 11/14/2022   AST 89 (H) 11/14/2022   ALT 54 (H) 11/14/2022   PROT 7.5 11/14/2022   ALBUMIN 4.2 11/14/2022   CALCIUM 8.7 (L) 11/14/2022   ANIONGAP 12 11/14/2022   EGFR 15 (L) 11/05/2022   Lab Results  Component Value Date   CHOL 258 (H) 01/11/2022   Lab Results  Component Value Date   HDL 61 01/11/2022   Lab Results  Component Value Date   LDLCALC 166 (H) 01/11/2022   Lab Results  Component Value Date   TRIG 162 (H) 01/11/2022   Lab Results  Component Value Date   CHOLHDL 4.2 01/11/2022   Lab Results  Component Value Date   HGBA1C 5.7 (H) 02/08/2022      Assessment & Plan:   1. Alcohol use disorder, moderate, dependence ([place]): Last drink was 6 weeks ago, discharged from inpatient rehab 2 days ago and doing well so far. Obtaining records from rehab to review. Follow up here in 1 month.  2. Primary hypertension: Blood pressure controlled, off all blood pressure medications. Continue to monitor.  3. Anxiety: Stable but continue Lexapro 20 mg for now, can discuss titrating down in the future.   Follow-up: Return in about 4 weeks (around 03/04/2023).    [name], DO

## 2023-02-04 ENCOUNTER — Ambulatory Visit: Payer: BC Managed Care – PPO | Admitting: Internal Medicine

## 2023-02-04 ENCOUNTER — Encounter: Payer: Self-pay | Admitting: Internal Medicine

## 2023-02-04 VITALS — BP 116/66 | HR 100 | Temp 98.1°F | Resp 18 | Ht 67.0 in | Wt 195.8 lb

## 2023-02-04 DIAGNOSIS — F1020 Alcohol dependence, uncomplicated: Secondary | ICD-10-CM | POA: Diagnosis not present

## 2023-02-04 DIAGNOSIS — F419 Anxiety disorder, unspecified: Secondary | ICD-10-CM | POA: Diagnosis not present

## 2023-02-04 DIAGNOSIS — I10 Essential (primary) hypertension: Secondary | ICD-10-CM | POA: Diagnosis not present

## 2023-03-15 NOTE — Progress Notes (Unsigned)
Established Patient Office Visit  Subjective:  Patient ID: Melvin Knight, male    DOB: February 21, 1981  Age: 42 y.o. MRN: [id number]  CC:  No chief complaint on file.   HPI Melvin Knight presents for follow up.   Hypertension: -Medications: Nothing currently, had been on Benazepril-HCTZ 20-12.5 mg, Metoprolol 25 mg daily - per patient once he stopped drinking he was becoming light headed in rehab, so all of his blood pressure medications were discontinued.  -Denies any SOB, CP, vision changes, LE edema or symptoms of hypotension.   Elevated LFTs/Alcohol Use Disorder: -Liver function 12/22/22 AST 96, ALT 73 -Sober for 6 weeks now, just completed in-patient rehab program  -He is currently on a multivitamin and B complex vitamin, as well as folate and thiamine -Tremors have improved    Stress/MDD: -Mood status: Stable -Current treatment: Lexapro 20 mg daily  Past Medical History:  Diagnosis Date   AKI (acute kidney injury) (HCC) 09/24/2022   Anxiety    Hypertension    Hypokalemia    Hypokalemia 09/24/2022   Hypomagnesemia 09/24/2022    No past surgical history on file.  Family History  Problem Relation Age of Onset   Hypertension Father    Stroke Father    Cancer Sister     Social History   Socioeconomic History   Marital status: Single    Spouse name: Not on file   Number of children: Not on file   Years of education: Not on file   Highest education level: Not on file  Occupational History   Not on file  Tobacco Use   Smoking status: Every Day    Packs/day: 1    Types: Cigarettes   Smokeless tobacco: Never  Vaping Use   Vaping Use: Never used  Substance and Sexual Activity   Alcohol use: Yes    Alcohol/week: 6.0 standard drinks of alcohol    Types: 6 Shots of liquor per week    Comment: 6 cocktails per day   Drug use: Not Currently   Sexual activity: Not Currently  Other Topics Concern   Not on file  Social History Narrative   Right handed    Social  Determinants of Health   Financial Resource Strain: Not on file  Food Insecurity: No Food Insecurity (09/24/2022)   Hunger Vital Sign    Worried About Running Out of Food in the Last Year: Never true    Ran Out of Food in the Last Year: Never true  Transportation Needs: No Transportation Needs (09/24/2022)   PRAPARE - Administrator, Civil Service (Medical): No    Lack of Transportation (Non-Medical): No  Physical Activity: Not on file  Stress: Not on file  Social Connections: Socially Isolated (01/01/2022)   Social Connection and Isolation Panel [NHANES]    Frequency of Communication with Friends and Family: Three times a week    Frequency of Social Gatherings with Friends and Family: More than three times a week    Attends Religious Services: Never    Database administrator or Organizations: No    Attends Banker Meetings: Never    Marital Status: Never married  Intimate Partner Violence: Not At Risk (09/24/2022)   Humiliation, Afraid, Rape, and Kick questionnaire    Fear of Current or Ex-Partner: No    Emotionally Abused: No    Physically Abused: No    Sexually Abused: No    Outpatient Medications Prior to Visit  Medication Sig Dispense Refill  benazepril-hydrochlorthiazide (LOTENSIN HCT) 20-12.5 MG tablet Take 1 tablet by mouth daily. 90 tablet 1   escitalopram (LEXAPRO) 20 MG tablet Take 1 tablet (20 mg total) by mouth daily. 90 tablet 1   folic acid (FOLVITE) 1 MG tablet Take 1 tablet (1 mg total) by mouth daily. 30 tablet 2   hydrocortisone 1 % ointment Apply 1 Application topically 2 (two) times daily. 30 g 0   metoprolol succinate (TOPROL-XL) 25 MG 24 hr tablet Take 1 tablet (25 mg total) by mouth daily. 90 tablet 1   Multiple Vitamins-Minerals (MULTIVITAMIN WITH MINERALS) tablet Take 1 tablet by mouth daily. 90 tablet 3   thiamine (VITAMIN B-1) 100 MG tablet Take 1 tablet (100 mg total) by mouth daily. 30 tablet 2   vitamin B-12 (CYANOCOBALAMIN)  100 MCG tablet Take 100 mcg by mouth daily.     No facility-administered medications prior to visit.    No Known Allergies  ROS Review of Systems  Constitutional:  Negative for chills and fever.  Eyes:  Negative for visual disturbance.  Respiratory:  Negative for cough and shortness of breath.   Cardiovascular:  Negative for chest pain and palpitations.  Neurological:  Negative for dizziness, tremors, weakness, light-headedness, numbness and headaches.  Psychiatric/Behavioral:  The patient is not nervous/anxious.       Objective:    Physical Exam Constitutional:      Appearance: Normal appearance.  HENT:     Head: Normocephalic and atraumatic.  Eyes:     Conjunctiva/sclera: Conjunctivae normal.  Cardiovascular:     Rate and Rhythm: Normal rate and regular rhythm.  Pulmonary:     Effort: Pulmonary effort is normal.     Breath sounds: Normal breath sounds.  Skin:    General: Skin is warm and dry.  Neurological:     General: No focal deficit present.     Mental Status: He is alert. Mental status is at baseline.  Psychiatric:        Mood and Affect: Mood and affect normal.        Behavior: Behavior normal.     There were no vitals taken for this visit. Wt Readings from Last 3 Encounters:  02/04/23 195 lb 12.8 oz (88.8 kg)  12/13/22 189 lb 12.8 oz (86.1 kg)  11/14/22 185 lb (83.9 kg)     Health Maintenance Due  Topic Date Due   COVID-19 Vaccine (2 - 2023-24 season) 07/23/2022        There are no preventive care reminders to display for this patient.  Lab Results  Component Value Date   TSH 1.15 01/22/2022   Lab Results  Component Value Date   WBC 5.2 11/14/2022   HGB 12.8 (L) 11/14/2022   HCT 36.8 (L) 11/14/2022   MCV 101.4 (H) 11/14/2022   PLT 156 11/14/2022   Lab Results  Component Value Date   NA 141 11/14/2022   K 3.4 (L) 11/14/2022   CO2 24 11/14/2022   GLUCOSE 162 (H) 11/14/2022   BUN 14 11/14/2022   CREATININE 0.86 11/14/2022    BILITOT 1.6 (H) 11/14/2022   ALKPHOS 78 11/14/2022   AST 89 (H) 11/14/2022   ALT 54 (H) 11/14/2022   PROT 7.5 11/14/2022   ALBUMIN 4.2 11/14/2022   CALCIUM 8.7 (L) 11/14/2022   ANIONGAP 12 11/14/2022   EGFR 15 (L) 11/05/2022   Lab Results  Component Value Date   CHOL 258 (H) 01/11/2022   Lab Results  Component Value Date   HDL 61  01/11/2022   Lab Results  Component Value Date   LDLCALC 166 (H) 01/11/2022   Lab Results  Component Value Date   TRIG 162 (H) 01/11/2022   Lab Results  Component Value Date   CHOLHDL 4.2 01/11/2022   Lab Results  Component Value Date   HGBA1C 5.7 (H) 02/08/2022      Assessment & Plan:   1. Alcohol use disorder, moderate, dependence (HCC): Last drink was 6 weeks ago, discharged from inpatient rehab 2 days ago and doing well so far. Obtaining records from rehab to review. Follow up here in 1 month.  2. Primary hypertension: Blood pressure controlled, off all blood pressure medications. Continue to monitor.  3. Anxiety: Stable but continue Lexapro 20 mg for now, can discuss titrating down in the future.   Follow-up: No follow-ups on file.    Margarita Mail, DO

## 2023-03-17 ENCOUNTER — Ambulatory Visit: Payer: BC Managed Care – PPO | Admitting: Internal Medicine

## 2023-03-17 ENCOUNTER — Encounter: Payer: Self-pay | Admitting: Internal Medicine

## 2023-03-17 ENCOUNTER — Other Ambulatory Visit: Payer: Self-pay

## 2023-03-17 VITALS — BP 130/82 | HR 100 | Temp 98.2°F | Resp 18 | Ht 67.0 in | Wt 196.8 lb

## 2023-03-17 DIAGNOSIS — F419 Anxiety disorder, unspecified: Secondary | ICD-10-CM

## 2023-03-17 DIAGNOSIS — F1020 Alcohol dependence, uncomplicated: Secondary | ICD-10-CM | POA: Diagnosis not present

## 2023-03-17 DIAGNOSIS — I10 Essential (primary) hypertension: Secondary | ICD-10-CM

## 2023-03-17 MED ORDER — HYDROXYZINE HCL 10 MG PO TABS: 10.0000 mg | ORAL_TABLET | Freq: Three times a day (TID) | ORAL | 0 refills | Status: DC | PRN

## 2023-03-17 NOTE — Patient Instructions (Addendum)
It was great seeing you today!  Plan discussed at today's visit: -Discontinue all blood pressure medications -Discontinue Lexapro, can take Hydroxyzine as needed -Info about cubital tunnel below   Follow up in: 3 months   Take care and let us know if you have any questions or concerns prior to your next visit.  Dr. Caralee Ates  Cubital Tunnel Syndrome  Cubital tunnel syndrome is a condition that causes pain and weakness of the forearm and hand. It happens when one of the nerves that runs along the inside of the elbow joint (ulnar nerve) becomes irritated. This condition is usually caused by repeated arm motions that are done during sports or work-related activities. What are the causes? This condition may be caused by: Increased pressure on the ulnar nerve at the elbow, arm, or forearm. This can result from: Irritation caused by repeated elbow bending. Poorly healed elbow fractures. Tumors in the elbow. These are usually noncancerous (benign). Scar tissue that develops in the elbow after an injury. Bony growths (spurs) near the ulnar nerve. Stretching of the nerve due to loose elbow ligaments. Trauma to the nerve at the elbow. What increases the risk? The following factors may make you more likely to develop this condition: Doing manual labor that requires frequent bending of the elbow. Playing sports that include repeated or strenuous throwing motions, such as baseball. Playing contact sports, such as football or lacrosse. Not warming up properly before activities. Having diabetes. Having an underactive thyroid (hypothyroidism). What are the signs or symptoms? Symptoms of this condition include: Clumsiness or weakness of the hand. Tenderness of the inner elbow. Aching or soreness of the inner elbow, forearm, or fingers, especially the little finger or the ring finger. Increased pain when forcing the elbow to bend. Reduced control when throwing objects. Tingling, numbness, or a  burning feeling inside the forearm or in part of the hand or fingers, especially the little finger or the ring finger. Sharp pains that shoot from the elbow down to the wrist and hand. The inability to grip or pinch hard. How is this diagnosed? This condition is diagnosed based on: Your symptoms and medical history. Your health care provider will also ask for details about any injury. A physical exam. You may also have tests, including: Electromyogram (EMG). This test measures electrical signals sent by your nerves into the muscles. Nerve conduction study. This test measures how well electrical signals pass through your nerves. Imaging tests, such as X-rays, ultrasound, and MRI. These tests check for possible causes of your condition. How is this treated? This condition may be treated by: Stopping the activities that are causing your symptoms to get worse. Icing and taking medicines to reduce pain and swelling. Wearing a splint to prevent your elbow from bending, or wearing an elbow pad where the ulnar nerve is closest to the skin. Working with a physical therapist in less severe cases. This may help to: Decrease your symptoms. Improve the strength and range of motion of your elbow, forearm, and hand. If these treatments do not help, surgery may be needed. Follow these instructions at home: If you have a splint: Wear the splint as told by your health care provider. Remove it only as told by your health care provider. Loosen the splint if your fingers tingle, become numb, or turn cold and blue. Keep the splint clean. If the splint is not waterproof: Do not let it get wet. Cover it with a watertight covering when you take a bath or shower. Managing  pain, stiffness, and swelling  If directed, put ice on the injured area: Put ice in a plastic bag. Place a towel between your skin and the bag. Leave the ice on for 20 minutes, 2-3 times a day. Move your fingers often to avoid stiffness and  to lessen swelling. Raise (elevate) the injured area above the level of your heart while you are sitting or lying down. General instructions Take over-the-counter and prescription medicines only as told by your health care provider. Do any exercise or physical therapy as told by your health care provider. Do not drive or use heavy machinery while taking prescription pain medicine. If you were given an elbow pad, wear it as told by your health care provider. Keep all follow-up visits as told by your health care provider. This is important. Contact a health care provider if: Your symptoms get worse. Your symptoms do not get better with treatment. You have new pain. Your hand on the injured side feels numb or cold. Summary Cubital tunnel syndrome is a condition that causes pain and weakness of the forearm and hand. You are more likely to develop this condition if you do work or play sports that involve repeated arm movements. This condition is often treated by stopping repetitive activities, applying ice, and using anti-inflammatory medicines. In rare cases, surgery may be needed. This information is not intended to replace advice given to you by your health care provider. Make sure you discuss any questions you have with your health care provider. Document Revised: 12/30/2020 Document Reviewed: 12/31/2020 Elsevier Patient Education  2023 [employer].

## 2023-04-18 ENCOUNTER — Emergency Department: Payer: BC Managed Care – PPO

## 2023-04-18 ENCOUNTER — Inpatient Hospital Stay
Admission: EM | Admit: 2023-04-18 | Discharge: 2023-04-20 | DRG: 894 | Discharge status: Against Medical Advice | Payer: BC Managed Care – PPO | Attending: Internal Medicine | Admitting: Internal Medicine

## 2023-04-18 DIAGNOSIS — E876 Hypokalemia: Secondary | ICD-10-CM | POA: Diagnosis present

## 2023-04-18 DIAGNOSIS — Z823 Family history of stroke: Secondary | ICD-10-CM

## 2023-04-18 DIAGNOSIS — I160 Hypertensive urgency: Secondary | ICD-10-CM | POA: Diagnosis present

## 2023-04-18 DIAGNOSIS — Z5329 Procedure and treatment not carried out because of patient's decision for other reasons: Secondary | ICD-10-CM | POA: Diagnosis present

## 2023-04-18 DIAGNOSIS — Z87898 Personal history of other specified conditions: Secondary | ICD-10-CM

## 2023-04-18 DIAGNOSIS — F1020 Alcohol dependence, uncomplicated: Secondary | ICD-10-CM | POA: Diagnosis present

## 2023-04-18 DIAGNOSIS — I10 Essential (primary) hypertension: Secondary | ICD-10-CM | POA: Diagnosis present

## 2023-04-18 DIAGNOSIS — F32A Depression, unspecified: Secondary | ICD-10-CM | POA: Diagnosis present

## 2023-04-18 DIAGNOSIS — F419 Anxiety disorder, unspecified: Secondary | ICD-10-CM | POA: Diagnosis present

## 2023-04-18 DIAGNOSIS — F10231 Alcohol dependence with withdrawal delirium: Secondary | ICD-10-CM | POA: Diagnosis not present

## 2023-04-18 DIAGNOSIS — Z79899 Other long term (current) drug therapy: Secondary | ICD-10-CM

## 2023-04-18 DIAGNOSIS — F17210 Nicotine dependence, cigarettes, uncomplicated: Secondary | ICD-10-CM | POA: Diagnosis present

## 2023-04-18 DIAGNOSIS — Z8659 Personal history of other mental and behavioral disorders: Secondary | ICD-10-CM

## 2023-04-18 DIAGNOSIS — F10931 Alcohol use, unspecified with withdrawal delirium: Principal | ICD-10-CM

## 2023-04-18 DIAGNOSIS — Z8249 Family history of ischemic heart disease and other diseases of the circulatory system: Secondary | ICD-10-CM | POA: Diagnosis not present

## 2023-04-18 LAB — URINE DRUG SCREEN, QUALITATIVE (ARMC ONLY)
Amphetamines, Ur Screen: NOT DETECTED
Barbiturates, Ur Screen: NOT DETECTED
Benzodiazepine, Ur Scrn: NOT DETECTED
Cannabinoid 50 Ng, Ur ~~LOC~~: NOT DETECTED
Cocaine Metabolite,Ur ~~LOC~~: NOT DETECTED
MDMA (Ecstasy)Ur Screen: NOT DETECTED
Methadone Scn, Ur: NOT DETECTED
Opiate, Ur Screen: NOT DETECTED
Phencyclidine (PCP) Ur S: NOT DETECTED
Tricyclic, Ur Screen: NOT DETECTED

## 2023-04-18 LAB — CBC WITH DIFFERENTIAL/PLATELET
Abs Immature Granulocytes: 0.01 10*3/uL (ref 0.00–0.07)
Basophils Absolute: 0.1 10*3/uL (ref 0.0–0.1)
Basophils Relative: 1 %
Eosinophils Absolute: 0 10*3/uL (ref 0.0–0.5)
Eosinophils Relative: 0 %
HCT: 42.6 % (ref 39.0–52.0)
Hemoglobin: 15.3 g/dL (ref 13.0–17.0)
Immature Granulocytes: 0 %
Lymphocytes Relative: 15 %
Lymphs Abs: 0.7 10*3/uL (ref 0.7–4.0)
MCH: 34.2 pg — ABNORMAL HIGH (ref 26.0–34.0)
MCHC: 35.9 g/dL (ref 30.0–36.0)
MCV: 95.3 fL (ref 80.0–100.0)
Monocytes Absolute: 0.5 10*3/uL (ref 0.1–1.0)
Monocytes Relative: 11 %
Neutro Abs: 3.3 10*3/uL (ref 1.7–7.7)
Neutrophils Relative %: 73 %
Platelets: 129 10*3/uL — ABNORMAL LOW (ref 150–400)
RBC: 4.47 MIL/uL (ref 4.22–5.81)
RDW: 13.2 % (ref 11.5–15.5)
WBC: 4.5 10*3/uL (ref 4.0–10.5)
nRBC: 0 % (ref 0.0–0.2)

## 2023-04-18 LAB — COMPREHENSIVE METABOLIC PANEL
ALT: 61 U/L — ABNORMAL HIGH (ref 0–44)
AST: 98 U/L — ABNORMAL HIGH (ref 15–41)
Albumin: 4.9 g/dL (ref 3.5–5.0)
Alkaline Phosphatase: 92 U/L (ref 38–126)
Anion gap: 19 — ABNORMAL HIGH (ref 5–15)
BUN: 11 mg/dL (ref 6–20)
CO2: 20 mmol/L — ABNORMAL LOW (ref 22–32)
Calcium: 9.2 mg/dL (ref 8.9–10.3)
Chloride: 100 mmol/L (ref 98–111)
Creatinine, Ser: 0.78 mg/dL (ref 0.61–1.24)
GFR, Estimated: >60 mL/min (ref >60)
Glucose, Bld: 133 mg/dL — ABNORMAL HIGH (ref 70–99)
Potassium: 3.1 mmol/L — ABNORMAL LOW (ref 3.5–5.1)
Sodium: 139 mmol/L (ref 135–145)
Total Bilirubin: 1.5 mg/dL — ABNORMAL HIGH (ref 0.3–1.2)
Total Protein: 8.1 g/dL (ref 6.5–8.1)

## 2023-04-18 LAB — MAGNESIUM: Magnesium: 1.4 mg/dL — ABNORMAL LOW (ref 1.7–2.4)

## 2023-04-18 LAB — FOLATE: Folate: 8.4 ng/mL (ref >5.9)

## 2023-04-18 LAB — TSH: TSH: 1.562 u[IU]/mL (ref 0.350–4.500)

## 2023-04-18 LAB — ETHANOL: Alcohol, Ethyl (B): <10 mg/dL (ref <10)

## 2023-04-18 MED ORDER — LORAZEPAM 2 MG/ML IJ SOLN: 0.0000 mg | Freq: Two times a day (BID) | INTRAMUSCULAR | Status: DC

## 2023-04-18 MED ORDER — LORAZEPAM 2 MG PO TABS
0.0000 mg | ORAL_TABLET | Freq: Four times a day (QID) | ORAL | Status: DC
  Administered 2023-04-19: 1 mg via ORAL
  Filled 2023-04-18: qty 1

## 2023-04-18 MED ORDER — MAGNESIUM SULFATE 2 GM/50ML IV SOLN
2.0000 g | Freq: Once | INTRAVENOUS | Status: AC
  Administered 2023-04-18: 2 g via INTRAVENOUS
  Filled 2023-04-18: qty 50

## 2023-04-18 MED ORDER — ADULT MULTIVITAMIN W/MINERALS CH
1.0000 | ORAL_TABLET | Freq: Every day | ORAL | Status: DC
  Administered 2023-04-18 – 2023-04-20 (×3): 1 via ORAL
  Filled 2023-04-18 (×3): qty 1

## 2023-04-18 MED ORDER — ACETAMINOPHEN 650 MG RE SUPP: 650.0000 mg | Freq: Four times a day (QID) | RECTAL | Status: DC | PRN

## 2023-04-18 MED ORDER — ONDANSETRON HCL 4 MG PO TABS: 4.0000 mg | ORAL_TABLET | Freq: Four times a day (QID) | ORAL | Status: DC | PRN

## 2023-04-18 MED ORDER — VITAMIN B-12 100 MCG PO TABS
100.0000 ug | ORAL_TABLET | Freq: Every day | ORAL | Status: DC
  Administered 2023-04-18 – 2023-04-20 (×3): 100 ug via ORAL
  Filled 2023-04-18 (×4): qty 1

## 2023-04-18 MED ORDER — LACTATED RINGERS IV SOLN: INTRAVENOUS | Status: DC

## 2023-04-18 MED ORDER — THIAMINE HCL 100 MG/ML IJ SOLN
100.0000 mg | Freq: Every day | INTRAMUSCULAR | Status: DC
  Administered 2023-04-18: 100 mg via INTRAVENOUS
  Filled 2023-04-18: qty 2

## 2023-04-18 MED ORDER — LORAZEPAM 2 MG/ML IJ SOLN
1.0000 mg | Freq: Once | INTRAMUSCULAR | Status: AC | PRN
  Administered 2023-04-18: 1 mg via INTRAVENOUS
  Filled 2023-04-18: qty 1

## 2023-04-18 MED ORDER — FOLIC ACID 1 MG PO TABS
1.0000 mg | ORAL_TABLET | Freq: Every day | ORAL | Status: DC
  Administered 2023-04-18 – 2023-04-20 (×3): 1 mg via ORAL
  Filled 2023-04-18 (×3): qty 1

## 2023-04-18 MED ORDER — THIAMINE MONONITRATE 100 MG PO TABS
100.0000 mg | ORAL_TABLET | Freq: Every day | ORAL | Status: DC
  Administered 2023-04-19 – 2023-04-20 (×2): 100 mg via ORAL
  Filled 2023-04-18 (×2): qty 1

## 2023-04-18 MED ORDER — ACETAMINOPHEN 325 MG PO TABS: 650.0000 mg | ORAL_TABLET | Freq: Four times a day (QID) | ORAL | Status: DC | PRN

## 2023-04-18 MED ORDER — ENOXAPARIN SODIUM 40 MG/0.4ML IJ SOSY
40.0000 mg | PREFILLED_SYRINGE | INTRAMUSCULAR | Status: DC
  Administered 2023-04-18 – 2023-04-19 (×2): 40 mg via SUBCUTANEOUS
  Filled 2023-04-18 (×2): qty 0.4

## 2023-04-18 MED ORDER — ONDANSETRON HCL 4 MG/2ML IJ SOLN
4.0000 mg | Freq: Four times a day (QID) | INTRAMUSCULAR | Status: DC | PRN
  Administered 2023-04-18: 4 mg via INTRAVENOUS
  Filled 2023-04-18: qty 2

## 2023-04-18 MED ORDER — POTASSIUM CHLORIDE 10 MEQ/100ML IV SOLN
10.0000 meq | INTRAVENOUS | Status: AC
  Administered 2023-04-18 (×2): 10 meq via INTRAVENOUS
  Filled 2023-04-18 (×2): qty 100

## 2023-04-18 MED ORDER — SODIUM CHLORIDE 0.9 % IV BOLUS
1000.0000 mL | Freq: Once | INTRAVENOUS | Status: AC
  Administered 2023-04-18: 1000 mL via INTRAVENOUS

## 2023-04-18 MED ORDER — LORAZEPAM 2 MG/ML IJ SOLN
0.0000 mg | Freq: Four times a day (QID) | INTRAMUSCULAR | Status: DC
  Administered 2023-04-18 (×2): 2 mg via INTRAVENOUS
  Administered 2023-04-19: 3 mg via INTRAVENOUS
  Administered 2023-04-19: 1 mg via INTRAVENOUS
  Filled 2023-04-18 (×2): qty 1
  Filled 2023-04-18: qty 2
  Filled 2023-04-18: qty 1

## 2023-04-18 MED ORDER — LORAZEPAM 2 MG PO TABS: 0.0000 mg | ORAL_TABLET | Freq: Two times a day (BID) | ORAL | Status: DC

## 2023-04-18 MED ORDER — POTASSIUM CHLORIDE CRYS ER 20 MEQ PO TBCR
40.0000 meq | EXTENDED_RELEASE_TABLET | Freq: Once | ORAL | Status: AC
  Administered 2023-04-18: 40 meq via ORAL
  Filled 2023-04-18: qty 2

## 2023-04-18 MED ORDER — HYDRALAZINE HCL 20 MG/ML IJ SOLN: 5.0000 mg | INTRAMUSCULAR | Status: DC | PRN

## 2023-04-18 MED ORDER — LORAZEPAM 2 MG/ML IJ SOLN
1.0000 mg | Freq: Once | INTRAMUSCULAR | Status: AC
  Administered 2023-04-18: 1 mg via INTRAVENOUS
  Filled 2023-04-18: qty 1

## 2023-04-18 MED ORDER — THIAMINE MONONITRATE 100 MG PO TABS: 100.0000 mg | ORAL_TABLET | Freq: Every day | ORAL | Status: DC

## 2023-04-18 NOTE — Assessment & Plan Note (Signed)
Received repletion in the ED Continue to monitor

## 2023-04-18 NOTE — ED Provider Notes (Signed)
[facility] Provider Note    Event Date/Time   First MD Initiated Contact with Patient 04/18/23 1717     (approximate)   History   No chief complaint on file.   HPI  Melvin Knight is a 42 y.o. male with history of alcohol abuse who comes in with concern for withdrawal.  Patient reports that he last drank 36 hours ago and has been experiencing tremors, anxiousness as well as recurrent memory loops possible hallucinations.  He denies any current seizures.  He denies any known falls.  He states that he has had a history of alcohol withdrawal seizures previously.  He does report being able to get sober in the past for a few weeks.  He states that he typically drinks 6 shots daily denies any chest pain, shortness of breath, abdominal pain or any other concerns.  Physical Exam   Triage Vital Signs: Blood pressure (!) 159/124, pulse 94, temperature 99.5 F (37.5 C), temperature source Oral, resp. rate 16, height 5\' 7"  (1.702 m), weight 88.5 kg, SpO2 98 %.   Most recent vital signs: Vitals:   04/18/23 1841 04/18/23 1955  BP: (!) 172/118 (!) 159/124  Pulse: 85 94  Resp:    Temp:    SpO2:       General: Awake, no distress.  CV:  Good peripheral perfusion.  Resp:  Normal effort.  Abd:  No distention.  Soft nontender Other:  Moving all extremities well Patient does appear tremulous.  No C-spine tenderness  ED Results / Procedures / Treatments   Labs (all labs ordered are listed, but only abnormal results are displayed) Labs Reviewed  CBC WITH DIFFERENTIAL/PLATELET  COMPREHENSIVE METABOLIC PANEL  MAGNESIUM  TSH  FOLATE  ETHANOL  URINE DRUG SCREEN, QUALITATIVE ([facility] ONLY)     EKG  My interpretation of EKG:  Normal sinus rate of 98 without any ST elevation or T wave inversions except for lead III, normal intervals  RADIOLOGY I have reviewed the CT had personally interpreted no evidence of intercranial hemorrhage  PROCEDURES:  Critical Care  performed: No  .1-3 Lead EKG Interpretation  Performed by: Concha Se, MD Authorized by: Concha Se, MD     Interpretation: normal     ECG rate:  90   ECG rate assessment: normal     Rhythm: sinus rhythm     Ectopy: none     Conduction: normal   .Critical Care  Performed by: Concha Se, MD Authorized by: Concha Se, MD   Critical care provider statement:    Critical care time (minutes):  30   Critical care was necessary to treat or prevent imminent or life-threatening deterioration of the following conditions: withdraw.   Critical care was time spent personally by me on the following activities:  Development of treatment plan with patient or surrogate, discussions with consultants, evaluation of patient's response to treatment, examination of patient, ordering and review of laboratory studies, ordering and review of radiographic studies, ordering and performing treatments and interventions, pulse oximetry, re-evaluation of patient's condition and review of old charts    MEDICATIONS ORDERED IN ED: Medications - No data to display   IMPRESSION / MDM / ASSESSMENT AND PLAN / ED COURSE  I reviewed the triage vital signs and the nursing notes.   Patient's presentation is most consistent with acute presentation with potential threat to life or bodily function.   Patient comes in with some possible hallucinations, memory loops, alcohol withdrawal with  last alcohol use 36 hours ago.  Patient given 2 of IV Ativan.  Will get CT head just to ensure there is no evidence of subdural given the confusion the patient is reporting.  Will get labs to evaluate for Electra abnormalities, AKI.  Will give patient some fluids, thiamine, folate.  She was given 2 of IV Ativan.  Alcohol level was negative CBC reassuring CMP shows low potassium slight elevation of LFTs but on repeat abdominal exam remains soft and nontender has had this before most likely related to drinking.  Magnesium low at  1.4.  I rediscussed with patient he is feeling better.  His repeat CIWA was 8 and I did give an additional 1 mg of Ativan to help get ahead of the withdrawal.  I discussed with him his goals of care and he does report wanting to stop drinking alcohol given his history of complicated withdrawals and the concern for potential hallucinations this is concerning for potential recurrent complicated withdrawal as well as low electrolytes patient is comfortable with being admitted to the hospital.  The patient is on the cardiac monitor to evaluate for evidence of arrhythmia and/or significant heart rate changes.      FINAL CLINICAL IMPRESSION(S) / ED DIAGNOSES   Final diagnoses:  Alcohol withdrawal syndrome, with delirium (HCC)  Hypomagnesemia  Hypokalemia     Rx / DC Orders   ED Discharge Orders     None        Note:  This document was prepared using Dragon voice recognition software and may include unintentional dictation errors.   Concha Se, MD 04/18/23 2026

## 2023-04-18 NOTE — ED Triage Notes (Signed)
Pt arrives from home via [facility] with c/o alcohol withdrawal. Pt reports complaints of D/T for 36 hours, which was same as pt's last drink. Pt reports 6 shots of [place] Mist/day since college. Pt was at the [facility] in January for 6 weeks and managed to stay sober for a few weeks after discharge. Pt is reporting "looping" in his memory, which started around 1500 when he was watching a movie and he realized that he watched the same scene 3 times. Pt also reports hearing the same conversations multiple times when staff has just introduced themselves.

## 2023-04-18 NOTE — Assessment & Plan Note (Addendum)
Hold hydroxyzine

## 2023-04-18 NOTE — Assessment & Plan Note (Signed)
Received repletion in the ED Continue to monitor and replete as necessary 

## 2023-04-18 NOTE — H&P (Signed)
History and Physical    Patient: Melvin Knight NWG:[id number] DOB: Jul 27, 1981 DOA: 04/18/2023 DOS: the patient was seen and examined on 04/18/2023 PCP: Margarita Mail, DO  Patient coming from: Home  Chief Complaint:  Chief Complaint  Patient presents with   Delirium Tremens (DTS)   Alcohol Intoxication    HPI: Melvin Knight is a 42 y.o. male with medical history significant for HTN, anxiety, moderate alcohol abuse with history of alcohol withdrawal seizures who presents to the ED for evaluation of possible alcohol withdrawal.  Patient was hospitalized for alcohol withdrawal seizure in November 2023 following which he underwent a 6-week rehab and remained sober for several weeks after however he relapsed.  He has been drinking daily until about 36 hours ago and once he stopped drinking he started experiencing tremors, anxiousness and visual hallucinations which he describes as seeing the same image over and over again.  He has had no seizure activity ED course and data review: On arrival he was hypertensive at 173/119 and tachycardic to 103 with a low-grade temperature of 99.5.  Labs significant for hypokalemia of 3.1 and hypomagnesemia of 1.4.  CMP notable for AST 98, ALT 61, total bili 1.5.  Bicarb 20 with anion gap of 19. EKG, personally viewed and interpreted showing sinus tachycardia at 101 with no concerning ST-T wave changes. CT head nonacute Patient was given an NS bolus thiamine and started on CIWA protocol and got 2 doses of lorazepam by the time of request for admission.  He also got magnesium and potassium repletion. Hospitalist consulted for admission.   Review of Systems: As mentioned in the history of present illness. All other systems reviewed and are negative.  Past Medical History:  Diagnosis Date   AKI (acute kidney injury) (HCC) 09/24/2022   Anxiety    Hypertension    Hypokalemia    Hypokalemia 09/24/2022   Hypomagnesemia 09/24/2022   No past surgical history on  file. Social History:  reports that he has been smoking cigarettes. He has been smoking an average of 1 pack per day. He has never used smokeless tobacco. He reports current alcohol use of about 6.0 standard drinks of alcohol per week. He reports that he does not currently use drugs.  No Known Allergies  Family History  Problem Relation Age of Onset   Hypertension Father    Stroke Father    Cancer Sister     Prior to Admission medications   Medication Sig Start Date End Date Taking? Authorizing Provider  folic acid (FOLVITE) 1 MG tablet Take 1 tablet (1 mg total) by mouth daily. 09/26/22   Esaw Grandchild A, DO  hydrocortisone 1 % ointment Apply 1 Application topically 2 (two) times daily. 08/27/22   Margarita Mail, DO  hydrOXYzine (ATARAX) 10 MG tablet Take 1 tablet (10 mg total) by mouth 3 (three) times daily as needed for anxiety. 03/17/23   Margarita Mail, DO  Multiple Vitamins-Minerals (MULTIVITAMIN WITH MINERALS) tablet Take 1 tablet by mouth daily. 07/06/22   Margarita Mail, DO  thiamine (VITAMIN B-1) 100 MG tablet Take 1 tablet (100 mg total) by mouth daily. 09/26/22   Pennie Banter, DO  vitamin B-12 (CYANOCOBALAMIN) 100 MCG tablet Take 100 mcg by mouth daily.    [provider]    Physical Exam: Vitals:   04/18/23 1726 04/18/23 1728 04/18/23 1841  BP: (!) 173/119  (!) 172/118  Pulse: (!) 103  85  Resp: 16    Temp: 99.5 F (37.5 C)  TempSrc: Oral    SpO2: 98%    Weight:  88.5 kg   Height:  5\' 7"  (1.702 m)    Physical Exam Vitals and nursing note reviewed.  Constitutional:      General: He is not in acute distress. HENT:     Head: Normocephalic and atraumatic.  Cardiovascular:     Rate and Rhythm: Regular rhythm. Tachycardia present.     Heart sounds: Normal heart sounds.  Pulmonary:     Effort: Pulmonary effort is normal.     Breath sounds: Normal breath sounds.  Abdominal:     Palpations: Abdomen is soft.     Tenderness: There is no  abdominal tenderness.  Neurological:     Mental Status: Mental status is at baseline.     Comments: Patient evaluated after 2 doses of Ativan in the ED, and is awake alert, not tremulous     Labs on Admission: I have personally reviewed following labs and imaging studies  CBC: Recent Labs  Lab 04/18/23 1734  WBC 4.5  NEUTROABS 3.3  HGB 15.3  HCT 42.6  MCV 95.3  PLT 129*   Basic Metabolic Panel: Recent Labs  Lab 04/18/23 1734  NA 139  K 3.1*  CL 100  CO2 20*  GLUCOSE 133*  BUN 11  CREATININE 0.78  CALCIUM 9.2  MG 1.4*   GFR: Estimated Creatinine Clearance: 131.7 mL/min (by C-G formula based on SCr of 0.78 mg/dL). Liver Function Tests: Recent Labs  Lab 04/18/23 1734  AST 98*  ALT 61*  ALKPHOS 92  BILITOT 1.5*  PROT 8.1  ALBUMIN 4.9   No results for input(s): "LIPASE", "AMYLASE" in the last 168 hours. No results for input(s): "AMMONIA" in the last 168 hours. Coagulation Profile: No results for input(s): "INR", "PROTIME" in the last 168 hours. Cardiac Enzymes: No results for input(s): "CKTOTAL", "CKMB", "CKMBINDEX", "TROPONINI" in the last 168 hours. BNP (last 3 results) No results for input(s): "PROBNP" in the last 8760 hours. HbA1C: No results for input(s): "HGBA1C" in the last 72 hours. CBG: No results for input(s): "GLUCAP" in the last 168 hours. Lipid Profile: No results for input(s): "CHOL", "HDL", "LDLCALC", "TRIG", "CHOLHDL", "LDLDIRECT" in the last 72 hours. Thyroid Function Tests: Recent Labs    04/18/23 1734  TSH 1.562   Anemia Panel: Recent Labs    04/18/23 1734  FOLATE 8.4   Urine analysis:    Component Value Date/Time   COLORURINE AMBER (A) 09/24/2022 0051   APPEARANCEUR CLOUDY (A) 09/24/2022 0051   LABSPEC 1.025 09/24/2022 0051   PHURINE 5.0 09/24/2022 0051   GLUCOSEU NEGATIVE 09/24/2022 0051   HGBUR NEGATIVE 09/24/2022 0051   BILIRUBINUR NEGATIVE 09/24/2022 0051   KETONESUR 5 (A) 09/24/2022 0051   PROTEINUR 100 (A)  09/24/2022 0051   NITRITE NEGATIVE 09/24/2022 0051   LEUKOCYTESUR NEGATIVE 09/24/2022 0051    Radiological Exams on Admission: CT HEAD WO CONTRAST ( )  Result Date: 04/18/2023 CLINICAL DATA:  Headache, neuro deficit EXAM: CT HEAD WITHOUT CONTRAST TECHNIQUE: Contiguous axial images were obtained from the base of the skull through the vertex without intravenous contrast. RADIATION DOSE REDUCTION: This exam was performed according to the departmental dose-optimization program which includes automated exposure control, adjustment of the mA and/or kV according to patient size and/or use of iterative reconstruction technique. COMPARISON:  CT head 09/23/2022, MRI head 09/24/2022 FINDINGS: Brain: No evidence of large-territorial acute infarction. No parenchymal hemorrhage. No mass lesion. No extra-axial collection. No mass effect or midline shift. No hydrocephalus.  Basilar cisterns are patent. Vascular: No hyperdense vessel. Skull: No acute fracture or focal lesion. Sinuses/Orbits: Paranasal sinuses and mastoid air cells are clear. The orbits are unremarkable. Other: None. IMPRESSION: No acute intracranial abnormality. Electronically Signed   By: Tish Frederickson M.D.   On: 04/18/2023 18:21     Data Reviewed: Relevant notes from primary care and specialist visits, past discharge summaries as available in EHR, including Care Everywhere. Prior diagnostic testing as pertinent to current admission diagnoses Updated medications and problem lists for reconciliation ED course, including vitals, labs, imaging, treatment and response to treatment Triage notes, nursing and pharmacy notes and ED provider's notes Notable results as noted in HPI   Assessment and Plan: * Alcohol withdrawal delirium, acute, mixed level of activity (HCC) Moderate alcohol use disorder with history of alcohol withdrawal seizure Patient with tremulousness, altered thoughts/hallucinations, hypertensive and tachycardic Continue CIWA  protocol Ativan as needed seizure given history Folate and thiamine Fall and aspiration precaution  Hypertensive urgency BP uncontrolled, likely related to withdrawal Should improve with treatment of alcohol withdrawal delirium Hydralazine as needed  Hypomagnesemia Received repletion in the ED Continue to monitor and replete as necessary  Hypokalemia Received repletion in the ED Continue to monitor  Anxiety and depression Hold hydroxyzine    DVT prophylaxis: Lovenox  Consults: none  Advance Care Planning:   Code Status: Prior   Family Communication: none  Disposition Plan: Back to previous home environment  Severity of Illness: The appropriate patient status for this patient is INPATIENT. Inpatient status is judged to be reasonable and necessary in order to provide the required intensity of service to ensure the patient's safety. The patient's presenting symptoms, physical exam findings, and initial radiographic and laboratory data in the context of their chronic comorbidities is felt to place them at high risk for further clinical deterioration. Furthermore, it is not anticipated that the patient will be medically stable for discharge from the hospital within 2 midnights of admission.   * I certify that at the point of admission it is my clinical judgment that the patient will require inpatient hospital care spanning beyond 2 midnights from the point of admission due to high intensity of service, high risk for further deterioration and high frequency of surveillance required.*  Author: Andris Baumann, MD 04/18/2023 7:55 PM  For on call review [email or web address].

## 2023-04-18 NOTE — Assessment & Plan Note (Signed)
BP uncontrolled, likely related to withdrawal Should improve with treatment of alcohol withdrawal delirium Hydralazine as needed 

## 2023-04-18 NOTE — Assessment & Plan Note (Addendum)
Moderate alcohol use disorder with history of alcohol withdrawal seizure Patient with tremulousness, altered thoughts/hallucinations, hypertensive and tachycardic Continue CIWA protocol Ativan as needed seizure given history Folate and thiamine Fall and aspiration precaution

## 2023-04-18 NOTE — ED Notes (Signed)
Patient transported to CT 

## 2023-04-18 NOTE — Assessment & Plan Note (Deleted)
BP uncontrolled, likely related to withdrawal Should improve with treatment of alcohol withdrawal delirium Hydralazine as needed

## 2023-04-19 ENCOUNTER — Encounter: Payer: Self-pay | Admitting: Internal Medicine

## 2023-04-19 ENCOUNTER — Other Ambulatory Visit: Payer: Self-pay

## 2023-04-19 DIAGNOSIS — F10231 Alcohol dependence with withdrawal delirium: Secondary | ICD-10-CM | POA: Diagnosis not present

## 2023-04-19 LAB — BASIC METABOLIC PANEL
Anion gap: 11 (ref 5–15)
BUN: 8 mg/dL (ref 6–20)
CO2: 23 mmol/L (ref 22–32)
Calcium: 9.1 mg/dL (ref 8.9–10.3)
Chloride: 104 mmol/L (ref 98–111)
Creatinine, Ser: 0.72 mg/dL (ref 0.61–1.24)
GFR, Estimated: >60 mL/min (ref >60)
Glucose, Bld: 142 mg/dL — ABNORMAL HIGH (ref 70–99)
Potassium: 3.3 mmol/L — ABNORMAL LOW (ref 3.5–5.1)
Sodium: 138 mmol/L (ref 135–145)

## 2023-04-19 LAB — PHOSPHORUS: Phosphorus: 2.4 mg/dL — ABNORMAL LOW (ref 2.5–4.6)

## 2023-04-19 LAB — MAGNESIUM
Magnesium: 2.2 mg/dL (ref 1.7–2.4)
Magnesium: 2 mg/dL (ref 1.7–2.4)

## 2023-04-19 MED ORDER — POTASSIUM CHLORIDE CRYS ER 20 MEQ PO TBCR
40.0000 meq | EXTENDED_RELEASE_TABLET | Freq: Once | ORAL | Status: AC
  Administered 2023-04-19: 40 meq via ORAL
  Filled 2023-04-19: qty 2

## 2023-04-19 MED ORDER — IPRATROPIUM-ALBUTEROL 0.5-2.5 (3) MG/3ML IN SOLN: 3.0000 mL | RESPIRATORY_TRACT | Status: DC | PRN

## 2023-04-19 MED ORDER — CHLORDIAZEPOXIDE HCL 25 MG PO CAPS
25.0000 mg | ORAL_CAPSULE | Freq: Two times a day (BID) | ORAL | Status: DC
  Administered 2023-04-20: 25 mg via ORAL
  Filled 2023-04-19: qty 1

## 2023-04-19 MED ORDER — TRAZODONE HCL 50 MG PO TABS: 50.0000 mg | ORAL_TABLET | Freq: Every evening | ORAL | Status: DC | PRN

## 2023-04-19 MED ORDER — SENNOSIDES-DOCUSATE SODIUM 8.6-50 MG PO TABS: 1.0000 | ORAL_TABLET | Freq: Every evening | ORAL | Status: DC | PRN

## 2023-04-19 MED ORDER — CHLORDIAZEPOXIDE HCL 25 MG PO CAPS
25.0000 mg | ORAL_CAPSULE | Freq: Three times a day (TID) | ORAL | Status: AC
  Administered 2023-04-19 (×3): 25 mg via ORAL
  Filled 2023-04-19 (×3): qty 1

## 2023-04-19 MED ORDER — POTASSIUM PHOSPHATES 15 MMOLE/5ML IV SOLN
15.0000 mmol | Freq: Once | INTRAVENOUS | Status: AC
  Administered 2023-04-19: 15 mmol via INTRAVENOUS
  Filled 2023-04-19: qty 5

## 2023-04-19 MED ORDER — METOPROLOL TARTRATE 5 MG/5ML IV SOLN: 5.0000 mg | INTRAVENOUS | Status: DC | PRN

## 2023-04-19 MED ORDER — CHLORDIAZEPOXIDE HCL 25 MG PO CAPS: 25.0000 mg | ORAL_CAPSULE | Freq: Every day | ORAL | Status: DC

## 2023-04-19 MED ORDER — HYDRALAZINE HCL 20 MG/ML IJ SOLN: 10.0000 mg | INTRAMUSCULAR | Status: DC | PRN

## 2023-04-19 MED ORDER — GUAIFENESIN 100 MG/5ML PO LIQD: 5.0000 mL | ORAL | Status: DC | PRN

## 2023-04-19 MED ORDER — DM-GUAIFENESIN ER 30-600 MG PO TB12: 1.0000 | ORAL_TABLET | Freq: Two times a day (BID) | ORAL | Status: DC | PRN

## 2023-04-19 NOTE — ED Notes (Signed)
Patient up eating breakfast tray at this time. 

## 2023-04-19 NOTE — Progress Notes (Signed)
Patient refused all fall precaution and safety measures.

## 2023-04-19 NOTE — ED Notes (Signed)
Pt noted to be up at side of bed, attempting to reach toilet in room. Pt unsteady in gait. Pt assisted to use toilet and placed back in bed in position of comfort. Instructed on use and benefit of using call bell for safety to prevent injury or falls. Pt voices understanding. Bed alarm in place at this time, activated, and audible. Non slip socks and fall writ  band placed on pt. Call bell in reach and bed locked in lowest position. Door to pt room open.

## 2023-04-19 NOTE — Progress Notes (Signed)
Patient pacing circles around nurses station looking for people who aren't present. Unsteady gait noted and lack of safety awareness. Patient frequently walking to exit doors despite being re-directed to room multiple times.

## 2023-04-19 NOTE — Progress Notes (Signed)
Patient admitted to unit from ER. Patient noted to be covered in feces. Seemingly unaware of incont and/or hygiene associated with it. Patient wondering around room aimlessly stating he is searching for a phone. Patient noted to have phone in hand. Unsure if a second phone exists or not. Patient seems to be perseverating on looking for phone. Beads of perspiration noted on forehead.

## 2023-04-19 NOTE — Consult Note (Signed)
PHARMACY CONSULT NOTE - FOLLOW UP  Pharmacy Consult for Electrolyte Monitoring and Replacement   Recent Labs: Potassium (mmol/L)  Date Value  04/19/2023 3.3 (L)   Magnesium (mg/dL)  Date Value  16/08/9603 2.0   Calcium (mg/dL)  Date Value  54/07/8118 9.1   Albumin (g/dL)  Date Value  14/78/2956 4.9   Phosphorus (mg/dL)  Date Value  21/30/8657 2.4 (L)   Sodium (mmol/L)  Date Value  04/19/2023 138     Assessment: Patient admitted with EtOH intoxication and delirium. Pharmacy consulted to manage electrolytes.  Goal of Therapy:  Electrolytes within normal limits.  Plan:  K= 3.3, will replace with Kcl po x 1 Phos = 2.4, will replace with Kphos IV x 1 dose Mg, Na, and calcium all within normal limits. Will follow up AM labs and replace electrolytes as needed.  Jhania Etherington Rodriguez-Guzman PharmD, BCPS 04/19/2023 10:25 AM

## 2023-04-19 NOTE — Progress Notes (Signed)
PROGRESS NOTE    Melvin Knight  ZOX:[id number] DOB: Feb 21, 1981 DOA: 04/18/2023 PCP: Margarita Mail, DO   Brief Narrative:  42 year old with history of alcohol abuse, HTN, anxiety comes to the hospital with concerns of alcohol withdrawal.  Previously she had alcohol withdrawal seizures back in November 2023.  Upon admission noted to be hypertensive and tachycardic with electrolyte abnormalities.  CT head unremarkable Alcohol withdrawal protocol.   Assessment & Plan:  Principal Problem:   Alcohol withdrawal delirium, acute, mixed level of activity (HCC) Active Problems:   History of seizure due to alcohol withdrawal   Alcohol use disorder, moderate, dependence (HCC)   Hypokalemia   Hypomagnesemia   Hypertensive urgency   Anxiety and depression     Assessment and Plan: * Alcohol withdrawal delirium, acute, mixed level of activity (HCC) Moderate alcohol use disorder with history of alcohol withdrawal seizure High risk of severe alcohol withdrawal.  Currently on alcohol withdrawal protocol, thiamine, folic acid and multivitamin.  Will start Librium taper  Hypertensive urgency Secondary to alcohol withdrawal.  IV as needed  Hypomagnesemia/hypokalemia -As needed repletion  Anxiety and depression Hold hydroxyzine    DVT prophylaxis: Lovenox Code Status: Full code Family Communication:   Status is: Inpatient Continue hospital stay for concerns of severe withdrawal       Diet Orders (From admission, onward)     Start     Ordered   04/18/23 2008  Diet Heart Room service appropriate? Yes; Fluid consistency: Thin  Diet effective now       Question Answer Comment  Room service appropriate? Yes   Fluid consistency: Thin      04/18/23 2008            Subjective: Patient is quite delirious in the ER.  Cannot get out of bed and move around.  Apparently he got out of the bed overnight pulled all of his IV and had feces all over him.  Examination:  General  exam: Appears calm and comfortable  Respiratory system: Clear to auscultation. Respiratory effort normal. Cardiovascular system: S1 & S2 heard, RRR. No JVD, murmurs, rubs, gallops or clicks. No pedal edema. Gastrointestinal system: Abdomen is nondistended, soft and nontender. No organomegaly or masses felt. Normal bowel sounds heard. Central nervous system: Alert and oriented. No focal neurological deficits. Extremities: Symmetric 5 x 5 power. Skin: No rashes, lesions or ulcers Psychiatry: Judgement and insight appear very poor  Objective: Vitals:   04/19/23 0513 04/19/23 0534 04/19/23 0536 04/19/23 0734  BP: (!) 155/61 (!) 159/81 (!) 159/81 (!) 170/122  Pulse: 91 97 97 99  Resp:   16   Temp:   98.1 F (36.7 C)   TempSrc:   Oral   SpO2:   97%   Weight:      Height:        Intake/Output Summary (Last 24 hours) at 04/19/2023 0834 Last data filed at 04/19/2023 0725 Gross per 24 hour  Intake 1064.22 ml  Output --  Net 1064.22 ml   Filed Weights   04/18/23 1728  Weight: 88.5 kg    Scheduled Meds:  enoxaparin (LOVENOX) injection  40 mg Subcutaneous Q24H   folic acid  1 mg Oral Daily   LORazepam  0-4 mg Intravenous Q6H   Or   LORazepam  0-4 mg Oral Q6H   [START ON 04/21/2023] LORazepam  0-4 mg Intravenous Q12H   Or   [START ON 04/21/2023] LORazepam  0-4 mg Oral Q12H   multivitamin with minerals  1 tablet  Oral Daily   thiamine  100 mg Oral Daily   Or   thiamine  100 mg Intravenous Daily   vitamin B-12  100 mcg Oral Daily   Continuous Infusions:  lactated ringers 100 mL/hr at 04/19/23 0725    Nutritional status     Body mass index is 30.54 kg/m.  Data Reviewed:   CBC: Recent Labs  Lab 04/18/23 1734  WBC 4.5  NEUTROABS 3.3  HGB 15.3  HCT 42.6  MCV 95.3  PLT 129*   Basic Metabolic Panel: Recent Labs  Lab 04/18/23 1734 04/19/23 0520  NA 139  --   K 3.1*  --   CL 100  --   CO2 20*  --   GLUCOSE 133*  --   BUN 11  --   CREATININE 0.78  --   CALCIUM  9.2  --   MG 1.4* 2.2   GFR: Estimated Creatinine Clearance: 131.7 mL/min (by C-G formula based on SCr of 0.78 mg/dL). Liver Function Tests: Recent Labs  Lab 04/18/23 1734  AST 98*  ALT 61*  ALKPHOS 92  BILITOT 1.5*  PROT 8.1  ALBUMIN 4.9   No results for input(s): "LIPASE", "AMYLASE" in the last 168 hours. No results for input(s): "AMMONIA" in the last 168 hours. Coagulation Profile: No results for input(s): "INR", "PROTIME" in the last 168 hours. Cardiac Enzymes: No results for input(s): "CKTOTAL", "CKMB", "CKMBINDEX", "TROPONINI" in the last 168 hours. BNP (last 3 results) No results for input(s): "PROBNP" in the last 8760 hours. HbA1C: No results for input(s): "HGBA1C" in the last 72 hours. CBG: No results for input(s): "GLUCAP" in the last 168 hours. Lipid Profile: No results for input(s): "CHOL", "HDL", "LDLCALC", "TRIG", "CHOLHDL", "LDLDIRECT" in the last 72 hours. Thyroid Function Tests: Recent Labs    04/18/23 1734  TSH 1.562   Anemia Panel: Recent Labs    04/18/23 1734  FOLATE 8.4   Sepsis Labs: No results for input(s): "PROCALCITON", "LATICACIDVEN" in the last 168 hours.  No results found for this or any previous visit (from the past 240 hour(s)).       Radiology Studies: CT HEAD WO CONTRAST ( )  Result Date: 04/18/2023 CLINICAL DATA:  Headache, neuro deficit EXAM: CT HEAD WITHOUT CONTRAST TECHNIQUE: Contiguous axial images were obtained from the base of the skull through the vertex without intravenous contrast. RADIATION DOSE REDUCTION: This exam was performed according to the departmental dose-optimization program which includes automated exposure control, adjustment of the mA and/or kV according to patient size and/or use of iterative reconstruction technique. COMPARISON:  CT head 09/23/2022, MRI head 09/24/2022 FINDINGS: Brain: No evidence of large-territorial acute infarction. No parenchymal hemorrhage. No mass lesion. No extra-axial collection.  No mass effect or midline shift. No hydrocephalus. Basilar cisterns are patent. Vascular: No hyperdense vessel. Skull: No acute fracture or focal lesion. Sinuses/Orbits: Paranasal sinuses and mastoid air cells are clear. The orbits are unremarkable. Other: None. IMPRESSION: No acute intracranial abnormality. Electronically Signed   By: Tish Frederickson M.D.   On: 04/18/2023 18:21           LOS: 1 day   Time spent= 35 mins    Shakiyla Kook Joline Maxcy, MD [facility]  If 7PM-7AM, please contact night-coverage  04/19/2023, 8:34 AM

## 2023-04-19 NOTE — ED Notes (Signed)
Patient sitting in bed talking on phone. NAD noted. Respirations even and unlabored.

## 2023-04-20 DIAGNOSIS — F10231 Alcohol dependence with withdrawal delirium: Secondary | ICD-10-CM | POA: Diagnosis not present

## 2023-04-20 LAB — CBC
HCT: 42 % (ref 39.0–52.0)
Hemoglobin: 14.9 g/dL (ref 13.0–17.0)
MCH: 34 pg (ref 26.0–34.0)
MCHC: 35.5 g/dL (ref 30.0–36.0)
MCV: 95.9 fL (ref 80.0–100.0)
Platelets: 98 10*3/uL — ABNORMAL LOW (ref 150–400)
RBC: 4.38 MIL/uL (ref 4.22–5.81)
RDW: 12.4 % (ref 11.5–15.5)
WBC: 4 10*3/uL (ref 4.0–10.5)
nRBC: 0 % (ref 0.0–0.2)

## 2023-04-20 LAB — BASIC METABOLIC PANEL
Anion gap: 12 (ref 5–15)
BUN: 8 mg/dL (ref 6–20)
CO2: 22 mmol/L (ref 22–32)
Calcium: 9.3 mg/dL (ref 8.9–10.3)
Chloride: 104 mmol/L (ref 98–111)
Creatinine, Ser: 0.79 mg/dL (ref 0.61–1.24)
GFR, Estimated: >60 mL/min (ref >60)
Glucose, Bld: 97 mg/dL (ref 70–99)
Potassium: 3.6 mmol/L (ref 3.5–5.1)
Sodium: 138 mmol/L (ref 135–145)

## 2023-04-20 LAB — MAGNESIUM: Magnesium: 1.9 mg/dL (ref 1.7–2.4)

## 2023-04-20 MED ORDER — POTASSIUM CHLORIDE CRYS ER 20 MEQ PO TBCR
20.0000 meq | EXTENDED_RELEASE_TABLET | Freq: Once | ORAL | Status: DC
  Filled 2023-04-20: qty 1

## 2023-04-20 NOTE — Consult Note (Signed)
PHARMACY CONSULT NOTE - FOLLOW UP  Pharmacy Consult for Electrolyte Monitoring and Replacement   Recent Labs: Potassium (mmol/L)  Date Value  04/20/2023 3.6   Magnesium (mg/dL)  Date Value  82/95/6213 1.9   Calcium (mg/dL)  Date Value  08/65/7846 9.3   Albumin (g/dL)  Date Value  96/29/5284 4.9   Phosphorus (mg/dL)  Date Value  13/24/4010 2.4 (L)   Sodium (mmol/L)  Date Value  04/20/2023 138     Assessment: Patient admitted with EtOH intoxication and delirium. Pharmacy consulted to manage electrolytes.  LR @ 100 ml/hr.   Goal of Therapy:  Electrolytes within normal limits.  Plan:  KCL 20 mEq PO x 1.  F/u with AM labs.   Paschal Dopp, PharmD, BCPS 04/20/2023 8:50 AM

## 2023-04-20 NOTE — Discharge Summary (Signed)
Physician Discharge Summary  Melvin Knight ZOX:[id number] DOB: [birth year] DOA: 04/18/2023  PCP: Margarita Mail, DO  Admit date: 04/18/2023 Discharge date: 04/20/2023  Admitted From: Home Home Home Left AMA Home Disposition:  Left AMA  Recommendations for Outpatient Follow-up:  LEFT AMA  Home Health: Equipment/Devices: Discharge Condition: Stable CODE STATUS:  Diet recommendation:   Brief/Interim Summary: 42 year old with history of alcohol abuse, HTN, anxiety comes to the hospital with concerns of alcohol withdrawal.  Previously she had alcohol withdrawal seizures back in November 2023.  Upon admission noted to be hypertensive and tachycardic with electrolyte abnormalities.  CT head unremarkable Alcohol withdrawal protocol. Following day patient left AMA, nursing staff was able to discuss risks and benefit of leaving AGAINST MEDICAL ADVICE.  I was notified after patient had already left.      Assessment & Plan:  Principal Problem:   Alcohol withdrawal delirium, acute, mixed level of activity (HCC) Active Problems:   History of seizure due to alcohol withdrawal   Alcohol use disorder, moderate, dependence (HCC)   Hypokalemia   Hypomagnesemia   Hypertensive urgency   Anxiety and depression       Assessment and Plan: * Alcohol withdrawal delirium, acute, mixed level of activity (HCC) Moderate alcohol use disorder with history of alcohol withdrawal seizure High risk of severe alcohol withdrawal undergoing treatment.  Patient decided to leave AGAINST MEDICAL ADVICE.   Hypertensive urgency Secondary to alcohol withdrawal.    Hypomagnesemia/hypokalemia -As needed repletion   Anxiety and depression Hold hydroxyzine       Discharge Diagnoses:  Principal Problem:   Alcohol withdrawal delirium, acute, mixed level of activity (HCC) Active Problems:   History of seizure due to alcohol withdrawal   Alcohol use disorder, moderate, dependence (HCC)   Hypokalemia    Hypomagnesemia   Hypertensive urgency   Anxiety and depression      Consultations: None  Subjective: Patient seen during rounds this morning.  Did not have any complaints but we did discuss that he is still at high risk of severe alcohol withdrawal therefore he needs to remain in the hospital for further treatment.  He was agreeable.    Discharge Exam: Vitals:   04/19/23 2035 04/20/23 0429  BP: (!) 155/124 (!) 157/113  Pulse: 96 95  Resp: 17 17  Temp: 98.9 F (37.2 C) 98.8 F (37.1 C)  SpO2: 100% 100%   Vitals:   04/19/23 1448 04/19/23 1615 04/19/23 2035 04/20/23 0429  BP: (!) 167/120 (!) 167/119 (!) 155/124 (!) 157/113  Pulse: 95 99 96 95  Resp: 16 18 17 17   Temp: 99.3 F (37.4 C) 98.2 F (36.8 C) 98.9 F (37.2 C) 98.8 F (37.1 C)  TempSrc: Oral     SpO2: 98% 99% 100% 100%  Weight:      Height:        General: Pt is alert, awake, not in acute distress Cardiovascular: RRR, S1/S2 +, no rubs, no gallops Respiratory: CTA bilaterally, no wheezing, no rhonchi Abdominal: Soft, NT, ND, bowel sounds + Extremities: no edema, no cyanosis Normal judgment and insight.  Alert awake oriented X.3  Discharge Instructions    No Known Allergies  You were cared for by a hospitalist during your hospital stay. If you have any questions about your discharge medications or the care you received while you were in the hospital after you are discharged, you can call the unit and asked to speak with the hospitalist on call if the hospitalist that took care of you is not  available. Once you are discharged, your primary care physician will handle any further medical issues. Please note that no refills for any discharge medications will be authorized once you are discharged, as it is imperative that you return to your primary care physician (or establish a relationship with a primary care physician if you do not have one) for your aftercare needs so that they can reassess your need for  medications and monitor your lab values.  You were cared for by a hospitalist during your hospital stay. If you have any questions about your discharge medications or the care you received while you were in the hospital after you are discharged, you can call the unit and asked to speak with the hospitalist on call if the hospitalist that took care of you is not available. Once you are discharged, your primary care physician will handle any further medical issues. Please note that NO REFILLS for any discharge medications will be authorized once you are discharged, as it is imperative that you return to your primary care physician (or establish a relationship with a primary care physician if you do not have one) for your aftercare needs so that they can reassess your need for medications and monitor your lab values.  Please request your Prim.MD to go over all Hospital Tests and Procedure/Radiological results at the follow up, please get all Hospital records sent to your Prim MD by signing hospital release before you go home.  Get CBC, CMP, 2 view Chest X ray checked  by Primary MD during your next visit or SNF MD in 5-7 days ( we routinely change or add medications that can affect your baseline labs and fluid status, therefore we recommend that you get the mentioned basic workup next visit with your PCP, your PCP may decide not to get them or add new tests based on their clinical decision)  On your next visit with your primary care physician please Get Medicines reviewed and adjusted.  If you experience worsening of your admission symptoms, develop shortness of breath, life threatening emergency, suicidal or homicidal thoughts you must seek medical attention immediately by calling 911 or calling your MD immediately  if symptoms less severe.  You Must read complete instructions/literature along with all the possible adverse reactions/side effects for all the Medicines you take and that have been prescribed  to you. Take any new Medicines after you have completely understood and accpet all the possible adverse reactions/side effects.   Do not drive, operate heavy machinery, perform activities at heights, swimming or participation in water activities or provide baby sitting services if your were admitted for syncope or siezures until you have seen by Primary MD or a Neurologist and advised to do so again.  Do not drive when taking Pain medications.   Procedures/Studies: CT HEAD WO CONTRAST ( )  Result Date: 04/18/2023 CLINICAL DATA:  Headache, neuro deficit EXAM: CT HEAD WITHOUT CONTRAST TECHNIQUE: Contiguous axial images were obtained from the base of the skull through the vertex without intravenous contrast. RADIATION DOSE REDUCTION: This exam was performed according to the departmental dose-optimization program which includes automated exposure control, adjustment of the mA and/or kV according to patient size and/or use of iterative reconstruction technique. COMPARISON:  CT head 09/23/2022, MRI head 09/24/2022 FINDINGS: Brain: No evidence of large-territorial acute infarction. No parenchymal hemorrhage. No mass lesion. No extra-axial collection. No mass effect or midline shift. No hydrocephalus. Basilar cisterns are patent. Vascular: No hyperdense vessel. Skull: No acute fracture or focal lesion. Sinuses/Orbits:  Paranasal sinuses and mastoid air cells are clear. The orbits are unremarkable. Other: None. IMPRESSION: No acute intracranial abnormality. Electronically Signed   By: Tish Frederickson M.D.   On: 04/18/2023 18:21     The results of significant diagnostics from this hospitalization (including imaging, microbiology, ancillary and laboratory) are listed below for reference.     Microbiology: No results found for this or any previous visit (from the past 240 hour(s)).   Labs: BNP (last 3 results) No results for input(s): "BNP" in the last 8760 hours. Basic Metabolic Panel: Recent Labs  Lab  04/18/23 1734 04/19/23 0520 04/19/23 0837 04/20/23 0428  NA 139  --  138 138  K 3.1*  --  3.3* 3.6  CL 100  --  104 104  CO2 20*  --  23 22  GLUCOSE 133*  --  142* 97  BUN 11  --  8 8  CREATININE 0.78  --  0.72 0.79  CALCIUM 9.2  --  9.1 9.3  MG 1.4* 2.2 2.0 1.9  PHOS  --   --  2.4*  --    Liver Function Tests: Recent Labs  Lab 04/18/23 1734  AST 98*  ALT 61*  ALKPHOS 92  BILITOT 1.5*  PROT 8.1  ALBUMIN 4.9   No results for input(s): "LIPASE", "AMYLASE" in the last 168 hours. No results for input(s): "AMMONIA" in the last 168 hours. CBC: Recent Labs  Lab 04/18/23 1734 04/20/23 0428  WBC 4.5 4.0  NEUTROABS 3.3  --   HGB 15.3 14.9  HCT 42.6 42.0  MCV 95.3 95.9  PLT 129* 98*   Cardiac Enzymes: No results for input(s): "CKTOTAL", "CKMB", "CKMBINDEX", "TROPONINI" in the last 168 hours. BNP: Invalid input(s): "POCBNP" CBG: No results for input(s): "GLUCAP" in the last 168 hours. D-Dimer No results for input(s): "DDIMER" in the last 72 hours. Hgb A1c No results for input(s): "HGBA1C" in the last 72 hours. Lipid Profile No results for input(s): "CHOL", "HDL", "LDLCALC", "TRIG", "CHOLHDL", "LDLDIRECT" in the last 72 hours. Thyroid function studies Recent Labs    04/18/23 1734  TSH 1.562   Anemia work up Recent Labs    04/18/23 1734  FOLATE 8.4   Urinalysis    Component Value Date/Time   COLORURINE AMBER (A) 09/24/2022 0051   APPEARANCEUR CLOUDY (A) 09/24/2022 0051   LABSPEC 1.025 09/24/2022 0051   PHURINE 5.0 09/24/2022 0051   GLUCOSEU NEGATIVE 09/24/2022 0051   HGBUR NEGATIVE 09/24/2022 0051   BILIRUBINUR NEGATIVE 09/24/2022 0051   KETONESUR 5 (A) 09/24/2022 0051   PROTEINUR 100 (A) 09/24/2022 0051   NITRITE NEGATIVE 09/24/2022 0051   LEUKOCYTESUR NEGATIVE 09/24/2022 0051   Sepsis Labs Recent Labs  Lab 04/18/23 1734 04/20/23 0428  WBC 4.5 4.0   Microbiology No results found for this or any previous visit (from the past 240  hour(s)).   Time coordinating discharge:  I have spent 35 minutes face to face with the patient and on the ward discussing the patients care, assessment, plan and disposition with other care givers. >50% of the time was devoted counseling the patient about the risks and benefits of treatment/Discharge disposition and coordinating care.   SIGNED:   Dimple Nanas, MD  [facility] 04/20/2023, 11:10 AM   If 7PM-7AM, please contact night-coverage

## 2023-04-20 NOTE — Progress Notes (Signed)
Patient signed AMA paper and left after RN removed PIV. Patient verbalized total understanding of risks associated with leaving including but not limited to death. Patient encouraged NOT to drive--will be ubering home. MD made aware

## 2023-04-20 NOTE — Progress Notes (Signed)
Mobility Specialist - Progress Note   04/20/23 0835  Mobility  Activity Ambulated independently in hallway;Stood at bedside;Dangled on edge of bed  Level of Assistance Independent  Assistive Device None  Distance Ambulated (ft) 160 ft  Activity Response Tolerated well  Mobility Referral Yes  $Mobility charge 1 Mobility  Mobility Specialist Start Time (ACUTE ONLY) 0824  Mobility Specialist Stop Time (ACUTE ONLY) 0830  Mobility Specialist Time Calculation (min) (ACUTE ONLY) 6 min   Pt sitting EOB on RA upon arrival. Pt STS and ambulates 1 lap around NS Indep. Pt returns to EOB with needs in reach and sitter present.   Terrilyn Saver  Mobility Specialist  04/20/23 8:36 AM

## 2023-04-21 ENCOUNTER — Telehealth: Payer: Self-pay

## 2023-04-21 NOTE — Transitions of Care (Post Inpatient/ED Visit) (Signed)
   04/21/2023  Name: Melvin Knight MRN: [id number] DOB: 04-08-1981  Today's TOC FU Call Status: Today's TOC FU Call Status:: Successful TOC FU Call Competed TOC FU Call Complete Date: 04/21/23  Transition Care Management Follow-up Telephone Call Date of Discharge: 04/20/23 Discharge Facility: [facility]) Type of Discharge: Inpatient Admission Primary Inpatient Discharge Diagnosis:: alcohol use How have you been since you were released from the hospital?: Better Any questions or concerns?: No  Items Reviewed: Did you receive and understand the discharge instructions provided?: No Any new allergies since your discharge?: No Dietary orders reviewed?: Yes Do you have support at home?: No  Medications Reviewed Today: Medications Reviewed Today     Reviewed by Karena Addison, LPN (Licensed Practical Nurse) on 04/21/23 at [phone]  Med List Status: <None>   Medication Order Taking? Sig Documenting Provider Last Dose Status Informant  folic acid (FOLVITE) 1 MG tablet [id number] No Take 1 tablet (1 mg total) by mouth daily.  Patient not taking: Reported on 04/18/2023   Pennie Banter, DO Not Taking Active Self  hydrocortisone 1 % ointment [id number] No Apply 1 Application topically 2 (two) times daily.  Patient not taking: Reported on 04/18/2023   Margarita Mail, DO Not Taking Active Self  hydrOXYzine (ATARAX) 10 MG tablet [id number] No Take 1 tablet (10 mg total) by mouth 3 (three) times daily as needed for anxiety.  Patient not taking: Reported on 04/18/2023   Margarita Mail, DO Not Taking Active Self  Multiple Vitamins-Minerals (MULTIVITAMIN WITH MINERALS) tablet [id number] No Take 1 tablet by mouth daily.  Patient not taking: Reported on 04/18/2023   Margarita Mail, DO Not Taking Active Self  thiamine (VITAMIN B-1) 100 MG tablet [id number] No Take 1 tablet (100 mg total) by mouth daily.  Patient not taking: Reported on 04/18/2023   Pennie Banter,  DO Not Taking Active Self  traZODone (DESYREL) 100 MG tablet [id number] No Take 100 mg by mouth at bedtime.  Patient not taking: Reported on 04/18/2023   [provider] Not Taking Active Self  vitamin B-12 (CYANOCOBALAMIN) 100 MCG tablet [id number]  Take 100 mcg by mouth daily.  Patient not taking: Reported on 04/18/2023   [provider]  Active Self            Home Care and Equipment/Supplies: Were Home Health Services Ordered?: NA Any new equipment or medical supplies ordered?: NA  Functional Questionnaire: Do you need assistance with bathing/showering or dressing?: No Do you need assistance with meal preparation?: No Do you need assistance with eating?: No Do you have difficulty maintaining continence: No Do you need assistance with getting out of bed/getting out of a chair/moving?: No Do you have difficulty managing or taking your medications?: No  Follow up appointments reviewed: PCP Follow-up appointment confirmed?: Yes Date of PCP follow-up appointment?: 04/22/23 Follow-up Provider: [facility] Follow-up appointment confirmed?: NA Do you need transportation to your follow-up appointment?: No Do you understand care options if your condition(s) worsen?: Yes-patient verbalized understanding    SIGNATURE Karena Addison, LPN [facility] Nurse Health Advisor Direct Dial [phone]

## 2023-04-21 NOTE — Progress Notes (Deleted)
Established Patient Office Visit  Subjective:  Patient ID: Melvin Knight, male    DOB: 1980-12-03  Age: 42 y.o. MRN: [id number]  CC:  No chief complaint on file.   HPI Melvin Knight presents for hospital follow up.   Discharge Date: 04/20/23 Diagnosis: alcohol withdrawal with DT - patient left AMA Procedures/tests: platelets 98, CT head negative  Consultants: None New medications: None Discontinued medications: *** Discharge instructions:  check CBC, CMP and chest x-ray Status: {Blank multiple:19196::"better","worse","stable","fluctuating"}   Hypertension: -Medications: Nothing currently, had been on Benazepril-HCTZ 20-12.5 mg, Metoprolol 25 mg daily - per patient once he stopped drinking he was becoming light headed in rehab, so all of his blood pressure medications were discontinued. States he is now taking one of them "every once in awhile". Did not take any this morning.  -Denies any SOB, CP, vision changes, LE edema but does occasionally get lightheaded.   Elevated LFTs/Alcohol Use Disorder: -Liver function 12/22/22 AST 96, ALT 73 -Sober for 10 weeks now, just completed in-patient rehab program  -He is currently on a multivitamin and B complex vitamin, as well as folate and thiamine -Tremors have improved    Stress/MDD: -Mood status: Stable -Current treatment: had been on Lexapro 20 mg daily but no longer taking. Does sometimes have anxiety associated with heights although this has improved.   Past Medical History:  Diagnosis Date   AKI (acute kidney injury) (HCC) 09/24/2022   Anxiety    Hypertension    Hypokalemia    Hypokalemia 09/24/2022   Hypomagnesemia 09/24/2022    No past surgical history on file.  Family History  Problem Relation Age of Onset   Hypertension Father    Stroke Father    Cancer Sister     Social History   Socioeconomic History   Marital status: Single    Spouse name: Not on file   Number of children: Not on file   Years of  education: Not on file   Highest education level: Bachelor's degree (e.g., BA, AB, BS)  Occupational History   Not on file  Tobacco Use   Smoking status: Every Day    Packs/day: 1    Types: Cigarettes   Smokeless tobacco: Never  Vaping Use   Vaping Use: Never used  Substance and Sexual Activity   Alcohol use: Yes    Alcohol/week: 6.0 standard drinks of alcohol    Types: 6 Shots of liquor per week    Comment: 6 cocktails per day   Drug use: Not Currently   Sexual activity: Not Currently  Other Topics Concern   Not on file  Social History Narrative   Right handed    Social Determinants of Health   Financial Resource Strain: Low Risk  (03/16/2023)   Overall Financial Resource Strain (CARDIA)    Difficulty of Paying Living Expenses: Not hard at all  Food Insecurity: No Food Insecurity (04/19/2023)   Hunger Vital Sign    Worried About Running Out of Food in the Last Year: Never true    Ran Out of Food in the Last Year: Never true  Transportation Needs: No Transportation Needs (04/19/2023)   PRAPARE - Administrator, Civil Service (Medical): No    Lack of Transportation (Non-Medical): No  Physical Activity: Sufficiently Active (03/16/2023)   Exercise Vital Sign    Days of Exercise per Week: 3 days    Minutes of Exercise per Session: 150+ min  Stress: No Stress Concern Present (03/16/2023)   [employer] of  Occupational Health - Occupational Stress Questionnaire    Feeling of Stress : Not at all  Social Connections: Unknown (03/16/2023)   Social Connection and Isolation Panel [NHANES]    Frequency of Communication with Friends and Family: More than three times a week    Frequency of Social Gatherings with Friends and Family: Twice a week    Attends Religious Services: Not on Marketing executive or Organizations: No    Attends Banker Meetings: Not on file    Marital Status: Never married  Intimate Partner Violence: Not At Risk (04/19/2023)    Humiliation, Afraid, Rape, and Kick questionnaire    Fear of Current or Ex-Partner: No    Emotionally Abused: No    Physically Abused: No    Sexually Abused: No    Outpatient Medications Prior to Visit  Medication Sig Dispense Refill   folic acid (FOLVITE) 1 MG tablet Take 1 tablet (1 mg total) by mouth daily. (Patient not taking: Reported on 04/18/2023) 30 tablet 2   hydrocortisone 1 % ointment Apply 1 Application topically 2 (two) times daily. (Patient not taking: Reported on 04/18/2023) 30 g 0   hydrOXYzine (ATARAX) 10 MG tablet Take 1 tablet (10 mg total) by mouth 3 (three) times daily as needed for anxiety. (Patient not taking: Reported on 04/18/2023) 30 tablet 0   Multiple Vitamins-Minerals (MULTIVITAMIN WITH MINERALS) tablet Take 1 tablet by mouth daily. (Patient not taking: Reported on 04/18/2023) 90 tablet 3   thiamine (VITAMIN B-1) 100 MG tablet Take 1 tablet (100 mg total) by mouth daily. (Patient not taking: Reported on 04/18/2023) 30 tablet 2   traZODone (DESYREL) 100 MG tablet Take 100 mg by mouth at bedtime. (Patient not taking: Reported on 04/18/2023)     vitamin B-12 (CYANOCOBALAMIN) 100 MCG tablet Take 100 mcg by mouth daily. (Patient not taking: Reported on 04/18/2023)     No facility-administered medications prior to visit.    No Known Allergies  ROS Review of Systems  Constitutional:  Negative for chills and fever.  Eyes:  Negative for visual disturbance.  Respiratory:  Negative for cough and shortness of breath.   Cardiovascular:  Negative for chest pain and palpitations.  Neurological:  Positive for light-headedness. Negative for dizziness, tremors, weakness, numbness and headaches.  Psychiatric/Behavioral:  The patient is not nervous/anxious.       Objective:    Physical Exam Constitutional:      Appearance: Normal appearance.  HENT:     Head: Normocephalic and atraumatic.  Eyes:     Conjunctiva/sclera: Conjunctivae normal.  Cardiovascular:     Rate and  Rhythm: Normal rate and regular rhythm.  Pulmonary:     Effort: Pulmonary effort is normal.     Breath sounds: Normal breath sounds.  Skin:    General: Skin is warm and dry.  Neurological:     General: No focal deficit present.     Mental Status: He is alert. Mental status is at baseline.  Psychiatric:        Mood and Affect: Mood and affect normal.        Behavior: Behavior normal.     There were no vitals taken for this visit. Wt Readings from Last 3 Encounters:  04/18/23 195 lb (88.5 kg)  03/17/23 196 lb 12.8 oz (89.3 kg)  02/04/23 195 lb 12.8 oz (88.8 kg)     Health Maintenance Due  Topic Date Due   COVID-19 Vaccine (2 - 2023-24 season) 07/23/2022  There are no preventive care reminders to display for this patient.  Lab Results  Component Value Date   TSH 1.562 04/18/2023   Lab Results  Component Value Date   WBC 4.0 04/20/2023   HGB 14.9 04/20/2023   HCT 42.0 04/20/2023   MCV 95.9 04/20/2023   PLT 98 (L) 04/20/2023   Lab Results  Component Value Date   NA 138 04/20/2023   K 3.6 04/20/2023   CO2 22 04/20/2023   GLUCOSE 97 04/20/2023   BUN 8 04/20/2023   CREATININE 0.79 04/20/2023   BILITOT 1.5 (H) 04/18/2023   ALKPHOS 92 04/18/2023   AST 98 (H) 04/18/2023   ALT 61 (H) 04/18/2023   PROT 8.1 04/18/2023   ALBUMIN 4.9 04/18/2023   CALCIUM 9.3 04/20/2023   ANIONGAP 12 04/20/2023   EGFR 15 (L) 11/05/2022   Lab Results  Component Value Date   CHOL 258 (H) 01/11/2022   Lab Results  Component Value Date   HDL 61 01/11/2022   Lab Results  Component Value Date   LDLCALC 166 (H) 01/11/2022   Lab Results  Component Value Date   TRIG 162 (H) 01/11/2022   Lab Results  Component Value Date   CHOLHDL 4.2 01/11/2022   Lab Results  Component Value Date   HGBA1C 5.7 (H) 02/08/2022      Assessment & Plan:   1. Anxiety: Improved. Discontinue Lexapro, add back Hydroxyzine as needed.  - hydrOXYzine (ATARAX) 10 MG tablet; Take 1 tablet  (10 mg total) by mouth 3 (three) times daily as needed for anxiety.  Dispense: 30 tablet; Refill: 0  2. Primary hypertension: Blood pressure good here today, discontinue all blood pressure medications and continue to monitor. Follow up in 3 months to recheck.  3. Alcohol use disorder, moderate, dependence: Improved, remains sober. Continue to monitor. Remains on vitamins.    Follow-up: No follow-ups on file.    Margarita Mail, DO

## 2023-04-22 ENCOUNTER — Inpatient Hospital Stay: Payer: BC Managed Care – PPO | Admitting: Internal Medicine

## 2023-04-22 ENCOUNTER — Telehealth: Payer: BC Managed Care – PPO | Admitting: Internal Medicine

## 2023-04-22 NOTE — Progress Notes (Deleted)
 Established Patient Office Visit  Subjective:  Patient ID: Melvin Knight, male    DOB: 04/12/1981  Age: 42 y.o. MRN: [id number]  CC:  No chief complaint on file.   HPI Melvin Knight presents for hospital follow up.   Discharge Date: 04/20/23 Diagnosis: alcohol withdrawal with DT - patient left AMA Procedures/tests: platelets 98, CT head negative  Consultants: None New medications: None Discontinued medications: *** Discharge instructions:  check CBC, CMP and chest x-ray Status: {Blank multiple:19196::"better","worse","stable","fluctuating"}   Hypertension: -Medications: Nothing currently, had been on Benazepril-HCTZ 20-12.5 mg, Metoprolol 25 mg daily - per patient once he stopped drinking he was becoming light headed in rehab, so all of his blood pressure medications were discontinued. States he is now taking one of them "every once in awhile". Did not take any this morning.  -Denies any SOB, CP, vision changes, LE edema but does occasionally get lightheaded.   Elevated LFTs/Alcohol Use Disorder: -Liver function 12/22/22 AST 96, ALT 73 -Sober for 10 weeks now, just completed in-patient rehab program  -He is currently on a multivitamin and B complex vitamin, as well as folate and thiamine -Tremors have improved    Stress/MDD: -Mood status: Stable -Current treatment: had been on Lexapro 20 mg daily but no longer taking. Does sometimes have anxiety associated with heights although this has improved.   Past Medical History:  Diagnosis Date   AKI (acute kidney injury) (HCC) 09/24/2022   Anxiety    Hypertension    Hypokalemia    Hypokalemia 09/24/2022   Hypomagnesemia 09/24/2022    No past surgical history on file.  Family History  Problem Relation Age of Onset   Hypertension Father    Stroke Father    Cancer Sister     Social History   Socioeconomic History   Marital status: Single    Spouse name: Not on file   Number of children: Not on file   Years of  education: Not on file   Highest education level: Bachelor's degree (e.g., BA, AB, BS)  Occupational History   Not on file  Tobacco Use   Smoking status: Every Day    Packs/day: 1    Types: Cigarettes   Smokeless tobacco: Never  Vaping Use   Vaping Use: Never used  Substance and Sexual Activity   Alcohol use: Yes    Alcohol/week: 6.0 standard drinks of alcohol    Types: 6 Shots of liquor per week    Comment: 6 cocktails per day   Drug use: Not Currently   Sexual activity: Not Currently  Other Topics Concern   Not on file  Social History Narrative   Right handed    Social Determinants of Health   Financial Resource Strain: Low Risk  (03/16/2023)   Overall Financial Resource Strain (CARDIA)    Difficulty of Paying Living Expenses: Not hard at all  Food Insecurity: No Food Insecurity (04/19/2023)   Hunger Vital Sign    Worried About Running Out of Food in the Last Year: Never true    Ran Out of Food in the Last Year: Never true  Transportation Needs: No Transportation Needs (04/19/2023)   PRAPARE - Transportation    Lack of Transportation (Medical): No    Lack of Transportation (Non-Medical): No  Physical Activity: Sufficiently Active (03/16/2023)   Exercise Vital Sign    Days of Exercise per Week: 3 days    Minutes of Exercise per Session: 150+ min  Stress: No Stress Concern Present (03/16/2023)   [employer]   Occupational Health - Occupational Stress Questionnaire    Feeling of Stress : Not at all  Social Connections: Unknown (03/16/2023)   Social Connection and Isolation Panel [NHANES]    Frequency of Communication with Friends and Family: More than three times a week    Frequency of Social Gatherings with Friends and Family: Twice a week    Attends Religious Services: Not on file    Active Member of Clubs or Organizations: No    Attends Club or Organization Meetings: Not on file    Marital Status: Never married  Intimate Partner Violence: Not At Risk (04/19/2023)    Humiliation, Afraid, Rape, and Kick questionnaire    Fear of Current or Ex-Partner: No    Emotionally Abused: No    Physically Abused: No    Sexually Abused: No    Outpatient Medications Prior to Visit  Medication Sig Dispense Refill   folic acid (FOLVITE) 1 MG tablet Take 1 tablet (1 mg total) by mouth daily. (Patient not taking: Reported on 04/18/2023) 30 tablet 2   hydrocortisone 1 % ointment Apply 1 Application topically 2 (two) times daily. (Patient not taking: Reported on 04/18/2023) 30 g 0   hydrOXYzine (ATARAX) 10 MG tablet Take 1 tablet (10 mg total) by mouth 3 (three) times daily as needed for anxiety. (Patient not taking: Reported on 04/18/2023) 30 tablet 0   Multiple Vitamins-Minerals (MULTIVITAMIN WITH MINERALS) tablet Take 1 tablet by mouth daily. (Patient not taking: Reported on 04/18/2023) 90 tablet 3   thiamine (VITAMIN B-1) 100 MG tablet Take 1 tablet (100 mg total) by mouth daily. (Patient not taking: Reported on 04/18/2023) 30 tablet 2   traZODone (DESYREL) 100 MG tablet Take 100 mg by mouth at bedtime. (Patient not taking: Reported on 04/18/2023)     vitamin B-12 (CYANOCOBALAMIN) 100 MCG tablet Take 100 mcg by mouth daily. (Patient not taking: Reported on 04/18/2023)     No facility-administered medications prior to visit.    No Known Allergies  ROS Review of Systems  Constitutional:  Negative for chills and fever.  Eyes:  Negative for visual disturbance.  Respiratory:  Negative for cough and shortness of breath.   Cardiovascular:  Negative for chest pain and palpitations.  Neurological:  Positive for light-headedness. Negative for dizziness, tremors, weakness, numbness and headaches.  Psychiatric/Behavioral:  The patient is not nervous/anxious.       Objective:    Physical Exam Constitutional:      Appearance: Normal appearance.  HENT:     Head: Normocephalic and atraumatic.  Eyes:     Conjunctiva/sclera: Conjunctivae normal.  Cardiovascular:     Rate and  Rhythm: Normal rate and regular rhythm.  Pulmonary:     Effort: Pulmonary effort is normal.     Breath sounds: Normal breath sounds.  Skin:    General: Skin is warm and dry.  Neurological:     General: No focal deficit present.     Mental Status: He is alert. Mental status is at baseline.  Psychiatric:        Mood and Affect: Mood and affect normal.        Behavior: Behavior normal.     There were no vitals taken for this visit. Wt Readings from Last 3 Encounters:  04/18/23 195 lb (88.5 kg)  03/17/23 196 lb 12.8 oz (89.3 kg)  02/04/23 195 lb 12.8 oz (88.8 kg)     Health Maintenance Due  Topic Date Due   COVID-19 Vaccine (2 - 2023-24 season) 07/23/2022          There are no preventive care reminders to display for this patient.  Lab Results  Component Value Date   TSH 1.562 04/18/2023   Lab Results  Component Value Date   WBC 4.0 04/20/2023   HGB 14.9 04/20/2023   HCT 42.0 04/20/2023   MCV 95.9 04/20/2023   PLT 98 (L) 04/20/2023   Lab Results  Component Value Date   NA 138 04/20/2023   K 3.6 04/20/2023   CO2 22 04/20/2023   GLUCOSE 97 04/20/2023   BUN 8 04/20/2023   CREATININE 0.79 04/20/2023   BILITOT 1.5 (H) 04/18/2023   ALKPHOS 92 04/18/2023   AST 98 (H) 04/18/2023   ALT 61 (H) 04/18/2023   PROT 8.1 04/18/2023   ALBUMIN 4.9 04/18/2023   CALCIUM 9.3 04/20/2023   ANIONGAP 12 04/20/2023   EGFR 15 (L) 11/05/2022   Lab Results  Component Value Date   CHOL 258 (H) 01/11/2022   Lab Results  Component Value Date   HDL 61 01/11/2022   Lab Results  Component Value Date   LDLCALC 166 (H) 01/11/2022   Lab Results  Component Value Date   TRIG 162 (H) 01/11/2022   Lab Results  Component Value Date   CHOLHDL 4.2 01/11/2022   Lab Results  Component Value Date   HGBA1C 5.7 (H) 02/08/2022      Assessment & Plan:   1. Anxiety: Improved. Discontinue Lexapro, add back Hydroxyzine as needed.  - hydrOXYzine (ATARAX) 10 MG tablet; Take 1 tablet  (10 mg total) by mouth 3 (three) times daily as needed for anxiety.  Dispense: 30 tablet; Refill: 0  2. Primary hypertension: Blood pressure good here today, discontinue all blood pressure medications and continue to monitor. Follow up in 3 months to recheck.  3. Alcohol use disorder, moderate, dependence: Improved, remains sober. Continue to monitor. Remains on vitamins.    Follow-up: No follow-ups on file.    Keatyn Luck, DO 

## 2023-04-26 NOTE — Progress Notes (Unsigned)
Established Patient Office Visit  Subjective:  Patient ID: Melvin Knight, male    DOB: 26-May-1981  Age: 42 y.o. MRN: [id number]  CC:  No chief complaint on file.   HPI Melvin Knight presents for hospital follow up.   Discharge Date: 04/20/23 Diagnosis: alcohol withdrawal with DT - patient left AMA Procedures/tests: platelets 98, CT head negative  Consultants: None New medications: None Discontinued medications: *** Discharge instructions:  check CBC, CMP and chest x-ray Status: {Blank multiple:19196::"better","worse","stable","fluctuating"}   Hypertension: -Medications: Nothing currently, had been on Benazepril-HCTZ 20-12.5 mg, Metoprolol 25 mg daily - per patient once he stopped drinking he was becoming light headed in rehab, so all of his blood pressure medications were discontinued. States he is now taking one of them "every once in awhile". Did not take any this morning.  -Denies any SOB, CP, vision changes, LE edema but does occasionally get lightheaded.   Elevated LFTs/Alcohol Use Disorder: -Liver function 12/22/22 AST 96, ALT 73 -Sober for 10 weeks now, just completed in-patient rehab program  -He is currently on a multivitamin and B complex vitamin, as well as folate and thiamine -Tremors have improved    Stress/MDD: -Mood status: Stable -Current treatment: had been on Lexapro 20 mg daily but no longer taking. Does sometimes have anxiety associated with heights although this has improved.   Past Medical History:  Diagnosis Date   AKI (acute kidney injury) (HCC) 09/24/2022   Anxiety    Hypertension    Hypokalemia    Hypokalemia 09/24/2022   Hypomagnesemia 09/24/2022    No past surgical history on file.  Family History  Problem Relation Age of Onset   Hypertension Father    Stroke Father    Cancer Sister     Social History   Socioeconomic History   Marital status: Single    Spouse name: Not on file   Number of children: Not on file   Years of  education: Not on file   Highest education level: Bachelor's degree (e.g., BA, AB, BS)  Occupational History   Not on file  Tobacco Use   Smoking status: Every Day    Packs/day: 1    Types: Cigarettes   Smokeless tobacco: Never  Vaping Use   Vaping Use: Never used  Substance and Sexual Activity   Alcohol use: Yes    Alcohol/week: 6.0 standard drinks of alcohol    Types: 6 Shots of liquor per week    Comment: 6 cocktails per day   Drug use: Not Currently   Sexual activity: Not Currently  Other Topics Concern   Not on file  Social History Narrative   Right handed    Social Determinants of Health   Financial Resource Strain: Low Risk  (03/16/2023)   Overall Financial Resource Strain (CARDIA)    Difficulty of Paying Living Expenses: Not hard at all  Food Insecurity: No Food Insecurity (04/19/2023)   Hunger Vital Sign    Worried About Running Out of Food in the Last Year: Never true    Ran Out of Food in the Last Year: Never true  Transportation Needs: No Transportation Needs (04/19/2023)   PRAPARE - Administrator, Civil Service (Medical): No    Lack of Transportation (Non-Medical): No  Physical Activity: Sufficiently Active (03/16/2023)   Exercise Vital Sign    Days of Exercise per Week: 3 days    Minutes of Exercise per Session: 150+ min  Stress: No Stress Concern Present (03/16/2023)   [employer] of  Occupational Health - Occupational Stress Questionnaire    Feeling of Stress : Not at all  Social Connections: Unknown (03/16/2023)   Social Connection and Isolation Panel [NHANES]    Frequency of Communication with Friends and Family: More than three times a week    Frequency of Social Gatherings with Friends and Family: Twice a week    Attends Religious Services: Not on Marketing executive or Organizations: No    Attends Banker Meetings: Not on file    Marital Status: Never married  Intimate Partner Violence: Not At Risk (04/19/2023)    Humiliation, Afraid, Rape, and Kick questionnaire    Fear of Current or Ex-Partner: No    Emotionally Abused: No    Physically Abused: No    Sexually Abused: No    Outpatient Medications Prior to Visit  Medication Sig Dispense Refill   folic acid (FOLVITE) 1 MG tablet Take 1 tablet (1 mg total) by mouth daily. (Patient not taking: Reported on 04/18/2023) 30 tablet 2   hydrocortisone 1 % ointment Apply 1 Application topically 2 (two) times daily. (Patient not taking: Reported on 04/18/2023) 30 g 0   hydrOXYzine (ATARAX) 10 MG tablet Take 1 tablet (10 mg total) by mouth 3 (three) times daily as needed for anxiety. (Patient not taking: Reported on 04/18/2023) 30 tablet 0   Multiple Vitamins-Minerals (MULTIVITAMIN WITH MINERALS) tablet Take 1 tablet by mouth daily. (Patient not taking: Reported on 04/18/2023) 90 tablet 3   thiamine (VITAMIN B-1) 100 MG tablet Take 1 tablet (100 mg total) by mouth daily. (Patient not taking: Reported on 04/18/2023) 30 tablet 2   traZODone (DESYREL) 100 MG tablet Take 100 mg by mouth at bedtime. (Patient not taking: Reported on 04/18/2023)     vitamin B-12 (CYANOCOBALAMIN) 100 MCG tablet Take 100 mcg by mouth daily. (Patient not taking: Reported on 04/18/2023)     No facility-administered medications prior to visit.    No Known Allergies  ROS Review of Systems  Constitutional:  Negative for chills and fever.  Eyes:  Negative for visual disturbance.  Respiratory:  Negative for cough and shortness of breath.   Cardiovascular:  Negative for chest pain and palpitations.  Neurological:  Positive for light-headedness. Negative for dizziness, tremors, weakness, numbness and headaches.  Psychiatric/Behavioral:  The patient is not nervous/anxious.       Objective:    Physical Exam Constitutional:      Appearance: Normal appearance.  HENT:     Head: Normocephalic and atraumatic.  Eyes:     Conjunctiva/sclera: Conjunctivae normal.  Cardiovascular:     Rate and  Rhythm: Normal rate and regular rhythm.  Pulmonary:     Effort: Pulmonary effort is normal.     Breath sounds: Normal breath sounds.  Skin:    General: Skin is warm and dry.  Neurological:     General: No focal deficit present.     Mental Status: He is alert. Mental status is at baseline.  Psychiatric:        Mood and Affect: Mood and affect normal.        Behavior: Behavior normal.     There were no vitals taken for this visit. Wt Readings from Last 3 Encounters:  04/18/23 195 lb (88.5 kg)  03/17/23 196 lb 12.8 oz (89.3 kg)  02/04/23 195 lb 12.8 oz (88.8 kg)     Health Maintenance Due  Topic Date Due   COVID-19 Vaccine (2 - 2023-24 season) 07/23/2022  There are no preventive care reminders to display for this patient.  Lab Results  Component Value Date   TSH 1.562 04/18/2023   Lab Results  Component Value Date   WBC 4.0 04/20/2023   HGB 14.9 04/20/2023   HCT 42.0 04/20/2023   MCV 95.9 04/20/2023   PLT 98 (L) 04/20/2023   Lab Results  Component Value Date   NA 138 04/20/2023   K 3.6 04/20/2023   CO2 22 04/20/2023   GLUCOSE 97 04/20/2023   BUN 8 04/20/2023   CREATININE 0.79 04/20/2023   BILITOT 1.5 (H) 04/18/2023   ALKPHOS 92 04/18/2023   AST 98 (H) 04/18/2023   ALT 61 (H) 04/18/2023   PROT 8.1 04/18/2023   ALBUMIN 4.9 04/18/2023   CALCIUM 9.3 04/20/2023   ANIONGAP 12 04/20/2023   EGFR 15 (L) 11/05/2022   Lab Results  Component Value Date   CHOL 258 (H) 01/11/2022   Lab Results  Component Value Date   HDL 61 01/11/2022   Lab Results  Component Value Date   LDLCALC 166 (H) 01/11/2022   Lab Results  Component Value Date   TRIG 162 (H) 01/11/2022   Lab Results  Component Value Date   CHOLHDL 4.2 01/11/2022   Lab Results  Component Value Date   HGBA1C 5.7 (H) 02/08/2022      Assessment & Plan:   1. Anxiety: Improved. Discontinue Lexapro, add back Hydroxyzine as needed.  - hydrOXYzine (ATARAX) 10 MG tablet; Take 1 tablet  (10 mg total) by mouth 3 (three) times daily as needed for anxiety.  Dispense: 30 tablet; Refill: 0  2. Primary hypertension: Blood pressure good here today, discontinue all blood pressure medications and continue to monitor. Follow up in 3 months to recheck.  3. Alcohol use disorder, moderate, dependence: Improved, remains sober. Continue to monitor. Remains on vitamins.    Follow-up: No follow-ups on file.    Margarita Mail, DO

## 2023-04-27 ENCOUNTER — Encounter: Payer: Self-pay | Admitting: Internal Medicine

## 2023-04-27 ENCOUNTER — Ambulatory Visit (INDEPENDENT_AMBULATORY_CARE_PROVIDER_SITE_OTHER): Payer: BC Managed Care – PPO | Admitting: Internal Medicine

## 2023-04-27 VITALS — BP 158/102 | HR 124 | Temp 98.2°F | Resp 18 | Ht 67.0 in | Wt 191.7 lb

## 2023-04-27 DIAGNOSIS — Z09 Encounter for follow-up examination after completed treatment for conditions other than malignant neoplasm: Secondary | ICD-10-CM | POA: Diagnosis not present

## 2023-04-27 DIAGNOSIS — F1020 Alcohol dependence, uncomplicated: Secondary | ICD-10-CM | POA: Diagnosis not present

## 2023-04-27 DIAGNOSIS — E876 Hypokalemia: Secondary | ICD-10-CM

## 2023-04-27 DIAGNOSIS — I10 Essential (primary) hypertension: Secondary | ICD-10-CM | POA: Diagnosis not present

## 2023-04-27 LAB — CBC WITH DIFFERENTIAL/PLATELET
Absolute Monocytes: 505 cells/uL (ref 200–950)
Basophils Absolute: 87 cells/uL (ref 0–200)
Basophils Relative: 2.3 %
Eosinophils Absolute: 99 cells/uL (ref 15–500)
Eosinophils Relative: 2.6 %
HCT: 44.3 % (ref 38.5–50.0)
Hemoglobin: 15.8 g/dL (ref 13.2–17.1)
Lymphs Abs: 1231 cells/uL (ref 850–3900)
MCH: 34.1 pg — ABNORMAL HIGH (ref 27.0–33.0)
MCHC: 35.7 g/dL (ref 32.0–36.0)
MCV: 95.5 fL (ref 80.0–100.0)
MPV: 9.8 fL (ref 7.5–12.5)
Monocytes Relative: 13.3 %
Neutro Abs: 1877 cells/uL (ref 1500–7800)
Neutrophils Relative %: 49.4 %
Platelets: 200 10*3/uL (ref 140–400)
RBC: 4.64 10*6/uL (ref 4.20–5.80)
RDW: 13 % (ref 11.0–15.0)
Total Lymphocyte: 32.4 %
WBC: 3.8 10*3/uL (ref 3.8–10.8)

## 2023-04-27 LAB — COMPLETE METABOLIC PANEL WITH GFR
AG Ratio: 1.6 (calc) (ref 1.0–2.5)
ALT: 73 U/L — ABNORMAL HIGH (ref 9–46)
AST: 106 U/L — ABNORMAL HIGH (ref 10–40)
Albumin: 4.6 g/dL (ref 3.6–5.1)
Alkaline phosphatase (APISO): 104 U/L (ref 36–130)
BUN: 15 mg/dL (ref 7–25)
CO2: 27 mmol/L (ref 20–32)
Calcium: 9.7 mg/dL (ref 8.6–10.3)
Chloride: 100 mmol/L (ref 98–110)
Creat: 0.89 mg/dL (ref 0.60–1.26)
Globulin: 2.9 g/dL (calc) (ref 1.9–3.7)
Glucose, Bld: 126 mg/dL — ABNORMAL HIGH (ref 65–99)
Potassium: 3.1 mmol/L — ABNORMAL LOW (ref 3.5–5.3)
Sodium: 139 mmol/L (ref 135–146)
Total Bilirubin: 1 mg/dL (ref 0.2–1.2)
Total Protein: 7.5 g/dL (ref 6.1–8.1)
eGFR: 112 mL/min/{1.73_m2} (ref >=60)

## 2023-04-27 NOTE — Patient Instructions (Signed)
Naltrexone; Bupropion Extended-Release Tablets What is this medication? NALTREXONE; BUPROPION (nal TREX one; byoo PROE pee on) promotes weight loss. It may also be used to maintain weight loss. It works by decreasing appetite. Changes to diet and exercise are often combined with this medication. This medicine may be used for other purposes; ask your health care provider or pharmacist if you have questions. COMMON BRAND NAME(S): Contrave What should I tell my care team before I take this medication? They need to know if you have any of these conditions: An eating disorder, such as anorexia or bulimia Diabetes Depression Frequently drink alcohol Glaucoma Head injury Heart disease High blood pressure History of a tumor or infection of your brain or spine History of heart attack or stroke History of irregular heartbeat History of substance use disorder or alcohol use disorder Kidney disease Liver disease Low levels of sodium in the blood Mental health condition Seizures Suicidal thoughts, plans, or attempt by you or a family member Taken an MAOI like Carbex, Eldepryl, Marplan, Nardil, or Parnate in last 14 days An unusual or allergic reaction to bupropion, naltrexone, other medications, foods, dyes, or preservatives Breast-feeding Pregnant or trying to become pregnant How should I use this medication? Take this medication by mouth with a full glass of water. Take it as directed on the prescription label at the same time every day. Do not cut, crush, or chew this medication. Swallow the tablets whole. You can take it with or without food. Do not take with high-fat meals as this may increase your risk of seizures. Keep taking it unless your care team tells you to stop. A special MedGuide will be given to you by the pharmacist with each prescription and refill. Be sure to read this information carefully each time. Talk to your care team about the use of this medication in children. Special  care may be needed. Overdosage: If you think you have taken too much of this medicine contact a poison control center or emergency room at once. NOTE: This medicine is only for you. Do not share this medicine with others. What if I miss a dose? If you miss a dose, skip it. Take your next dose at the normal time. Do not take extra or 2 doses at the same time to make up for the missed dose. What may interact with this medication? Do not take this medication with any of the following: Any medications used to stop taking opioids, such as methadone or buprenorphine Linezolid MAOIs, such as Carbex, Eldepryl, Marplan, Nardil, and Parnate Methylene blue (injected into a vein) Opioid medications Other medications that contain bupropion, such as Zyban or Wellbutrin This medication may also interact with the following: Alcohol Certain medications for blood pressure, such as metoprolol, propranolol Certain medications for depression, anxiety, or mental health conditions Certain medications for HIV or hepatitis Certain medications for irregular heart beat, such as propafenone, flecainide Certain medications for Parkinson disease, such as amantadine, levodopa Certain medications for seizures, such as carbamazepine, phenytoin, phenobarbital Certain medications for sleep Cimetidine Clopidogrel Cyclophosphamide Digoxin Disulfiram Furazolidone Isoniazid Nicotine Orphenadrine Procarbazine Steroid medications, such as prednisone or cortisone Stimulant medications for attention disorders, weight loss, or to stay awake Tamoxifen Theophylline Thiotepa Ticlopidine Tramadol Warfarin This list may not describe all possible interactions. Give your health care provider a list of all the medicines, herbs, non-prescription drugs, or dietary supplements you use. Also tell them if you smoke, drink alcohol, or use illegal drugs. Some items may interact with your medicine. What   should I watch for while using  this medication? Visit your care team for regular checks on your progress. This medication may cause serious skin reactions. They can happen weeks to months after starting the medication. Contact your care team right away if you notice fevers or flu-like symptoms with a rash. The rash may be red or purple and then turn into blisters or peeling of the skin. Or, you might notice a red rash with swelling of the face, lips, or lymph nodes in your neck or under your arms. This medication may affect blood sugar. Ask your care team if changes in diet or medications are needed if you have diabetes. Patients and their families should watch out for new or worsening depression or thoughts of suicide. This includes sudden changes in mood, behaviors, or thoughts. These changes can happen at any time but are more common in the beginning of treatment or after a change in dose. Call your care team right away if you experience these thoughts or worsening depression. Avoid alcoholic drinks while taking this medication. Drinking large amounts of alcoholic beverages, using sleeping or anxiety medications, or quickly stopping the use of these agents while taking this medication may increase your risk for a seizure. Do not drive or use heavy machinery until you know how this medication affects you. This medication can impair your ability to perform these tasks. Inform your care team if you wish to become pregnant or think you might be pregnant. Losing weight while pregnant is not advised and may cause harm to the unborn child. Talk to your care team for more information. What side effects may I notice from receiving this medication? Side effects that you should report to your care team as soon as possible: Allergic reactions--skin rash, itching, hives, swelling of the face, lips, tongue, or throat Fast or irregular heartbeat Increase in blood pressure Liver injury--right upper belly pain, loss of appetite, nausea,  light-colored stool, dark yellow or brown urine, yellowing skin or eyes, unusual weakness or fatigue Mood and behavior changes--anxiety, nervousness, confusion, hallucinations, irritability, hostility, thoughts of suicide or self-harm, worsening mood, feelings of depression Redness, blistering, peeling, or loosening of the skin, including inside the mouth Seizures Sudden eye pain or change in vision such as blurry vision, seeing halos around lights, vision loss Side effects that usually do not require medical attention (report to your care team if they continue or are bothersome): Constipation Dizziness Dry mouth Fatigue Headache Nausea Trouble sleeping Vomiting This list may not describe all possible side effects. Call your doctor for medical advice about side effects. You may report side effects to FDA at 1-800-FDA-1088. Where should I keep my medication? Keep out of the reach of children and pets. Store between 15 and 30 degrees C (59 and 86 degrees F). Get rid of any unused medication after the expiration date. To get rid of medications that are no longer needed or have expired: Take the medication to a medication take-back program. Check with your pharmacy or law enforcement to find a location. If you cannot return the medication, check the label or package insert to see if the medication should be thrown out in the garbage or flushed down the toilet. If you are not sure, ask your care team. If it is safe to put it in the trash, pour the medication out of the container. Mix the medication with cat litter, dirt, coffee grounds, or other unwanted substance. Seal the mixture in a bag or container. Put it in   the trash. NOTE: This sheet is a summary. It may not cover all possible information. If you have questions about this medicine, talk to your doctor, pharmacist, or health care provider.  2024 Elsevier/Gold Standard (2022-01-04 00:00:00)  

## 2023-04-28 MED ORDER — POTASSIUM CHLORIDE CRYS ER 20 MEQ PO TBCR
20.0000 meq | EXTENDED_RELEASE_TABLET | Freq: Every day | ORAL | 0 refills | Status: DC
Start: 2023-04-28 — End: 2023-07-04

## 2023-04-28 NOTE — Addendum Note (Signed)
Addended by: Margarita Mail on: 04/28/2023 08:00 AM   Modules accepted: Orders

## 2023-05-09 NOTE — Progress Notes (Signed)
Please disregard

## 2023-05-16 ENCOUNTER — Inpatient Hospital Stay
Admission: EM | Admit: 2023-05-16 | Discharge: 2023-05-18 | DRG: 641 | Discharge status: Against Medical Advice | Payer: BC Managed Care – PPO | Attending: Internal Medicine | Admitting: Internal Medicine

## 2023-05-16 ENCOUNTER — Emergency Department: Payer: BC Managed Care – PPO

## 2023-05-16 ENCOUNTER — Other Ambulatory Visit: Payer: Self-pay

## 2023-05-16 DIAGNOSIS — R062 Wheezing: Secondary | ICD-10-CM | POA: Insufficient documentation

## 2023-05-16 DIAGNOSIS — I160 Hypertensive urgency: Secondary | ICD-10-CM | POA: Diagnosis present

## 2023-05-16 DIAGNOSIS — E876 Hypokalemia: Secondary | ICD-10-CM | POA: Diagnosis present

## 2023-05-16 DIAGNOSIS — Z5329 Procedure and treatment not carried out because of patient's decision for other reasons: Secondary | ICD-10-CM | POA: Diagnosis present

## 2023-05-16 DIAGNOSIS — Z1152 Encounter for screening for COVID-19: Secondary | ICD-10-CM

## 2023-05-16 DIAGNOSIS — F32A Depression, unspecified: Secondary | ICD-10-CM | POA: Diagnosis present

## 2023-05-16 DIAGNOSIS — K760 Fatty (change of) liver, not elsewhere classified: Secondary | ICD-10-CM | POA: Diagnosis present

## 2023-05-16 DIAGNOSIS — F17210 Nicotine dependence, cigarettes, uncomplicated: Secondary | ICD-10-CM | POA: Diagnosis present

## 2023-05-16 DIAGNOSIS — M109 Gout, unspecified: Secondary | ICD-10-CM | POA: Diagnosis present

## 2023-05-16 DIAGNOSIS — D696 Thrombocytopenia, unspecified: Secondary | ICD-10-CM | POA: Insufficient documentation

## 2023-05-16 DIAGNOSIS — F419 Anxiety disorder, unspecified: Secondary | ICD-10-CM | POA: Diagnosis present

## 2023-05-16 DIAGNOSIS — R262 Difficulty in walking, not elsewhere classified: Secondary | ICD-10-CM | POA: Diagnosis present

## 2023-05-16 DIAGNOSIS — D709 Neutropenia, unspecified: Secondary | ICD-10-CM | POA: Diagnosis present

## 2023-05-16 DIAGNOSIS — I10 Essential (primary) hypertension: Secondary | ICD-10-CM | POA: Diagnosis present

## 2023-05-16 DIAGNOSIS — Z8249 Family history of ischemic heart disease and other diseases of the circulatory system: Secondary | ICD-10-CM | POA: Diagnosis not present

## 2023-05-16 DIAGNOSIS — Z87898 Personal history of other specified conditions: Secondary | ICD-10-CM

## 2023-05-16 DIAGNOSIS — Z823 Family history of stroke: Secondary | ICD-10-CM

## 2023-05-16 DIAGNOSIS — Z79899 Other long term (current) drug therapy: Secondary | ICD-10-CM

## 2023-05-16 DIAGNOSIS — F10231 Alcohol dependence with withdrawal delirium: Secondary | ICD-10-CM | POA: Diagnosis present

## 2023-05-16 DIAGNOSIS — F10939 Alcohol use, unspecified with withdrawal, unspecified: Secondary | ICD-10-CM | POA: Diagnosis not present

## 2023-05-16 DIAGNOSIS — K709 Alcoholic liver disease, unspecified: Secondary | ICD-10-CM | POA: Insufficient documentation

## 2023-05-16 DIAGNOSIS — R7989 Other specified abnormal findings of blood chemistry: Secondary | ICD-10-CM | POA: Diagnosis present

## 2023-05-16 DIAGNOSIS — R569 Unspecified convulsions: Secondary | ICD-10-CM | POA: Diagnosis present

## 2023-05-16 DIAGNOSIS — R000 Tachycardia, unspecified: Secondary | ICD-10-CM | POA: Diagnosis present

## 2023-05-16 DIAGNOSIS — Z8659 Personal history of other mental and behavioral disorders: Secondary | ICD-10-CM

## 2023-05-16 DIAGNOSIS — M10071 Idiopathic gout, right ankle and foot: Secondary | ICD-10-CM | POA: Diagnosis not present

## 2023-05-16 DIAGNOSIS — F1020 Alcohol dependence, uncomplicated: Secondary | ICD-10-CM | POA: Diagnosis present

## 2023-05-16 LAB — COMPREHENSIVE METABOLIC PANEL
ALT: 62 U/L — ABNORMAL HIGH (ref 0–44)
AST: 80 U/L — ABNORMAL HIGH (ref 15–41)
Albumin: 3.5 g/dL (ref 3.5–5.0)
Alkaline Phosphatase: 91 U/L (ref 38–126)
Anion gap: 13 (ref 5–15)
BUN: 6 mg/dL (ref 6–20)
CO2: 22 mmol/L (ref 22–32)
Calcium: 7.9 mg/dL — ABNORMAL LOW (ref 8.9–10.3)
Chloride: 104 mmol/L (ref 98–111)
Creatinine, Ser: 0.71 mg/dL (ref 0.61–1.24)
GFR, Estimated: >60 mL/min (ref >60)
Glucose, Bld: 101 mg/dL — ABNORMAL HIGH (ref 70–99)
Potassium: 2.7 mmol/L — CL (ref 3.5–5.1)
Sodium: 139 mmol/L (ref 135–145)
Total Bilirubin: 1.6 mg/dL — ABNORMAL HIGH (ref 0.3–1.2)
Total Protein: 6.5 g/dL (ref 6.5–8.1)

## 2023-05-16 LAB — CBC WITH DIFFERENTIAL/PLATELET
Abs Immature Granulocytes: 0.01 10*3/uL (ref 0.00–0.07)
Basophils Absolute: 0 10*3/uL (ref 0.0–0.1)
Basophils Relative: 1 %
Eosinophils Absolute: 0 10*3/uL (ref 0.0–0.5)
Eosinophils Relative: 1 %
HCT: 36.9 % — ABNORMAL LOW (ref 39.0–52.0)
Hemoglobin: 13.5 g/dL (ref 13.0–17.0)
Immature Granulocytes: 0 %
Lymphocytes Relative: 25 %
Lymphs Abs: 0.9 10*3/uL (ref 0.7–4.0)
MCH: 34.2 pg — ABNORMAL HIGH (ref 26.0–34.0)
MCHC: 36.6 g/dL — ABNORMAL HIGH (ref 30.0–36.0)
MCV: 93.4 fL (ref 80.0–100.0)
Monocytes Absolute: 0.5 10*3/uL (ref 0.1–1.0)
Monocytes Relative: 13 %
Neutro Abs: 2.3 10*3/uL (ref 1.7–7.7)
Neutrophils Relative %: 60 %
Platelets: 124 10*3/uL — ABNORMAL LOW (ref 150–400)
RBC: 3.95 MIL/uL — ABNORMAL LOW (ref 4.22–5.81)
RDW: 11.9 % (ref 11.5–15.5)
WBC: 3.8 10*3/uL — ABNORMAL LOW (ref 4.0–10.5)
nRBC: 0 % (ref 0.0–0.2)

## 2023-05-16 LAB — SARS CORONAVIRUS 2 BY RT PCR: SARS Coronavirus 2 by RT PCR: NEGATIVE

## 2023-05-16 LAB — MAGNESIUM: Magnesium: 1 mg/dL — ABNORMAL LOW (ref 1.7–2.4)

## 2023-05-16 LAB — CK: Total CK: 148 U/L (ref 49–397)

## 2023-05-16 MED ORDER — ENOXAPARIN SODIUM 40 MG/0.4ML IJ SOSY
40.0000 mg | PREFILLED_SYRINGE | INTRAMUSCULAR | Status: DC
  Administered 2023-05-16 – 2023-05-17 (×2): 40 mg via SUBCUTANEOUS
  Filled 2023-05-16 (×2): qty 0.4

## 2023-05-16 MED ORDER — LORAZEPAM 2 MG PO TABS: 0.0000 mg | ORAL_TABLET | Freq: Two times a day (BID) | ORAL | Status: DC

## 2023-05-16 MED ORDER — THIAMINE HCL 100 MG/ML IJ SOLN: 100.0000 mg | Freq: Every day | INTRAMUSCULAR | Status: DC

## 2023-05-16 MED ORDER — CHLORDIAZEPOXIDE HCL 25 MG PO CAPS
25.0000 mg | ORAL_CAPSULE | Freq: Once | ORAL | Status: AC
  Administered 2023-05-16: 25 mg via ORAL
  Filled 2023-05-16: qty 1

## 2023-05-16 MED ORDER — LORAZEPAM 2 MG PO TABS
2.0000 mg | ORAL_TABLET | Freq: Four times a day (QID) | ORAL | Status: DC
  Administered 2023-05-16 – 2023-05-18 (×4): 2 mg via ORAL
  Filled 2023-05-16 (×3): qty 1
  Filled 2023-05-16: qty 2

## 2023-05-16 MED ORDER — LORAZEPAM 2 MG PO TABS: 0.0000 mg | ORAL_TABLET | Freq: Four times a day (QID) | ORAL | Status: DC

## 2023-05-16 MED ORDER — POTASSIUM CHLORIDE 20 MEQ PO PACK
60.0000 meq | PACK | Freq: Two times a day (BID) | ORAL | Status: DC
  Administered 2023-05-16 – 2023-05-17 (×2): 60 meq via ORAL
  Filled 2023-05-16 (×2): qty 3

## 2023-05-16 MED ORDER — THIAMINE HCL 100 MG/ML IJ SOLN
100.0000 mg | Freq: Every day | INTRAMUSCULAR | Status: DC
  Filled 2023-05-16: qty 2

## 2023-05-16 MED ORDER — ACETAMINOPHEN 325 MG PO TABS: 650.0000 mg | ORAL_TABLET | Freq: Four times a day (QID) | ORAL | Status: DC | PRN

## 2023-05-16 MED ORDER — LORAZEPAM 2 MG PO TABS: 2.0000 mg | ORAL_TABLET | Freq: Two times a day (BID) | ORAL | Status: DC

## 2023-05-16 MED ORDER — DEXTROSE-SODIUM CHLORIDE 5-0.9 % IV SOLN: INTRAVENOUS | Status: DC

## 2023-05-16 MED ORDER — SODIUM CHLORIDE 0.9 % IV BOLUS
1000.0000 mL | Freq: Once | INTRAVENOUS | Status: AC
  Administered 2023-05-16: 1000 mL via INTRAVENOUS

## 2023-05-16 MED ORDER — THIAMINE MONONITRATE 100 MG PO TABS: 100.0000 mg | ORAL_TABLET | Freq: Every day | ORAL | Status: DC

## 2023-05-16 MED ORDER — ONDANSETRON HCL 4 MG PO TABS: 4.0000 mg | ORAL_TABLET | Freq: Four times a day (QID) | ORAL | Status: DC | PRN

## 2023-05-16 MED ORDER — LORAZEPAM 1 MG PO TABS
1.0000 mg | ORAL_TABLET | ORAL | Status: DC | PRN
  Administered 2023-05-16: 1 mg via ORAL
  Administered 2023-05-17 (×2): 4 mg via ORAL
  Filled 2023-05-16: qty 2
  Filled 2023-05-16: qty 4
  Filled 2023-05-16: qty 1

## 2023-05-16 MED ORDER — ACETAMINOPHEN 650 MG RE SUPP: 650.0000 mg | Freq: Four times a day (QID) | RECTAL | Status: DC | PRN

## 2023-05-16 MED ORDER — ONDANSETRON HCL 4 MG/2ML IJ SOLN: 4.0000 mg | Freq: Four times a day (QID) | INTRAMUSCULAR | Status: DC | PRN

## 2023-05-16 MED ORDER — MAGNESIUM SULFATE 2 GM/50ML IV SOLN
2.0000 g | Freq: Once | INTRAVENOUS | Status: AC
  Administered 2023-05-16: 2 g via INTRAVENOUS
  Filled 2023-05-16: qty 50

## 2023-05-16 MED ORDER — FOLIC ACID 1 MG PO TABS
1.0000 mg | ORAL_TABLET | Freq: Every day | ORAL | Status: DC
  Administered 2023-05-17: 1 mg via ORAL
  Filled 2023-05-16: qty 1

## 2023-05-16 MED ORDER — ADULT MULTIVITAMIN W/MINERALS CH
1.0000 | ORAL_TABLET | Freq: Every day | ORAL | Status: DC
  Administered 2023-05-17: 1 via ORAL
  Filled 2023-05-16 (×2): qty 1

## 2023-05-16 MED ORDER — LORAZEPAM 1 MG PO TABS
1.0000 mg | ORAL_TABLET | ORAL | Status: DC | PRN
  Filled 2023-05-16 (×2): qty 2

## 2023-05-16 MED ORDER — THIAMINE MONONITRATE 100 MG PO TABS
100.0000 mg | ORAL_TABLET | Freq: Every day | ORAL | Status: DC
  Administered 2023-05-16 – 2023-05-17 (×2): 100 mg via ORAL
  Filled 2023-05-16 (×2): qty 1

## 2023-05-16 NOTE — H&P (Signed)
History and Physical    Patient: Melvin Knight ZOX:[id number] DOB: 06/15/1981 DOA: 05/16/2023 DOS: the patient was seen and examined on 05/16/2023 PCP: Margarita Mail, DO  Patient coming from: Home  Chief Complaint:  Chief Complaint  Patient presents with   Alcohol Problem    Patient brought in via medic for tremors due to alcohol withdrawals. Patient drinks at least 6 drinks every evening and had no intentions of drinking today but "tremors just started out of nowhere". Patient has tried quitting before but was just tapering off his drinking this time.     HPI: Melvin Knight is a 42 y.o. male with medical history significant for HTN, anxiety, moderate alcohol abuse with history of alcohol withdrawal seizures who presents to the ED with tremors related to alcohol withdrawal.  Patient has been attempt taking to stop drinking for quite some time but has had several relapses.  He was hospitalized for alcohol withdrawal seizure in November 2023 following which he underwent a 6-week rehab and remained sober for several weeks then relapsed.  Rehospitalized with alcohol withdrawal symptoms from 5/27 to 5/29 when he left AMA.  He stopped drinking a few days ago but then developed severe tremors and presented to the emergency room for admission for detox.  He has had no seizures. ED course and data review: BP 148/97 with pulse 104.  Temp 100.  Labs significant for potassium of 2.7 and magnesium 1.0. Noted to have leukopenia and thrombocytopenia as well as double-digit AST ALT elevation and bilirubin of 1.6. EKG, personally viewed and interpreted showing NSR at 84 with no acute ST-T wave changes. Chest x-ray nonacute Patient treated with an IV fluid bolus thiamine and was initially given oral lorazepam and chlordiazepoxide however he continued to have tremors.  Hospitalist consulted for admission.  He received oral potassium and IV magnesium repletion in the ED.     Past Medical History:  Diagnosis  Date   AKI (acute kidney injury) (HCC) 09/24/2022   Anxiety    Hypertension    Hypokalemia    Hypokalemia 09/24/2022   Hypomagnesemia 09/24/2022   History reviewed. No pertinent surgical history. Social History:  reports that he has been smoking cigarettes. He has been smoking an average of 1 pack per day. He has never used smokeless tobacco. He reports current alcohol use of about 6.0 standard drinks of alcohol per week. He reports that he does not currently use drugs.  No Known Allergies  Family History  Problem Relation Age of Onset   Hypertension Father    Stroke Father    Cancer Sister     Prior to Admission medications   Medication Sig Start Date End Date Taking? Authorizing Provider  folic acid (FOLVITE) 1 MG tablet Take 1 tablet (1 mg total) by mouth daily. 09/26/22   Esaw Grandchild A, DO  hydrocortisone 1 % ointment Apply 1 Application topically 2 (two) times daily. Patient not taking: Reported on 04/18/2023 08/27/22   Margarita Mail, DO  hydrOXYzine (ATARAX) 10 MG tablet Take 1 tablet (10 mg total) by mouth 3 (three) times daily as needed for anxiety. 03/17/23   Margarita Mail, DO  Multiple Vitamins-Minerals (MULTIVITAMIN WITH MINERALS) tablet Take 1 tablet by mouth daily. 07/06/22   Margarita Mail, DO  potassium chloride SA (KLOR-CON M) 20 MEQ tablet Take 1 tablet (20 mEq total) by mouth daily for 5 days. 04/28/23 05/03/23  Margarita Mail, DO  thiamine (VITAMIN B-1) 100 MG tablet Take 1 tablet (100 mg total) by mouth daily.  09/26/22   Pennie Banter, DO  vitamin B-12 (CYANOCOBALAMIN) 100 MCG tablet Take 100 mcg by mouth daily.    [provider]    Physical Exam: Vitals:   05/16/23 1854 05/16/23 1855 05/16/23 2130 05/16/23 2144  BP:   (!) 153/104   Pulse:   98   Resp:   18   Temp:    99.3 F (37.4 C)  TempSrc:    Oral  SpO2: 97%  98%   Weight:  90.7 kg    Height:  5\' 7"  (1.702 m)     Physical Exam Vitals and nursing note reviewed.   Constitutional:      General: He is not in acute distress. HENT:     Head: Normocephalic and atraumatic.  Cardiovascular:     Rate and Rhythm: Normal rate and regular rhythm.     Heart sounds: Normal heart sounds.  Pulmonary:     Effort: Pulmonary effort is normal.     Breath sounds: Normal breath sounds.  Abdominal:     Palpations: Abdomen is soft.     Tenderness: There is no abdominal tenderness.  Neurological:     Mental Status: Mental status is at baseline.     Labs on Admission: I have personally reviewed following labs and imaging studies  CBC: Recent Labs  Lab 05/16/23 1930  WBC 3.8*  NEUTROABS 2.3  HGB 13.5  HCT 36.9*  MCV 93.4  PLT 124*   Basic Metabolic Panel: Recent Labs  Lab 05/16/23 1930 05/16/23 2048  NA 139  --   K 2.7*  --   CL 104  --   CO2 22  --   GLUCOSE 101*  --   BUN 6  --   CREATININE 0.71  --   CALCIUM 7.9*  --   MG  --  1.0*   GFR: Estimated Creatinine Clearance: 133.1 mL/min (by C-G formula based on SCr of 0.71 mg/dL). Liver Function Tests: Recent Labs  Lab 05/16/23 1930  AST 80*  ALT 62*  ALKPHOS 91  BILITOT 1.6*  PROT 6.5  ALBUMIN 3.5   No results for input(s): "LIPASE", "AMYLASE" in the last 168 hours. No results for input(s): "AMMONIA" in the last 168 hours. Coagulation Profile: No results for input(s): "INR", "PROTIME" in the last 168 hours. Cardiac Enzymes: Recent Labs  Lab 05/16/23 1930  CKTOTAL 148   BNP (last 3 results) No results for input(s): "PROBNP" in the last 8760 hours. HbA1C: No results for input(s): "HGBA1C" in the last 72 hours. CBG: No results for input(s): "GLUCAP" in the last 168 hours. Lipid Profile: No results for input(s): "CHOL", "HDL", "LDLCALC", "TRIG", "CHOLHDL", "LDLDIRECT" in the last 72 hours. Thyroid Function Tests: No results for input(s): "TSH", "T4TOTAL", "FREET4", "T3FREE", "THYROIDAB" in the last 72 hours. Anemia Panel: No results for input(s): "VITAMINB12", "FOLATE",  "FERRITIN", "TIBC", "IRON", "RETICCTPCT" in the last 72 hours. Urine analysis:    Component Value Date/Time   COLORURINE AMBER (A) 09/24/2022 0051   APPEARANCEUR CLOUDY (A) 09/24/2022 0051   LABSPEC 1.025 09/24/2022 0051   PHURINE 5.0 09/24/2022 0051   GLUCOSEU NEGATIVE 09/24/2022 0051   HGBUR NEGATIVE 09/24/2022 0051   BILIRUBINUR NEGATIVE 09/24/2022 0051   KETONESUR 5 (A) 09/24/2022 0051   PROTEINUR 100 (A) 09/24/2022 0051   NITRITE NEGATIVE 09/24/2022 0051   LEUKOCYTESUR NEGATIVE 09/24/2022 0051    Radiological Exams on Admission: DG Chest 2 View  Result Date: 05/16/2023 CLINICAL DATA:  sob, eval for pna EXAM: CHEST -  2 VIEW COMPARISON:  None Available. FINDINGS: The cardiomediastinal silhouette is mildly enlarged in contour. No pleural effusion. No pneumothorax. No acute pleuroparenchymal abnormality. Gaseous distension of bowel. No acute osseous abnormality noted. IMPRESSION: 1.  No acute cardiopulmonary abnormality. Electronically Signed   By: Meda Klinefelter M.D.   On: 05/16/2023 20:00     Data Reviewed: Relevant notes from primary care and specialist visits, past discharge summaries as available in EHR, including Care Everywhere. Prior diagnostic testing as pertinent to current admission diagnoses Updated medications and problem lists for reconciliation ED course, including vitals, labs, imaging, treatment and response to treatment Triage notes, nursing and pharmacy notes and ED provider's notes Notable results as noted in HPI   Assessment and Plan:   * Alcohol withdrawal delirium, acute, mixed level of activity (HCC) Moderate alcohol use disorder with history of alcohol withdrawal seizure Patient with tremulousness, hypertensive and tachycardic Hospitalized for same on 04/20/2023, signing out AMA 2 days later  CIWA protocol Ativan as needed seizure given history Folate and thiamine Fall and aspiration precaution TOC consult for resources on detox  Alcoholic  liver disease/alcoholic steatosis Patient with neutropenia, thrombocytopenia, elevated AST ALT and bilirubin.  Denies abdominal pain Abdominal ultrasound September 2023 showed steatosis No further workup ordered at this time For counseling on alcohol cessation when improved   Hypertensive urgency BP uncontrolled, likely related to withdrawal Should improve with treatment of alcohol withdrawal delirium Hydralazine as needed   Hypomagnesemia Received repletion in the ED Continue to monitor and replete as necessary   Hypokalemia Received repletion in the ED Continue to monitor   Anxiety and depression Hold hydroxyzine       DVT prophylaxis: Lovenox  Consults: none  Advance Care Planning:   Code Status: Prior   Family Communication: none  Disposition Plan: Back to previous home environment  Severity of Illness: The appropriate patient status for this patient is INPATIENT. Inpatient status is judged to be reasonable and necessary in order to provide the required intensity of service to ensure the patient's safety. The patient's presenting symptoms, physical exam findings, and initial radiographic and laboratory data in the context of their chronic comorbidities is felt to place them at high risk for further clinical deterioration. Furthermore, it is not anticipated that the patient will be medically stable for discharge from the hospital within 2 midnights of admission.   * I certify that at the point of admission it is my clinical judgment that the patient will require inpatient hospital care spanning beyond 2 midnights from the point of admission due to high intensity of service, high risk for further deterioration and high frequency of surveillance required.*  Author: Andris Baumann, MD 05/16/2023 10:12 PM  For on call review [email or web address].

## 2023-05-16 NOTE — ED Provider Notes (Signed)
[facility] Provider Note    Event Date/Time   First MD Initiated Contact with Patient 05/16/23 1847     (approximate)   History   Alcohol Problem (Patient brought in via medic for tremors due to alcohol withdrawals. Patient drinks at least 6 drinks every evening and had no intentions of drinking today but "tremors just started out of nowhere". Patient has tried quitting before but was just tapering off his drinking this time. )   HPI  Melvin Knight is a 42 y.o. male with extensive alcohol dependence history recently trying to cut back on drinking.  States that he would have 5 to 6 glasses of liquor daily.  Over the past 24 hours started having worsening shakes and tremors.  He is interested in detox.  Has had DTs in the past.  Denies any pain.     Physical Exam   Triage Vital Signs: ED Triage Vitals  Enc Vitals Group     BP 05/16/23 1852 (!) 148/97     Pulse Rate 05/16/23 1852 (!) 104     Resp 05/16/23 1852 17     Temp 05/16/23 1852 100 F (37.8 C)     Temp src --      SpO2 05/16/23 1852 100 %     Weight 05/16/23 1855 200 lb (90.7 kg)     Height 05/16/23 1855 5\' 7"  (1.702 m)     Head Circumference --      Peak Flow --      Pain Score --      Pain Loc --      Pain Edu? --      Excl. in GC? --     Most recent vital signs: Vitals:   05/16/23 2130 05/16/23 2144  BP: (!) 153/104   Pulse: 98   Resp: 18   Temp:  99.3 F (37.4 C)  SpO2: 98%      Constitutional: Alert  Eyes: Conjunctivae are normal.  Head: Atraumatic. Nose: No congestion/rhinnorhea. Mouth/Throat: Mucous membranes are moist.   Neck: Painless ROM.  Cardiovascular:   Good peripheral circulation. Respiratory: Normal respiratory effort.  No retractions.  Gastrointestinal: Soft and nontender.  Musculoskeletal:  no deformity Neurologic:  MAE spontaneously. No gross focal neurologic deficits are appreciated.  Skin:  Skin is warm, dry and intact. No rash noted. Psychiatric:  Mood and affect are normal. Speech and behavior are normal.    ED Results / Procedures / Treatments   Labs (all labs ordered are listed, but only abnormal results are displayed) Labs Reviewed  CBC WITH DIFFERENTIAL/PLATELET - Abnormal; Notable for the following components:      Result Value   WBC 3.8 (*)    RBC 3.95 (*)    HCT 36.9 (*)    MCH 34.2 (*)    MCHC 36.6 (*)    Platelets 124 (*)    All other components within normal limits  COMPREHENSIVE METABOLIC PANEL - Abnormal; Notable for the following components:   Potassium 2.7 (*)    Glucose, Bld 101 (*)    Calcium 7.9 (*)    AST 80 (*)    ALT 62 (*)    Total Bilirubin 1.6 (*)    All other components within normal limits  MAGNESIUM - Abnormal; Notable for the following components:   Magnesium 1.0 (*)    All other components within normal limits  SARS CORONAVIRUS 2 BY RT PCR  CK     EKG  ED ECG REPORT  I, Willy Eddy, the attending physician, personally viewed and interpreted this ECG.   Date: 05/16/2023  EKG Time: 19:07  Rate: 85  Rhythm: sinus  Axis: normal  Intervals: normal  ST&T Change: no stemi, no depressions    RADIOLOGY    PROCEDURES:  Critical Care performed:   Procedures   MEDICATIONS ORDERED IN ED: Medications  LORazepam (ATIVAN) tablet 2 mg (2 mg Oral Given 05/16/23 1924)    Or  LORazepam (ATIVAN) tablet 0-4 mg ( Oral See Alternative 05/16/23 1924)  LORazepam (ATIVAN) tablet 2 mg (has no administration in time range)    Or  LORazepam (ATIVAN) tablet 0-4 mg (has no administration in time range)  thiamine (VITAMIN B1) tablet 100 mg (100 mg Oral Given 05/16/23 1924)    Or  thiamine (VITAMIN B1) injection 100 mg ( Intravenous See Alternative 05/16/23 1924)  potassium chloride (KLOR-CON) packet 60 mEq (60 mEq Oral Given 05/16/23 2108)  magnesium sulfate IVPB 2 g 50 mL (2 g Intravenous New Bag/Given 05/16/23 2133)  sodium chloride 0.9 % bolus 1,000 mL (0 mLs Intravenous Stopped 05/16/23  2057)  chlordiazePOXIDE (LIBRIUM) capsule 25 mg (25 mg Oral Given 05/16/23 1924)     IMPRESSION / MDM / ASSESSMENT AND PLAN / ED COURSE  I reviewed the triage vital signs and the nursing notes.                              Differential diagnosis includes, but is not limited to, alcohol withdrawal, electrolyte abnormality, malnutrition, sepsis  Patient presenting to the ER for evaluation of symptoms as described above.  Based on symptoms, risk factors and considered above differential, this presenting complaint could reflect a potentially life-threatening illness therefore the patient will be placed on continuous pulse oximetry and telemetry for monitoring.  Laboratory evaluation will be sent to evaluate for the above complaints.  Patient scored in the low teens on CIWA based on subjectivity.  Will give Ativan as well as Librium.  Will order IV fluids.  Will check CK.    Clinical Course as of 05/16/23 2158  Mon May 16, 2023  1950 Chest x-ray on my review and interpretation without evidence of infiltrate or aspiration. [PR]  2122 Magnesium is critically low.  Have repleted potassium p.o. we will order IV magnesium.  Will consult hospitalist for admission. [PR]    Clinical Course User Index [PR] Willy Eddy, MD     FINAL CLINICAL IMPRESSION(S) / ED DIAGNOSES   Final diagnoses:  Alcohol withdrawal syndrome with complication [facility])     Rx / DC Orders   ED Discharge Orders     None        Note:  This document was prepared using Dragon voice recognition software and may include unintentional dictation errors.    Willy Eddy, MD 05/16/23 2158

## 2023-05-16 NOTE — ED Triage Notes (Signed)
Patient brought in via medic for tremors due to alcohol withdrawals. Patient drinks at least 6 drinks every evening and had no intentions of drinking today but "tremors just started out of nowhere". Patient has tried quitting before but was just tapering off his drinking this time.

## 2023-05-17 ENCOUNTER — Encounter: Payer: Self-pay | Admitting: Internal Medicine

## 2023-05-17 DIAGNOSIS — E876 Hypokalemia: Secondary | ICD-10-CM

## 2023-05-17 DIAGNOSIS — M109 Gout, unspecified: Secondary | ICD-10-CM

## 2023-05-17 DIAGNOSIS — I10 Essential (primary) hypertension: Secondary | ICD-10-CM

## 2023-05-17 DIAGNOSIS — D696 Thrombocytopenia, unspecified: Secondary | ICD-10-CM

## 2023-05-17 DIAGNOSIS — F1020 Alcohol dependence, uncomplicated: Secondary | ICD-10-CM

## 2023-05-17 DIAGNOSIS — M10071 Idiopathic gout, right ankle and foot: Secondary | ICD-10-CM

## 2023-05-17 DIAGNOSIS — R062 Wheezing: Secondary | ICD-10-CM

## 2023-05-17 LAB — COMPREHENSIVE METABOLIC PANEL
ALT: 57 U/L — ABNORMAL HIGH (ref 0–44)
AST: 77 U/L — ABNORMAL HIGH (ref 15–41)
Albumin: 3.4 g/dL — ABNORMAL LOW (ref 3.5–5.0)
Alkaline Phosphatase: 85 U/L (ref 38–126)
Anion gap: 10 (ref 5–15)
BUN: 6 mg/dL (ref 6–20)
CO2: 23 mmol/L (ref 22–32)
Calcium: 7.5 mg/dL — ABNORMAL LOW (ref 8.9–10.3)
Chloride: 102 mmol/L (ref 98–111)
Creatinine, Ser: 0.62 mg/dL (ref 0.61–1.24)
GFR, Estimated: >60 mL/min (ref >60)
Glucose, Bld: 118 mg/dL — ABNORMAL HIGH (ref 70–99)
Potassium: 2.9 mmol/L — ABNORMAL LOW (ref 3.5–5.1)
Sodium: 135 mmol/L (ref 135–145)
Total Bilirubin: 1.9 mg/dL — ABNORMAL HIGH (ref 0.3–1.2)
Total Protein: 6.5 g/dL (ref 6.5–8.1)

## 2023-05-17 LAB — URIC ACID: Uric Acid, Serum: 4.7 mg/dL (ref 3.7–8.6)

## 2023-05-17 LAB — POTASSIUM: Potassium: 3.3 mmol/L — ABNORMAL LOW (ref 3.5–5.1)

## 2023-05-17 LAB — PHOSPHORUS: Phosphorus: 1.8 mg/dL — ABNORMAL LOW (ref 2.5–4.6)

## 2023-05-17 MED ORDER — AMLODIPINE BESYLATE 5 MG PO TABS
5.0000 mg | ORAL_TABLET | Freq: Every day | ORAL | Status: DC
  Administered 2023-05-17: 5 mg via ORAL
  Filled 2023-05-17: qty 1

## 2023-05-17 MED ORDER — CLONIDINE HCL 0.1 MG PO TABS
0.1000 mg | ORAL_TABLET | Freq: Two times a day (BID) | ORAL | Status: DC
  Administered 2023-05-17 (×2): 0.1 mg via ORAL
  Filled 2023-05-17 (×2): qty 1

## 2023-05-17 MED ORDER — K PHOS MONO-SOD PHOS DI & MONO 155-852-130 MG PO TABS
500.0000 mg | ORAL_TABLET | Freq: Four times a day (QID) | ORAL | Status: DC
  Administered 2023-05-17 (×3): 500 mg via ORAL
  Filled 2023-05-17 (×4): qty 2

## 2023-05-17 MED ORDER — MAGNESIUM SULFATE 4 GM/100ML IV SOLN
4.0000 g | Freq: Once | INTRAVENOUS | Status: AC
  Administered 2023-05-17: 4 g via INTRAVENOUS
  Filled 2023-05-17: qty 100

## 2023-05-17 MED ORDER — COLCHICINE 0.6 MG PO TABS: 0.6000 mg | ORAL_TABLET | Freq: Every day | ORAL | Status: DC

## 2023-05-17 MED ORDER — POTASSIUM CHLORIDE 10 MEQ/100ML IV SOLN
10.0000 meq | INTRAVENOUS | Status: AC
  Administered 2023-05-17 (×2): 10 meq via INTRAVENOUS
  Filled 2023-05-17 (×2): qty 100

## 2023-05-17 MED ORDER — IPRATROPIUM-ALBUTEROL 0.5-2.5 (3) MG/3ML IN SOLN
3.0000 mL | Freq: Four times a day (QID) | RESPIRATORY_TRACT | Status: DC
  Administered 2023-05-17 (×2): 3 mL via RESPIRATORY_TRACT
  Filled 2023-05-17 (×2): qty 3

## 2023-05-17 MED ORDER — IPRATROPIUM-ALBUTEROL 0.5-2.5 (3) MG/3ML IN SOLN: 3.0000 mL | Freq: Four times a day (QID) | RESPIRATORY_TRACT | Status: DC | PRN

## 2023-05-17 MED ORDER — COLCHICINE 0.6 MG PO TABS
0.6000 mg | ORAL_TABLET | ORAL | Status: AC
  Administered 2023-05-17: 0.6 mg via ORAL
  Filled 2023-05-17 (×2): qty 1

## 2023-05-17 MED ORDER — METHYLPREDNISOLONE SODIUM SUCC 40 MG IJ SOLR
40.0000 mg | Freq: Once | INTRAMUSCULAR | Status: AC
  Administered 2023-05-17: 40 mg via INTRAVENOUS
  Filled 2023-05-17: qty 1

## 2023-05-17 NOTE — Progress Notes (Signed)
Progress Note   Patient: Melvin Knight ATF:[id number] DOB: [birth year] DOA: 05/16/2023     1 DOS: the patient was seen and examined on 05/17/2023   Brief hospital course: 42 year old man with past medical history of hypertension anxiety alcohol abuse.  He states that he has been trying to quit drinking without success.  Quit drinking a few days ago.  He has been having some tremors at home and having difficulty with his balance.  Found to have electrolyte abnormalities.  Hospitalist services contacted for admission.  6/25.  Patient feeling better than yesterday.  Magnesium and potassium replacement today.  Called back to the bedside with right first toe pain likely gout.  He has had this before.  A dose of Solu-Medrol and colchicine ordered.  Assessment and Plan: * Hypomagnesemia 4 g IV magnesium today and recheck level tomorrow  Hypophosphatemia Replace oral phosphorus today.  Hypokalemia Replace oral and IV potassium today  Alcohol withdrawal (HCC) Alcohol withdrawal protocol.  Alcohol use disorder, moderate, dependence (HCC) Alcohol withdrawal protocol.  Patient interested in quitting drinking.  Acute gout Right first toe.  Given dose of stat colchicine and dose of Solu-Medrol.  Continue colchicine daily.  Add on a uric acid.  Thrombocytopenia (HCC) Secondary to alcohol.  Hepatitis profiles were negative back in 2020.  Wheeze History of smoking.  Nebulizer treatments.  Essential hypertension Start Norvasc 5 mg daily  Abnormal LFTs Secondary to alcohol abuse        Subjective: Patient interested in stopping drinking.  He was in an inpatient center back in January.  Patient having tremor and difficulty walking around.  Called back to room secondary to right first toe pain.  Physical Exam: Vitals:   05/17/23 1015 05/17/23 1030 05/17/23 1045 05/17/23 1051  BP:      Pulse: 87 85 75 90  Resp: 17 14    Temp:      TempSrc:      SpO2: 97% 96% 100%   Weight:       Height:       Physical Exam HENT:     Head: Normocephalic.     Mouth/Throat:     Pharynx: No oropharyngeal exudate.  Eyes:     General: Lids are normal.     Conjunctiva/sclera: Conjunctivae normal.  Cardiovascular:     Rate and Rhythm: Normal rate and regular rhythm.     Heart sounds: Normal heart sounds, S1 normal and S2 normal.  Pulmonary:     Breath sounds: Examination of the right-lower field reveals wheezing. Examination of the left-lower field reveals wheezing. Wheezing present. No decreased breath sounds, rhonchi or rales.  Abdominal:     Palpations: Abdomen is soft.     Tenderness: There is no abdominal tenderness.  Musculoskeletal:     Right lower leg: No swelling.     Left lower leg: No swelling.     Comments: Right first toe painful to light touch.  Skin:    General: Skin is warm.     Findings: No rash.  Neurological:     Mental Status: He is alert and oriented to person, place, and time.     Data Reviewed: Magnesium 1.0, phosphorus 1.8, potassium 2.9, creatinine 0.62, AST 77, ALT 57, total bilirubin 1.9, platelet count 124, white blood cell count 3.8, hemoglobin 13.5, chest x-ray negative   Disposition: Status is: Inpatient Remains inpatient appropriate because: Replacing electrolytes today IV and orally, ambulate.  Alcohol withdrawal protocol.  Treating acute gout  Planned Discharge Destination: Home  Time spent: 30 minutes  Author: Alford Highland, MD 05/17/2023 11:32 AM  For on call review [email or web address].

## 2023-05-17 NOTE — Assessment & Plan Note (Signed)
Secondary to alcohol abuse 

## 2023-05-17 NOTE — Assessment & Plan Note (Signed)
Alcohol withdrawal protocol 

## 2023-05-17 NOTE — Assessment & Plan Note (Signed)
Right first toe.  Given dose of stat colchicine and dose of Solu-Medrol.  Continue colchicine daily.  Add on a uric acid.

## 2023-05-17 NOTE — Assessment & Plan Note (Signed)
History of smoking.  Nebulizer treatments.

## 2023-05-17 NOTE — Assessment & Plan Note (Signed)
4 g IV magnesium today and recheck level tomorrow

## 2023-05-17 NOTE — Assessment & Plan Note (Signed)
Start Norvasc 5mg daily

## 2023-05-17 NOTE — ED Notes (Signed)
Report off to tiffany rn cpod nurse  pt moved to room 31.

## 2023-05-17 NOTE — Assessment & Plan Note (Signed)
Secondary to alcohol.  Hepatitis profiles were negative back in 2020.

## 2023-05-17 NOTE — Assessment & Plan Note (Signed)
Replace oral phosphorus today.

## 2023-05-17 NOTE — Assessment & Plan Note (Signed)
Replace oral and IV potassium today

## 2023-05-17 NOTE — Hospital Course (Signed)
42 year old man with past medical history of hypertension anxiety alcohol abuse.  He states that he has been trying to quit drinking without success.  Quit drinking a few days ago.  He has been having some tremors at home and having difficulty with his balance.  Found to have electrolyte abnormalities.  Hospitalist services contacted for admission.  6/25.  Patient feeling better than yesterday.  Magnesium and potassium replacement today.  Called back to the bedside with right first toe pain likely gout.  He has had this before.  A dose of Solu-Medrol and colchicine ordered.

## 2023-05-17 NOTE — Assessment & Plan Note (Addendum)
Alcohol withdrawal protocol.  Patient interested in quitting drinking.

## 2023-05-18 NOTE — Discharge Summary (Signed)
  Physician Discharge Summary   Patient: Melvin Knight MRN: [id number] DOB: [birth year]  Admit date:     05/16/2023  Discharge date: 05/18/23  Discharge Physician: Andris Baumann   PCP: Margarita Mail, DO   AMA discharge summary  42 year old man with past medical history of hypertension anxiety alcohol abuse. He states that he has been trying to quit drinking without success. Quit drinking a few days ago. He has been having some tremors at home and having difficulty with his balance. Found to have electrolyte abnormalities. On 05/18/2023 at around 4 AM, advised by nurse that patient left AGAINST MEDICAL ADVICE and the called him back and he signed the paperwork and proceeded to leave.  Patient was not seen prior to leaving    Discharge Diagnoses: Principal Problem:   Hypomagnesemia Active Problems:   Hypokalemia   Hypophosphatemia   Alcohol withdrawal (HCC)   Alcohol use disorder, moderate, dependence (HCC)   History of seizure due to alcohol withdrawal   Acute gout   Abnormal LFTs   Essential hypertension   Anxiety and depression   Alcoholic liver disease (HCC)   Wheeze   Thrombocytopenia (HCC)  Resolved Problems:   * No resolved hospital problems. *     Discharge time spent: less than 30 minutes.  Signed: Andris Baumann, MD [facility] 05/18/2023

## 2023-05-18 NOTE — Progress Notes (Signed)
Patient decided to leave AMA. Patient aware of risk factor and bad outcomes. IV removed. Pt took all of his belongings with him. Dr Ninfa Linden informed.

## 2023-05-19 ENCOUNTER — Telehealth: Payer: Self-pay

## 2023-05-19 NOTE — Transitions of Care (Post Inpatient/ED Visit) (Signed)
   05/19/2023  Name: Melvin Knight MRN: [id number] DOB: May 30, 1981  Today's TOC FU Call Status: Today's TOC FU Call Status:: Unsuccessul Call (1st Attempt) Unsuccessful Call (1st Attempt) Date: 05/19/23  Attempted to reach the patient regarding the most recent Inpatient/ED visit.  Follow Up Plan: Additional outreach attempts will be made to reach the patient to complete the Transitions of Care (Post Inpatient/ED visit) call.   Signature Karena Addison, LPN [facility] Nurse Health Advisor Direct Dial [phone]

## 2023-05-23 NOTE — Progress Notes (Deleted)
Established Patient Office Visit  Subjective:  Patient ID: Melvin Knight, male    DOB: 06/28/1981  Age: 42 y.o. MRN: [id number]  CC:  No chief complaint on file.   HPI Melvin Knight presents for hospital follow up but left AMA prior to being evaluated by hospitalist.   Discharge Date: 05/18/23 Diagnosis: alcohol withdrawal with DT - patient left AMA Procedures/tests: platelets 98, CT head negative  Consultants: None New medications: None Discontinued medications: Lexapro  Discharge instructions:  check CBC, CMP Status: stable  Patient states he slipped back into the habit of having an alcoholic beverage in the evenings and was having hallucinations which prompted him to go to the ER. He ended leaving AMA. He states he has not had a drink since getting out of the hospital.   Hypertension: -Medications: Nothing currently, had been on Benazepril-HCTZ 20-12.5 mg, Metoprolol 25 mg daily - per patient once he stopped drinking he was becoming light headed in rehab, so all of his blood pressure medications were discontinued. He did take one this morning because he was rushing in traffic and his BP was high.  -Denies any SOB, CP, vision changes, LE edema but does occasionally get lightheaded.   Elevated LFTs/Alcohol Use Disorder: -Liver function 5/24 AST 98, ALT 61 -Had been sober for several weeks after completing in-patient rehab program but had a relapse the end of May -He is currently on a multivitamin and B complex vitamin, as well as folate and thiamine   Stress/MDD: -Mood status: Exacerbated  -Current treatment: had been on Lexapro 20 mg daily but no longer taking. Does sometimes have anxiety associated with heights although this has improved.   Past Medical History:  Diagnosis Date   AKI (acute kidney injury) (HCC) 09/24/2022   Anxiety    Hypertension    Hypokalemia    Hypokalemia 09/24/2022   Hypomagnesemia 09/24/2022    No past surgical history on file.  Family History   Problem Relation Age of Onset   Hypertension Father    Stroke Father    Cancer Sister     Social History   Socioeconomic History   Marital status: Single    Spouse name: Not on file   Number of children: Not on file   Years of education: Not on file   Highest education level: Bachelor's degree (e.g., BA, AB, BS)  Occupational History   Not on file  Tobacco Use   Smoking status: Every Day    Packs/day: 1    Types: Cigarettes   Smokeless tobacco: Never  Vaping Use   Vaping Use: Never used  Substance and Sexual Activity   Alcohol use: Yes    Alcohol/week: 6.0 standard drinks of alcohol    Types: 6 Shots of liquor per week    Comment: 6 cocktails per day   Drug use: Not Currently   Sexual activity: Not Currently  Other Topics Concern   Not on file  Social History Narrative   Right handed    Social Determinants of Health   Financial Resource Strain: Low Risk  (03/16/2023)   Overall Financial Resource Strain (CARDIA)    Difficulty of Paying Living Expenses: Not hard at all  Food Insecurity: No Food Insecurity (05/17/2023)   Hunger Vital Sign    Worried About Running Out of Food in the Last Year: Never true    Ran Out of Food in the Last Year: Never true  Transportation Needs: No Transportation Needs (05/17/2023)   PRAPARE - Transportation  Lack of Transportation (Medical): No    Lack of Transportation (Non-Medical): No  Physical Activity: Sufficiently Active (03/16/2023)   Exercise Vital Sign    Days of Exercise per Week: 3 days    Minutes of Exercise per Session: 150+ min  Stress: No Stress Concern Present (03/16/2023)   [employer] of Occupational Health - Occupational Stress Questionnaire    Feeling of Stress : Not at all  Social Connections: Unknown (03/16/2023)   Social Connection and Isolation Panel [NHANES]    Frequency of Communication with Friends and Family: More than three times a week    Frequency of Social Gatherings with Friends and Family: Twice  a week    Attends Religious Services: Not on Insurance claims handler of Clubs or Organizations: No    Attends Banker Meetings: Not on file    Marital Status: Never married  Intimate Partner Violence: Not At Risk (05/17/2023)   Humiliation, Afraid, Rape, and Kick questionnaire    Fear of Current or Ex-Partner: No    Emotionally Abused: No    Physically Abused: No    Sexually Abused: No    Outpatient Medications Prior to Visit  Medication Sig Dispense Refill   folic acid (FOLVITE) 1 MG tablet Take 1 tablet (1 mg total) by mouth daily. 30 tablet 2   hydrocortisone 1 % ointment Apply 1 Application topically 2 (two) times daily. (Patient not taking: Reported on 04/18/2023) 30 g 0   hydrOXYzine (ATARAX) 10 MG tablet Take 1 tablet (10 mg total) by mouth 3 (three) times daily as needed for anxiety. 30 tablet 0   Multiple Vitamins-Minerals (MULTIVITAMIN WITH MINERALS) tablet Take 1 tablet by mouth daily. 90 tablet 3   potassium chloride SA (KLOR-CON M) 20 MEQ tablet Take 1 tablet (20 mEq total) by mouth daily for 5 days. 5 tablet 0   thiamine (VITAMIN B-1) 100 MG tablet Take 1 tablet (100 mg total) by mouth daily. 30 tablet 2   vitamin B-12 (CYANOCOBALAMIN) 100 MCG tablet Take 100 mcg by mouth daily.     No facility-administered medications prior to visit.    No Known Allergies  ROS Review of Systems  Constitutional:  Negative for chills and fever.  Respiratory:  Negative for shortness of breath.   Cardiovascular:  Negative for palpitations.      Objective:    Physical Exam Constitutional:      Appearance: Normal appearance.  HENT:     Head: Normocephalic and atraumatic.  Eyes:     Conjunctiva/sclera: Conjunctivae normal.  Cardiovascular:     Rate and Rhythm: Regular rhythm. Tachycardia present.  Pulmonary:     Effort: Pulmonary effort is normal.     Breath sounds: Normal breath sounds.  Skin:    General: Skin is warm and dry.  Neurological:     General: No focal  deficit present.     Mental Status: He is alert. Mental status is at baseline.  Psychiatric:        Mood and Affect: Mood and affect normal.        Behavior: Behavior normal.     There were no vitals taken for this visit. Wt Readings from Last 3 Encounters:  05/16/23 200 lb (90.7 kg)  04/27/23 191 lb 11.2 oz (87 kg)  04/18/23 195 lb (88.5 kg)     Health Maintenance Due  Topic Date Due   COVID-19 Vaccine (2 - 2023-24 season) 07/23/2022        There are  no preventive care reminders to display for this patient.  Lab Results  Component Value Date   TSH 1.562 04/18/2023   Lab Results  Component Value Date   WBC 3.8 (L) 05/16/2023   HGB 13.5 05/16/2023   HCT 36.9 (L) 05/16/2023   MCV 93.4 05/16/2023   PLT 124 (L) 05/16/2023   Lab Results  Component Value Date   NA 135 05/17/2023   K 2.9 (L) 05/17/2023   CO2 23 05/17/2023   GLUCOSE 118 (H) 05/17/2023   BUN 6 05/17/2023   CREATININE 0.62 05/17/2023   BILITOT 1.9 (H) 05/17/2023   ALKPHOS 85 05/17/2023   AST 77 (H) 05/17/2023   ALT 57 (H) 05/17/2023   PROT 6.5 05/17/2023   ALBUMIN 3.4 (L) 05/17/2023   CALCIUM 7.5 (L) 05/17/2023   ANIONGAP 10 05/17/2023   EGFR 112 04/27/2023   Lab Results  Component Value Date   CHOL 258 (H) 01/11/2022   Lab Results  Component Value Date   HDL 61 01/11/2022   Lab Results  Component Value Date   LDLCALC 166 (H) 01/11/2022   Lab Results  Component Value Date   TRIG 162 (H) 01/11/2022   Lab Results  Component Value Date   CHOLHDL 4.2 01/11/2022   Lab Results  Component Value Date   HGBA1C 5.7 (H) 02/08/2022      Assessment & Plan:   1. Hospital discharge follow-up/Alcohol use disorder, moderate, dependence [facility]): Discharge summary, labs and imaging reviewed. Discussed triggers of recent relapse and potential treatment. Will recheck labs, if LFT's down trending plan to treat with low dose Naltrexone to prevent further relapse. Information on the medication printed  for the patient. Plan to recheck in 1 month.   - CBC w/Diff/Platelet - COMPLETE METABOLIC PANEL WITH GFR  2. Primary hypertension: Increased today, patient states it is due to stress/traffic. Will monitor at home, will not restart regular BP medications at this time, plan to recheck at follow up.   - CBC w/Diff/Platelet - COMPLETE METABOLIC PANEL WITH GFR   Follow-up: No follow-ups on file.    Margarita Mail, DO

## 2023-05-23 NOTE — Transitions of Care (Post Inpatient/ED Visit) (Signed)
   05/23/2023  Name: Melvin Knight MRN: [id number] DOB: 01-20-1981  Today's TOC FU Call Status: Today's TOC FU Call Status:: Unsuccessful Call (2nd Attempt) Unsuccessful Call (1st Attempt) Date: 05/19/23 Unsuccessful Call (2nd Attempt) Date: 05/23/23  Attempted to reach the patient regarding the most recent Inpatient/ED visit.  Follow Up Plan: Additional outreach attempts will be made to reach the patient to complete the Transitions of Care (Post Inpatient/ED visit) call.   Signature Karena Addison, LPN [facility] Nurse Health Advisor Direct Dial [phone]

## 2023-05-24 ENCOUNTER — Ambulatory Visit: Payer: BC Managed Care – PPO | Admitting: Internal Medicine

## 2023-05-31 ENCOUNTER — Observation Stay
Admission: EM | Admit: 2023-05-31 | Discharge: 2023-06-01 | Discharge status: Against Medical Advice | Payer: BC Managed Care – PPO | Attending: Internal Medicine | Admitting: Internal Medicine

## 2023-05-31 ENCOUNTER — Emergency Department: Payer: BC Managed Care – PPO

## 2023-05-31 ENCOUNTER — Encounter: Payer: Self-pay | Admitting: Emergency Medicine

## 2023-05-31 ENCOUNTER — Other Ambulatory Visit: Payer: Self-pay

## 2023-05-31 DIAGNOSIS — F10129 Alcohol abuse with intoxication, unspecified: Secondary | ICD-10-CM | POA: Diagnosis not present

## 2023-05-31 DIAGNOSIS — F10939 Alcohol use, unspecified with withdrawal, unspecified: Secondary | ICD-10-CM | POA: Diagnosis not present

## 2023-05-31 DIAGNOSIS — R569 Unspecified convulsions: Secondary | ICD-10-CM | POA: Diagnosis not present

## 2023-05-31 DIAGNOSIS — F17210 Nicotine dependence, cigarettes, uncomplicated: Secondary | ICD-10-CM | POA: Diagnosis not present

## 2023-05-31 DIAGNOSIS — F1010 Alcohol abuse, uncomplicated: Secondary | ICD-10-CM | POA: Diagnosis present

## 2023-05-31 DIAGNOSIS — Z79899 Other long term (current) drug therapy: Secondary | ICD-10-CM | POA: Insufficient documentation

## 2023-05-31 DIAGNOSIS — R7401 Elevation of levels of liver transaminase levels: Secondary | ICD-10-CM | POA: Diagnosis not present

## 2023-05-31 DIAGNOSIS — E876 Hypokalemia: Secondary | ICD-10-CM | POA: Diagnosis present

## 2023-05-31 DIAGNOSIS — I10 Essential (primary) hypertension: Secondary | ICD-10-CM | POA: Insufficient documentation

## 2023-05-31 DIAGNOSIS — F10930 Alcohol use, unspecified with withdrawal, uncomplicated: Secondary | ICD-10-CM

## 2023-05-31 LAB — COMPREHENSIVE METABOLIC PANEL
ALT: 49 U/L — ABNORMAL HIGH (ref 0–44)
AST: 72 U/L — ABNORMAL HIGH (ref 15–41)
Albumin: 4.7 g/dL (ref 3.5–5.0)
Alkaline Phosphatase: 98 U/L (ref 38–126)
Anion gap: 17 — ABNORMAL HIGH (ref 5–15)
BUN: 16 mg/dL (ref 6–20)
CO2: 23 mmol/L (ref 22–32)
Calcium: 9.6 mg/dL (ref 8.9–10.3)
Chloride: 96 mmol/L — ABNORMAL LOW (ref 98–111)
Creatinine, Ser: 1.05 mg/dL (ref 0.61–1.24)
GFR, Estimated: >60 mL/min (ref >60)
Glucose, Bld: 97 mg/dL (ref 70–99)
Potassium: 3 mmol/L — ABNORMAL LOW (ref 3.5–5.1)
Sodium: 136 mmol/L (ref 135–145)
Total Bilirubin: 2.6 mg/dL — ABNORMAL HIGH (ref 0.3–1.2)
Total Protein: 8.5 g/dL — ABNORMAL HIGH (ref 6.5–8.1)

## 2023-05-31 LAB — CBC
HCT: 42.1 % (ref 39.0–52.0)
Hemoglobin: 15.3 g/dL (ref 13.0–17.0)
MCH: 34 pg (ref 26.0–34.0)
MCHC: 36.3 g/dL — ABNORMAL HIGH (ref 30.0–36.0)
MCV: 93.6 fL (ref 80.0–100.0)
Platelets: 149 10*3/uL — ABNORMAL LOW (ref 150–400)
RBC: 4.5 MIL/uL (ref 4.22–5.81)
RDW: 12.1 % (ref 11.5–15.5)
WBC: 7.4 10*3/uL (ref 4.0–10.5)
nRBC: 0 % (ref 0.0–0.2)

## 2023-05-31 LAB — MAGNESIUM: Magnesium: 1.4 mg/dL — ABNORMAL LOW (ref 1.7–2.4)

## 2023-05-31 LAB — PHOSPHORUS: Phosphorus: 1.7 mg/dL — ABNORMAL LOW (ref 2.5–4.6)

## 2023-05-31 LAB — ETHANOL: Alcohol, Ethyl (B): <10 mg/dL (ref <10)

## 2023-05-31 MED ORDER — SODIUM CHLORIDE 0.9 % IV BOLUS
1000.0000 mL | Freq: Once | INTRAVENOUS | Status: AC
  Administered 2023-05-31: 1000 mL via INTRAVENOUS

## 2023-05-31 MED ORDER — THIAMINE HCL 100 MG/ML IJ SOLN
100.0000 mg | Freq: Every day | INTRAMUSCULAR | Status: DC
  Administered 2023-05-31: 100 mg via INTRAVENOUS
  Filled 2023-05-31: qty 2

## 2023-05-31 MED ORDER — LORAZEPAM 0.5 MG PO TABS: 0.5000 mg | ORAL_TABLET | ORAL | Status: DC | PRN

## 2023-05-31 MED ORDER — ONDANSETRON HCL 4 MG/2ML IJ SOLN: 4.0000 mg | Freq: Four times a day (QID) | INTRAMUSCULAR | Status: DC | PRN

## 2023-05-31 MED ORDER — MAGNESIUM SULFATE 2 GM/50ML IV SOLN
2.0000 g | Freq: Once | INTRAVENOUS | Status: AC
  Administered 2023-05-31: 2 g via INTRAVENOUS
  Filled 2023-05-31: qty 50

## 2023-05-31 MED ORDER — THIAMINE MONONITRATE 100 MG PO TABS
100.0000 mg | ORAL_TABLET | Freq: Every day | ORAL | Status: DC
  Administered 2023-06-01: 100 mg via ORAL
  Filled 2023-05-31 (×2): qty 1

## 2023-05-31 MED ORDER — POTASSIUM CHLORIDE IN NACL 20-0.9 MEQ/L-% IV SOLN
INTRAVENOUS | Status: DC
  Filled 2023-05-31 (×5): qty 1000

## 2023-05-31 MED ORDER — ENOXAPARIN SODIUM 40 MG/0.4ML IJ SOSY: 40.0000 mg | PREFILLED_SYRINGE | INTRAMUSCULAR | Status: DC

## 2023-05-31 MED ORDER — LORAZEPAM 1 MG PO TABS
1.0000 mg | ORAL_TABLET | ORAL | Status: DC | PRN
  Administered 2023-05-31: 2 mg via ORAL
  Administered 2023-06-01: 1 mg via ORAL
  Filled 2023-05-31: qty 1
  Filled 2023-05-31: qty 2

## 2023-05-31 MED ORDER — K PHOS MONO-SOD PHOS DI & MONO 155-852-130 MG PO TABS
500.0000 mg | ORAL_TABLET | Freq: Once | ORAL | Status: AC
  Administered 2023-05-31: 500 mg via ORAL
  Filled 2023-05-31: qty 2

## 2023-05-31 MED ORDER — ONDANSETRON HCL 4 MG PO TABS: 4.0000 mg | ORAL_TABLET | Freq: Four times a day (QID) | ORAL | Status: DC | PRN

## 2023-05-31 MED ORDER — ADULT MULTIVITAMIN W/MINERALS CH
1.0000 | ORAL_TABLET | Freq: Every day | ORAL | Status: DC
  Administered 2023-05-31 – 2023-06-01 (×2): 1 via ORAL
  Filled 2023-05-31 (×2): qty 1

## 2023-05-31 MED ORDER — FOLIC ACID 1 MG PO TABS
1.0000 mg | ORAL_TABLET | Freq: Every day | ORAL | Status: DC
  Administered 2023-05-31 – 2023-06-01 (×2): 1 mg via ORAL
  Filled 2023-05-31 (×2): qty 1
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
## 2023-05-31 NOTE — Assessment & Plan Note (Signed)
Noted seizure event associated with alcohol withdrawal Patient denies any formal history of seizures in the past though does report 2 more episodes of seizures associated alcohol withdrawal CT head within normal limits Check EEG x 1 Case preliminarily discussed with Dr. Amada Jupiter with neurology Will defer formal neurology consult pending any abnormalities on EKG Follow

## 2023-05-31 NOTE — Assessment & Plan Note (Signed)
AST 70s, AST in the 40s Suspect secondary to alcohol use Check hepatitis panel x 1 Monitor
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
## 2023-05-31 NOTE — Assessment & Plan Note (Signed)
K 3.0  Replete Check Mg level
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
## 2023-06-01 ENCOUNTER — Ambulatory Visit: Payer: BC Managed Care – PPO

## 2023-06-01 DIAGNOSIS — F10930 Alcohol use, unspecified with withdrawal, uncomplicated: Secondary | ICD-10-CM

## 2023-06-01 DIAGNOSIS — F1010 Alcohol abuse, uncomplicated: Secondary | ICD-10-CM | POA: Diagnosis not present

## 2023-06-01 DIAGNOSIS — R569 Unspecified convulsions: Secondary | ICD-10-CM | POA: Diagnosis not present

## 2023-06-01 LAB — COMPREHENSIVE METABOLIC PANEL
ALT: 45 U/L — ABNORMAL HIGH (ref 0–44)
AST: 84 U/L — ABNORMAL HIGH (ref 15–41)
Albumin: 3.6 g/dL (ref 3.5–5.0)
Alkaline Phosphatase: 71 U/L (ref 38–126)
Anion gap: 13 (ref 5–15)
BUN: 16 mg/dL (ref 6–20)
CO2: 21 mmol/L — ABNORMAL LOW (ref 22–32)
Calcium: 8.1 mg/dL — ABNORMAL LOW (ref 8.9–10.3)
Chloride: 103 mmol/L (ref 98–111)
Creatinine, Ser: 0.92 mg/dL (ref 0.61–1.24)
GFR, Estimated: >60 mL/min (ref >60)
Glucose, Bld: 70 mg/dL (ref 70–99)
Potassium: 2.7 mmol/L — CL (ref 3.5–5.1)
Sodium: 137 mmol/L (ref 135–145)
Total Bilirubin: 2.1 mg/dL — ABNORMAL HIGH (ref 0.3–1.2)
Total Protein: 6.6 g/dL (ref 6.5–8.1)

## 2023-06-01 LAB — CBC
HCT: 34.4 % — ABNORMAL LOW (ref 39.0–52.0)
Hemoglobin: 12.5 g/dL — ABNORMAL LOW (ref 13.0–17.0)
MCH: 33.9 pg (ref 26.0–34.0)
MCHC: 36.3 g/dL — ABNORMAL HIGH (ref 30.0–36.0)
MCV: 93.2 fL (ref 80.0–100.0)
Platelets: 101 10*3/uL — ABNORMAL LOW (ref 150–400)
RBC: 3.69 MIL/uL — ABNORMAL LOW (ref 4.22–5.81)
RDW: 12 % (ref 11.5–15.5)
WBC: 4.3 10*3/uL (ref 4.0–10.5)
nRBC: 0.7 % — ABNORMAL HIGH (ref 0.0–0.2)

## 2023-06-01 LAB — MAGNESIUM: Magnesium: 1.7 mg/dL (ref 1.7–2.4)

## 2023-06-01 LAB — HEPATITIS PANEL, ACUTE
HCV Ab: NONREACTIVE
Hep A IgM: NONREACTIVE
Hep B C IgM: NONREACTIVE
Hepatitis B Surface Ag: NONREACTIVE

## 2023-06-01 LAB — PHOSPHORUS: Phosphorus: 2.2 mg/dL — ABNORMAL LOW (ref 2.5–4.6)

## 2023-06-01 MED ORDER — PHENOBARBITAL 32.4 MG PO TABS: 64.8000 mg | ORAL_TABLET | Freq: Three times a day (TID) | ORAL | Status: DC

## 2023-06-01 MED ORDER — POTASSIUM CHLORIDE CRYS ER 20 MEQ PO TBCR
40.0000 meq | EXTENDED_RELEASE_TABLET | Freq: Once | ORAL | Status: AC
  Administered 2023-06-01: 40 meq via ORAL
  Filled 2023-06-01: qty 2

## 2023-06-01 MED ORDER — LORAZEPAM 2 MG/ML IJ SOLN: 1.0000 mg | INTRAMUSCULAR | Status: DC | PRN

## 2023-06-01 MED ORDER — PHENOBARBITAL 32.4 MG PO TABS
97.2000 mg | ORAL_TABLET | Freq: Three times a day (TID) | ORAL | Status: DC
  Administered 2023-06-01: 97.2 mg via ORAL
  Filled 2023-06-01: qty 3

## 2023-06-01 MED ORDER — LORAZEPAM 1 MG PO TABS
1.0000 mg | ORAL_TABLET | ORAL | Status: DC | PRN
  Administered 2023-06-01: 1 mg via ORAL
  Filled 2023-06-01 (×2): qty 1

## 2023-06-01 MED ORDER — LORAZEPAM 2 MG/ML IJ SOLN
1.0000 mg | INTRAMUSCULAR | Status: DC | PRN
  Administered 2023-06-01: 2 mg via INTRAVENOUS
  Filled 2023-06-01: qty 1

## 2023-06-01 MED ORDER — PHENOBARBITAL 32.4 MG PO TABS: 32.4000 mg | ORAL_TABLET | Freq: Three times a day (TID) | ORAL | Status: DC

## 2023-06-01 MED ORDER — MAGNESIUM SULFATE 2 GM/50ML IV SOLN
2.0000 g | Freq: Once | INTRAVENOUS | Status: AC
  Administered 2023-06-01: 2 g via INTRAVENOUS
  Filled 2023-06-01: qty 50

## 2023-06-01 MED ORDER — DEXMEDETOMIDINE HCL IN NACL 200 MCG/50ML IV SOLN
0.2000 ug/kg/h | INTRAVENOUS | Status: DC
  Filled 2023-06-01: qty 50

## 2023-06-01 MED ORDER — K PHOS MONO-SOD PHOS DI & MONO 155-852-130 MG PO TABS
500.0000 mg | ORAL_TABLET | ORAL | Status: DC
  Administered 2023-06-01: 500 mg via ORAL
  Filled 2023-06-01 (×3): qty 2

## 2023-06-01 MED ORDER — K PHOS MONO-SOD PHOS DI & MONO 155-852-130 MG PO TABS
500.0000 mg | ORAL_TABLET | ORAL | Status: DC
  Administered 2023-06-01: 500 mg via ORAL
  Filled 2023-06-01 (×2): qty 2

## 2023-06-01 MED ORDER — LORAZEPAM 1 MG PO TABS
1.0000 mg | ORAL_TABLET | ORAL | Status: DC | PRN
  Administered 2023-06-01 (×2): 1 mg via ORAL
  Filled 2023-06-01: qty 1

## 2023-06-01 MED ORDER — BENAZEPRIL HCL 10 MG PO TABS
10.0000 mg | ORAL_TABLET | Freq: Every day | ORAL | Status: DC
  Administered 2023-06-01: 10 mg via ORAL
  Filled 2023-06-01: qty 1

## 2023-06-01 NOTE — Progress Notes (Signed)
Transferred to ICU bed 13. Call report on hold for 10 min. Nurse Selena Batten answered phone and was place on hold for another 2 min while this Nurse read chart. Patient needing drip.  Patient showed up to ICU while on hold. Patient has shirt, shorts, Wallet and keys with him in bed.

## 2023-06-01 NOTE — Consult Note (Signed)
   NAME:  Melvin Knight, MRN:  [id number], DOB:  22-Mar-1981, LOS: 0 ADMISSION DATE:  05/31/2023 CONSULTATION DATE:  06/01/2023 REFERRING MD:  Allena Katz - [facility] CHIEF COMPLAINT:  AMS, EtOH WD   PCCM Brief Note:   Hospitalist service requested for ICU transfer for Precedex initiation and phenobarbital in the setting of alcohol withdrawal. Patient arrived to ICU on foot without escort, confused and disheveled-appearing.  Patient immediately requested to leave AGAINST MEDICAL ADVICE.  Dr. Belia Heman and I spoke with patient and attempted to convince him to stay for treatment, he was willing to stay for potassium repletion but not willing to stay for any other treatments.  Patient spoke with his uncle on the phone who also tried to convince him to stay but patient was adamant.  Potassium chloride tablet was administered PO. and patient's IV removed prior to signing AMA paperwork.  Physical Examination: General: Acutely ill-appearing middle-aged man in NAD.  Appears anxious and tremulous. HEENT: [place]/AT, anicteric sclera, moist mucous membranes. Neuro: Awake, oriented x 3 (self, year, events; not oriented to location, thinks he is in [place] . Responds to verbal stimuli. Following commands consistently. Moves all 4 extremities spontaneously.  He is ambulatory. +Tremulousness versus asterixis. CV: RRR, no m/g/r. PULM: Breathing even and unlabored on RA. GI: Soft, nontender, nondistended. Extremities: No LE edema noted. Skin: Warm/dry, no rashes.  Patient completed AMA paperwork and was escorted to the hospital lobby.  Tim Lair, PA-C [facility] 06/01/23 2:47 PM  Please see [email or web address] for pager details.  From 7A-7P if no response, please call [phone] After hours, please call ELink [phone]
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
## 2023-06-01 NOTE — Progress Notes (Signed)
Patient brought to floor prior to completion of report. Before the patient could get placed in the room, he got off the bed and was walking away from the room to find the door.  I walked to his side and tried to guide him to room 13.  He informed me that he was not IVC and he wanted to go home and that we couldn't keep him here. I talked him into going to his room so I could take out his IV. At that point, Dr. Belia Heman came into the room and talked with the him. He let Dr. Belia Heman do a quick assessment and agreed to take potassium PO prior to leaving. I took out his IV, gave him PO potassium, and he was taking to the medical mall entrance via w/c so he wouldn't get lost. He stated he was going to call an uber to get a ride home. Patient signed AMA paperwork prior to leaving.

## 2023-06-01 NOTE — Progress Notes (Signed)
Patient came from EEG very disoriented, shaky, and sweating . Found walking toward 2A looking for shoes. Patient says, "they took me outside and put me on the lawn mower". Ativan given per CIWA protocol. MD notified.

## 2023-06-01 NOTE — Procedures (Signed)
History: 42 year old male with a history of alcohol abuse who stopped alcohol 2 days prior to admission and was seen to have a seizure  Sedation: None  Technique: This EEG was acquired with electrodes placed according to the International 10-20 electrode system (including Fp1, Fp2, F3, F4, C3, C4, P3, P4, O1, O2, T3, T4, T5, T6, A1, A2, Fz, Cz, Pz). The following electrodes were missing or displaced: none.   Background: The background consists of intermixed alpha and beta activities. There is a well defined posterior dominant rhythm of 9-10 hz that attenuates with eye opening. Sleep is not recorded.  There was no epileptiform activity or focal cerebral abnormality seen.  Photic stimulation: Physiologic driving is not performed  EEG Abnormalities: None  Clinical Interpretation: This normal EEG is recorded in the waking  state. There was no seizure or seizure predisposition recorded on this study. Please note that lack of epileptiform activity on EEG does not preclude the possibility of epilepsy.   Ritta Slot, MD [facility] [phone]  If 7pm- 7am, please page neurology on call as listed in AMION.
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
## 2023-06-01 NOTE — Progress Notes (Signed)
Eeg done 

## 2023-06-15 ENCOUNTER — Encounter: Payer: Self-pay | Admitting: Internal Medicine

## 2023-06-21 ENCOUNTER — Ambulatory Visit: Payer: BC Managed Care – PPO | Admitting: Internal Medicine
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
## 2023-06-28 ENCOUNTER — Ambulatory Visit: Payer: BC Managed Care – PPO | Admitting: Internal Medicine

## 2023-06-28 ENCOUNTER — Encounter: Payer: Self-pay | Admitting: Internal Medicine

## 2023-06-28 VITALS — BP 134/86 | HR 106 | Temp 98.0°F | Resp 18 | Ht 67.0 in | Wt 187.4 lb

## 2023-06-28 DIAGNOSIS — F1020 Alcohol dependence, uncomplicated: Secondary | ICD-10-CM | POA: Diagnosis not present

## 2023-07-04 ENCOUNTER — Ambulatory Visit: Payer: BC Managed Care – PPO | Admitting: Internal Medicine

## 2023-07-04 ENCOUNTER — Encounter: Payer: Self-pay | Admitting: Internal Medicine

## 2023-07-04 VITALS — BP 132/88 | HR 127 | Resp 18 | Ht 67.0 in | Wt 197.7 lb

## 2023-07-04 DIAGNOSIS — F1020 Alcohol dependence, uncomplicated: Secondary | ICD-10-CM | POA: Diagnosis not present

## 2023-07-04 DIAGNOSIS — F419 Anxiety disorder, unspecified: Secondary | ICD-10-CM

## 2023-07-04 LAB — CBC WITH DIFFERENTIAL/PLATELET
Absolute Monocytes: 462 cells/uL (ref 200–950)
Basophils Absolute: 28 cells/uL (ref 0–200)
Basophils Relative: 0.4 %
Eosinophils Absolute: 42 cells/uL (ref 15–500)
Eosinophils Relative: 0.6 %
HCT: 39.2 % (ref 38.5–50.0)
Hemoglobin: 13.7 g/dL (ref 13.2–17.1)
Lymphs Abs: 1666 cells/uL (ref 850–3900)
MCH: 33.8 pg — ABNORMAL HIGH (ref 27.0–33.0)
MCHC: 34.9 g/dL (ref 32.0–36.0)
MCV: 96.8 fL (ref 80.0–100.0)
MPV: 9.6 fL (ref 7.5–12.5)
Monocytes Relative: 6.6 %
Neutro Abs: 4802 cells/uL (ref 1500–7800)
Neutrophils Relative %: 68.6 %
Platelets: 169 10*3/uL (ref 140–400)
RBC: 4.05 10*6/uL — ABNORMAL LOW (ref 4.20–5.80)
RDW: 13.2 % (ref 11.0–15.0)
Total Lymphocyte: 23.8 %
WBC: 7 10*3/uL (ref 3.8–10.8)

## 2023-07-04 MED ORDER — ESCITALOPRAM OXALATE 20 MG PO TABS: 20.0000 mg | ORAL_TABLET | Freq: Every day | ORAL | 1 refills | Status: DC

## 2023-07-04 NOTE — Progress Notes (Signed)
fol
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
## 2023-08-29 ENCOUNTER — Inpatient Hospital Stay
Admission: EM | Admit: 2023-08-29 | Discharge: 2023-09-01 | DRG: 897 | Disposition: A | Discharge status: Home / Self Care | Payer: BC Managed Care – PPO | Attending: Student | Admitting: Student

## 2023-08-29 ENCOUNTER — Other Ambulatory Visit: Payer: Self-pay

## 2023-08-29 ENCOUNTER — Ambulatory Visit: Payer: BC Managed Care – PPO | Admitting: Internal Medicine

## 2023-08-29 DIAGNOSIS — R7401 Elevation of levels of liver transaminase levels: Secondary | ICD-10-CM | POA: Diagnosis present

## 2023-08-29 DIAGNOSIS — R000 Tachycardia, unspecified: Secondary | ICD-10-CM | POA: Diagnosis present

## 2023-08-29 DIAGNOSIS — Z720 Tobacco use: Secondary | ICD-10-CM | POA: Diagnosis present

## 2023-08-29 DIAGNOSIS — Z79899 Other long term (current) drug therapy: Secondary | ICD-10-CM

## 2023-08-29 DIAGNOSIS — D696 Thrombocytopenia, unspecified: Secondary | ICD-10-CM | POA: Diagnosis present

## 2023-08-29 DIAGNOSIS — Z6830 Body mass index (BMI) 30.0-30.9, adult: Secondary | ICD-10-CM

## 2023-08-29 DIAGNOSIS — Z8249 Family history of ischemic heart disease and other diseases of the circulatory system: Secondary | ICD-10-CM

## 2023-08-29 DIAGNOSIS — F419 Anxiety disorder, unspecified: Secondary | ICD-10-CM | POA: Diagnosis present

## 2023-08-29 DIAGNOSIS — F32A Depression, unspecified: Secondary | ICD-10-CM | POA: Diagnosis present

## 2023-08-29 DIAGNOSIS — E669 Obesity, unspecified: Secondary | ICD-10-CM | POA: Diagnosis present

## 2023-08-29 DIAGNOSIS — I10 Essential (primary) hypertension: Secondary | ICD-10-CM | POA: Diagnosis present

## 2023-08-29 DIAGNOSIS — E876 Hypokalemia: Secondary | ICD-10-CM | POA: Diagnosis present

## 2023-08-29 DIAGNOSIS — F10939 Alcohol use, unspecified with withdrawal, unspecified: Principal | ICD-10-CM | POA: Diagnosis present

## 2023-08-29 DIAGNOSIS — F17210 Nicotine dependence, cigarettes, uncomplicated: Secondary | ICD-10-CM | POA: Diagnosis present

## 2023-08-29 DIAGNOSIS — F10239 Alcohol dependence with withdrawal, unspecified: Principal | ICD-10-CM | POA: Diagnosis present

## 2023-08-29 DIAGNOSIS — R630 Anorexia: Secondary | ICD-10-CM | POA: Diagnosis present

## 2023-08-29 DIAGNOSIS — E878 Other disorders of electrolyte and fluid balance, not elsewhere classified: Secondary | ICD-10-CM | POA: Diagnosis present

## 2023-08-29 LAB — ETHANOL: Alcohol, Ethyl (B): <10 mg/dL (ref <10)

## 2023-08-29 LAB — MAGNESIUM: Magnesium: 1 mg/dL — ABNORMAL LOW (ref 1.7–2.4)

## 2023-08-29 LAB — COMPREHENSIVE METABOLIC PANEL
ALT: 69 U/L — ABNORMAL HIGH (ref 0–44)
AST: 114 U/L — ABNORMAL HIGH (ref 15–41)
Albumin: 4.7 g/dL (ref 3.5–5.0)
Alkaline Phosphatase: 98 U/L (ref 38–126)
Anion gap: 17 — ABNORMAL HIGH (ref 5–15)
BUN: <5 mg/dL — ABNORMAL LOW (ref 6–20)
CO2: 22 mmol/L (ref 22–32)
Calcium: 8.5 mg/dL — ABNORMAL LOW (ref 8.9–10.3)
Chloride: 98 mmol/L (ref 98–111)
Creatinine, Ser: 0.74 mg/dL (ref 0.61–1.24)
GFR, Estimated: >60 mL/min (ref >60)
Glucose, Bld: 128 mg/dL — ABNORMAL HIGH (ref 70–99)
Potassium: 2.7 mmol/L — CL (ref 3.5–5.1)
Sodium: 137 mmol/L (ref 135–145)
Total Bilirubin: 2 mg/dL — ABNORMAL HIGH (ref 0.3–1.2)
Total Protein: 8.5 g/dL — ABNORMAL HIGH (ref 6.5–8.1)

## 2023-08-29 LAB — CBC
Hemoglobin: 16.2 g/dL (ref 13.0–17.0)
Platelets: 108 10*3/uL — ABNORMAL LOW (ref 150–400)
WBC: 5.2 10*3/uL (ref 4.0–10.5)

## 2023-08-29 LAB — PHOSPHORUS: Phosphorus: 2.1 mg/dL — ABNORMAL LOW (ref 2.5–4.6)

## 2023-08-29 MED ORDER — LACTATED RINGERS IV BOLUS
1000.0000 mL | Freq: Once | INTRAVENOUS | Status: AC
  Administered 2023-08-29: 1000 mL via INTRAVENOUS

## 2023-08-29 MED ORDER — ESCITALOPRAM OXALATE 10 MG PO TABS
20.0000 mg | ORAL_TABLET | Freq: Every day | ORAL | Status: DC
  Administered 2023-08-30 – 2023-09-01 (×3): 20 mg via ORAL
  Filled 2023-08-29 (×3): qty 2

## 2023-08-29 MED ORDER — NICOTINE 21 MG/24HR TD PT24
21.0000 mg | MEDICATED_PATCH | Freq: Every day | TRANSDERMAL | Status: DC
  Administered 2023-08-29: 21 mg via TRANSDERMAL
  Filled 2023-08-29 (×3): qty 1

## 2023-08-29 MED ORDER — ADULT MULTIVITAMIN W/MINERALS CH
1.0000 | ORAL_TABLET | Freq: Every day | ORAL | Status: DC
  Administered 2023-08-29 – 2023-09-01 (×4): 1 via ORAL
  Filled 2023-08-29 (×4): qty 1

## 2023-08-29 MED ORDER — LORAZEPAM 1 MG PO TABS: 1.0000 mg | ORAL_TABLET | ORAL | Status: DC | PRN

## 2023-08-29 MED ORDER — POTASSIUM CHLORIDE 10 MEQ/100ML IV SOLN
10.0000 meq | Freq: Once | INTRAVENOUS | Status: AC
  Administered 2023-08-29: 10 meq via INTRAVENOUS
  Filled 2023-08-29: qty 100

## 2023-08-29 MED ORDER — LORAZEPAM 2 MG/ML IJ SOLN: 0.0000 mg | Freq: Two times a day (BID) | INTRAMUSCULAR | Status: DC

## 2023-08-29 MED ORDER — SODIUM CHLORIDE 0.9 % IV BOLUS
1000.0000 mL | Freq: Once | INTRAVENOUS | Status: AC
  Administered 2023-08-29: 1000 mL via INTRAVENOUS

## 2023-08-29 MED ORDER — VITAMIN B-12 100 MCG PO TABS
100.0000 ug | ORAL_TABLET | Freq: Every day | ORAL | Status: DC
  Administered 2023-08-30 – 2023-09-01 (×3): 100 ug via ORAL
  Filled 2023-08-29 (×3): qty 1

## 2023-08-29 MED ORDER — LORAZEPAM 1 MG PO TABS
1.0000 mg | ORAL_TABLET | Freq: Once | ORAL | Status: AC
  Administered 2023-08-29: 1 mg via ORAL
  Filled 2023-08-29: qty 1

## 2023-08-29 MED ORDER — CHLORDIAZEPOXIDE HCL 25 MG PO CAPS
25.0000 mg | ORAL_CAPSULE | Freq: Three times a day (TID) | ORAL | Status: DC
  Administered 2023-08-30 – 2023-08-31 (×5): 25 mg via ORAL
  Filled 2023-08-29 (×5): qty 1

## 2023-08-29 MED ORDER — IBUPROFEN 400 MG PO TABS: 200.0000 mg | ORAL_TABLET | Freq: Four times a day (QID) | ORAL | Status: DC | PRN

## 2023-08-29 MED ORDER — FOLIC ACID 1 MG PO TABS
1.0000 mg | ORAL_TABLET | Freq: Every day | ORAL | Status: DC
  Administered 2023-08-29 – 2023-09-01 (×4): 1 mg via ORAL
  Filled 2023-08-29 (×4): qty 1

## 2023-08-29 MED ORDER — THIAMINE MONONITRATE 100 MG PO TABS
100.0000 mg | ORAL_TABLET | Freq: Every day | ORAL | Status: DC
  Administered 2023-08-29 – 2023-09-01 (×4): 100 mg via ORAL
  Filled 2023-08-29 (×4): qty 1

## 2023-08-29 MED ORDER — HYDROXYZINE HCL 10 MG PO TABS: 10.0000 mg | ORAL_TABLET | Freq: Three times a day (TID) | ORAL | Status: DC | PRN

## 2023-08-29 MED ORDER — CHLORDIAZEPOXIDE HCL 25 MG PO CAPS
50.0000 mg | ORAL_CAPSULE | Freq: Once | ORAL | Status: AC
  Administered 2023-08-29: 50 mg via ORAL
  Filled 2023-08-29: qty 2

## 2023-08-29 MED ORDER — LORAZEPAM 2 MG/ML IJ SOLN: 2.0000 mg | INTRAMUSCULAR | Status: DC | PRN

## 2023-08-29 MED ORDER — MAGNESIUM SULFATE 4 GM/100ML IV SOLN
4.0000 g | Freq: Once | INTRAVENOUS | Status: AC
  Administered 2023-08-30: 4 g via INTRAVENOUS
  Filled 2023-08-29: qty 100

## 2023-08-29 MED ORDER — POTASSIUM PHOSPHATES 15 MMOLE/5ML IV SOLN
30.0000 mmol | Freq: Once | INTRAVENOUS | Status: AC
  Administered 2023-08-30: 30 mmol via INTRAVENOUS
  Filled 2023-08-29: qty 10

## 2023-08-29 MED ORDER — LORAZEPAM 2 MG/ML IJ SOLN
0.0000 mg | Freq: Four times a day (QID) | INTRAMUSCULAR | Status: AC
  Administered 2023-08-29 – 2023-08-30 (×2): 1 mg via INTRAVENOUS
  Filled 2023-08-29 (×2): qty 1

## 2023-08-29 MED ORDER — BENAZEPRIL-HYDROCHLOROTHIAZIDE 20-12.5 MG PO TABS: 1.0000 | ORAL_TABLET | Freq: Every day | ORAL | Status: DC

## 2023-08-29 MED ORDER — LORAZEPAM 2 MG/ML IJ SOLN: 1.0000 mg | INTRAMUSCULAR | Status: DC | PRN

## 2023-08-29 MED ORDER — ONDANSETRON HCL 4 MG/2ML IJ SOLN: 4.0000 mg | Freq: Three times a day (TID) | INTRAMUSCULAR | Status: DC | PRN

## 2023-08-29 MED ORDER — THIAMINE HCL 100 MG/ML IJ SOLN: 100.0000 mg | Freq: Every day | INTRAMUSCULAR | Status: DC

## 2023-08-29 MED ORDER — ENOXAPARIN SODIUM 60 MG/0.6ML IJ SOSY
0.5000 mg/kg | PREFILLED_SYRINGE | INTRAMUSCULAR | Status: DC
  Administered 2023-08-29 – 2023-08-31 (×3): 45 mg via SUBCUTANEOUS
  Filled 2023-08-29 (×3): qty 0.6

## 2023-08-29 MED ORDER — POTASSIUM CHLORIDE CRYS ER 20 MEQ PO TBCR
40.0000 meq | EXTENDED_RELEASE_TABLET | Freq: Once | ORAL | Status: AC
  Administered 2023-08-29: 40 meq via ORAL
  Filled 2023-08-29: qty 2

## 2023-08-29 MED ORDER — HYDRALAZINE HCL 20 MG/ML IJ SOLN: 5.0000 mg | INTRAMUSCULAR | Status: DC | PRN

## 2023-08-29 NOTE — ED Triage Notes (Signed)
Pt states here for ETOH detox, last drink 6pm last night. Hx of seizures

## 2023-08-29 NOTE — Progress Notes (Signed)
Anticoagulation monitoring(Lovenox):  42 yo male ordered Lovenox 40 mg Q24h    Filed Weights   08/29/23 1907  Weight: 88.5 kg (195 lb)   BMI 30.54   Lab Results  Component Value Date   CREATININE 0.74 08/29/2023   CREATININE 0.81 07/04/2023   CREATININE 0.92 06/01/2023   Estimated Creatinine Clearance: 131.7 mL/min (by C-G formula based on SCr of 0.74 mg/dL). Hemoglobin & Hematocrit     Component Value Date/Time   HGB 16.2 08/29/2023 1929   HCT RESULTS UNAVAILABLE DUE TO INTERFERING SUBSTANCE 08/29/2023 1929     Per Protocol for Patient with estCrcl > 30 ml/min and BMI > 30, will transition to Lovenox 45 mg Q24h.

## 2023-08-29 NOTE — ED Provider Notes (Signed)
[facility] Provider Note    Event Date/Time   First MD Initiated Contact with Patient 08/29/23 2013     (approximate)   History   detox   HPI Melvin Knight is a 42 y.o. male with alcohol use disorder complicated by seizures presenting today for detox.  Patient states recent relapse on alcohol and over the past 48 hours has had 750 mL of liquor.  No alcohol use today.  Comes in today for detox.  Notably shaky and agitated on exam with tachycardia.  Has had poor p.o. intake.  Prior history of seizures reportedly around alcohol withdrawal and required admission in the past for this as well.  States he does want to go back into rehab for assistance.     Physical Exam   Triage Vital Signs: ED Triage Vitals  Encounter Vitals Group     BP 08/29/23 1908 (!) 157/122     Systolic BP Percentile --      Diastolic BP Percentile --      Pulse Rate 08/29/23 1906 (!) 122     Resp 08/29/23 1906 (!) 24     Temp 08/29/23 1906 99.5 F (37.5 C)     Temp Source 08/29/23 1906 Oral     SpO2 08/29/23 1906 96 %     Weight 08/29/23 1907 195 lb (88.5 kg)     Height --      Head Circumference --      Peak Flow --      Pain Score 08/29/23 1900 0     Pain Loc --      Pain Education --      Exclude from Growth Chart --     Most recent vital signs: Vitals:   08/29/23 1908 08/29/23 1910  BP: (!) 157/122 (!) 157/122  Pulse:  (!) 122  Resp:    Temp:    SpO2:     Physical Exam: I have reviewed the vital signs and nursing notes. General: Awake, alert, no acute distress.  Jittery, anxious, and slightly diaphoretic on exam. Head:  Atraumatic, normocephalic.   ENT:  EOM intact, PERRL. Oral mucosa is pink and moist with no lesions. Neck: Neck is supple with full range of motion, No meningeal signs. Cardiovascular: Tachycardic, RR, No murmurs. Peripheral pulses palpable and equal bilaterally. Respiratory:  Symmetrical chest wall expansion.  No rhonchi, rales, or wheezes.   Good air movement throughout.  No use of accessory muscles.   Musculoskeletal:  No cyanosis or edema. Moving extremities with full ROM Abdomen:  Soft, nontender, nondistended. Neuro:  GCS 15, moving all four extremities, interacting appropriately. Speech clear. Psych: Anxious Skin:  Warm, dry, no rash.    ED Results / Procedures / Treatments   Labs (all labs ordered are listed, but only abnormal results are displayed) Labs Reviewed  COMPREHENSIVE METABOLIC PANEL - Abnormal; Notable for the following components:      Result Value   Potassium 2.7 (*)    Glucose, Bld 128 (*)    BUN <5 (*)    Calcium 8.5 (*)    Total Protein 8.5 (*)    AST 114 (*)    ALT 69 (*)    Total Bilirubin 2.0 (*)    Anion gap 17 (*)    All other components within normal limits  CBC - Abnormal; Notable for the following components:   Platelets 108 (*)    All other components within normal limits  ETHANOL  URINE DRUG SCREEN, QUALITATIVE (  [facility] ONLY)  MAGNESIUM  PHOSPHORUS  BASIC METABOLIC PANEL  CBC     EKG    RADIOLOGY    PROCEDURES:  Critical Care performed: No  Procedures   MEDICATIONS ORDERED IN ED: Medications  thiamine (VITAMIN B1) tablet 100 mg (has no administration in time range)    Or  thiamine (VITAMIN B1) injection 100 mg (has no administration in time range)  folic acid (FOLVITE) tablet 1 mg (has no administration in time range)  multivitamin with minerals tablet 1 tablet (has no administration in time range)  chlordiazePOXIDE (LIBRIUM) capsule 50 mg (has no administration in time range)  sodium chloride 0.9 % bolus 1,000 mL (has no administration in time range)  chlordiazePOXIDE (LIBRIUM) capsule 25 mg (has no administration in time range)  ondansetron (ZOFRAN) injection 4 mg (has no administration in time range)  hydrALAZINE (APRESOLINE) injection 5 mg (has no administration in time range)  LORazepam (ATIVAN) tablet 1-4 mg (has no administration in time range)    Or   LORazepam (ATIVAN) injection 1-4 mg (has no administration in time range)  LORazepam (ATIVAN) injection 0-4 mg (has no administration in time range)    Followed by  LORazepam (ATIVAN) injection 0-4 mg (has no administration in time range)  ibuprofen (ADVIL) tablet 200 mg (has no administration in time range)  nicotine (NICODERM CQ - dosed in mg/24 hours) patch 21 mg (has no administration in time range)  LORazepam (ATIVAN) injection 2 mg (has no administration in time range)  enoxaparin (LOVENOX) injection 45 mg (has no administration in time range)  LORazepam (ATIVAN) tablet 1 mg (1 mg Oral Given 08/29/23 1928)  lactated ringers bolus 1,000 mL (1,000 mLs Intravenous New Bag/Given 08/29/23 2039)  potassium chloride 10 mEq in 100 mL IVPB (0 mEq Intravenous Stopped 08/29/23 2154)  potassium chloride SA (KLOR-CON M) CR tablet 40 mEq (40 mEq Oral Given 08/29/23 2032)     IMPRESSION / MDM / ASSESSMENT AND PLAN / ED COURSE  I reviewed the triage vital signs and the nursing notes.                              Differential diagnosis includes, but is not limited to, alcohol withdrawal.  Patient's presentation is most consistent with acute presentation with potential threat to life or bodily function.  Patient is a 42 year old male presenting today for alcohol withdrawal symptoms.  On arrival, he is tachycardic, diaphoretic, anxious, and tremulous.  Ativan was given.  Patient has complicated alcohol withdrawal history with prior seizures.  Placed on monitor.  Laboratory workup notable for hypokalemia.  Patient was given 1 L fluids along with IV and oral potassium repletion.  Improvement in symptoms afterwards.  Given high risk features with prior alcohol withdrawal symptoms, will admit patient to hospitalist for medical management of withdrawal symptoms.  The patient is on the cardiac monitor to evaluate for evidence of arrhythmia and/or significant heart rate changes. Clinical Course as of 08/29/23  2155  Mon Aug 29, 2023  2051 Comprehensive metabolic panel(!!) Notable hypokalemia.  Transaminitis comparable to prior history.  Elevated lactic likely in the setting of starvation ketosis. [DW]    Clinical Course User Index [DW] Janith Lima, MD     FINAL CLINICAL IMPRESSION(S) / ED DIAGNOSES   Final diagnoses:  Alcohol withdrawal syndrome with complication (HCC)  Hypokalemia     Rx / DC Orders   ED Discharge Orders     None  Note:  This document was prepared using Dragon voice recognition software and may include unintentional dictation errors.   Janith Lima, MD 08/29/23 2156

## 2023-08-29 NOTE — H&P (Signed)
History and Physical    Melvin Knight ZOX:[id number] DOB: Sep 28, 1981 DOA: 08/29/2023  Referring MD/NP/PA:   PCP: Margarita Mail, DO   Patient coming from:  The patient is coming from home.     Chief Complaint: Alcohol withdrawal  HPI: Melvin Knight is a 42 y.o. male with medical history significant of tobacco abuse, alcohol abuse, hypertension, obesity, depression with anxiety, thrombocytopenia, seizure, hypokalemic periodic paralysis, who presents with alcohol withdrawal.  Patient states recent relapse on alcohol and over the past 48 hours has had 750 mL of liquor.  No alcohol use today.  He developed tremulous.  He is notably shaky.  He reports poor appetite and decreased oral intake.  No fall. States he does want to go back into rehab for assistance.  Patient does not have chest pain, cough, SOB.  No nausea, vomiting, diarrhea or abdominal pain.  Denies symptoms of UTI.  Data reviewed independently and ED Course: pt was found to have WBC 5.2, magnesium 1.1, phosphorus of 2.1, potassium 2.7, temperature 99.5, blood pressure 157/122, heart rate of 122, RR 24, oxygen sat 96% room air.  Patient is placed on telemetry bed for observation.   EKG:   Not done in ED, will get one.    Review of Systems:   General: no fevers, chills, no body weight gain, has poor appetite, has fatigue HEENT: no blurry vision, hearing changes or sore throat Respiratory: no dyspnea, coughing, wheezing CV: no chest pain, no palpitations GI: no nausea, vomiting, abdominal pain, diarrhea, constipation GU: no dysuria, burning on urination, increased urinary frequency, hematuria  Ext: no leg edema Neuro: no unilateral weakness, numbness, or tingling, no vision change or hearing loss Skin: no rash, no skin tear. MSK: No muscle spasm, no deformity, no limitation of range of movement in spin Heme: No easy bruising.  Travel history: No recent long distant travel.   Allergy: No Known Allergies  Past Medical  History:  Diagnosis Date   AKI (acute kidney injury) (HCC) 09/24/2022   Anxiety    Hypertension    Hypokalemia    Hypokalemia 09/24/2022   Hypomagnesemia 09/24/2022    History reviewed. No pertinent surgical history.  Social History:  reports that he has been smoking cigarettes. He has never used smokeless tobacco. He reports current alcohol use of about 6.0 standard drinks of alcohol per week. He reports that he does not currently use drugs.  Family History:  Family History  Problem Relation Age of Onset   Hypertension Father    Stroke Father    Cancer Sister      Prior to Admission medications   Medication Sig Start Date End Date Taking? Authorizing Provider  benazepril-hydrochlorthiazide (LOTENSIN HCT) 20-12.5 MG tablet Take 1 tablet by mouth daily.    [provider]  escitalopram (LEXAPRO) 20 MG tablet Take 1 tablet (20 mg total) by mouth daily. 07/04/23   Margarita Mail, DO  folic acid (FOLVITE) 1 MG tablet Take 1 tablet (1 mg total) by mouth daily. 09/26/22   Pennie Banter, DO  hydrOXYzine (ATARAX) 10 MG tablet Take 1 tablet (10 mg total) by mouth 3 (three) times daily as needed for anxiety. 03/17/23   Margarita Mail, DO  Multiple Vitamins-Minerals (MULTIVITAMIN WITH MINERALS) tablet Take 1 tablet by mouth daily. 07/06/22   Margarita Mail, DO  thiamine (VITAMIN B-1) 100 MG tablet Take 1 tablet (100 mg total) by mouth daily. 09/26/22   Pennie Banter, DO  vitamin B-12 (CYANOCOBALAMIN) 100 MCG tablet Take 100 mcg  by mouth daily.    [provider]    Physical Exam: Vitals:   08/29/23 1906 08/29/23 1907 08/29/23 1908 08/29/23 1910  BP:   (!) 157/122 (!) 157/122  Pulse: (!) 122   (!) 122  Resp: (!) 24     Temp: 99.5 F (37.5 C)     TempSrc: Oral     SpO2: 96%     Weight:  88.5 kg     General: Not in acute distress .  Patient is tremulous HEENT:       Eyes: PERRL, EOMI, no jaundice       ENT: No discharge from the ears and nose, no  pharynx injection, no tonsillar enlargement.        Neck: No JVD, no bruit, no mass felt. Heme: No neck lymph node enlargement. Cardiac: S1/S2, RRR, No murmurs, No gallops or rubs. Respiratory: No rales, wheezing, rhonchi or rubs. GI: Soft, nondistended, nontender, no rebound pain, no organomegaly, BS present. GU: No hematuria Ext: No pitting leg edema bilaterally. 1+DP/PT pulse bilaterally. Musculoskeletal: No joint deformities, No joint redness or warmth, no limitation of ROM in spin. Skin: No rashes.  Neuro: Alert, oriented X3, cranial nerves II-XII grossly intact, moves all extremities normally.  Labs on Admission: I have personally reviewed following labs and imaging studies  CBC: Recent Labs  Lab 08/29/23 1929  WBC 5.2  HGB 16.2  HCT RESULTS UNAVAILABLE DUE TO INTERFERING SUBSTANCE  MCV RESULTS UNAVAILABLE DUE TO INTERFERING SUBSTANCE  PLT 108*   Basic Metabolic Panel: Recent Labs  Lab 08/29/23 1929  NA 137  K 2.7*  CL 98  CO2 22  GLUCOSE 128*  BUN <5*  CREATININE 0.74  CALCIUM 8.5*  MG 1.0*  PHOS 2.1*   GFR: Estimated Creatinine Clearance: 131.7 mL/min (by C-G formula based on SCr of 0.74 mg/dL). Liver Function Tests: Recent Labs  Lab 08/29/23 1929  AST 114*  ALT 69*  ALKPHOS 98  BILITOT 2.0*  PROT 8.5*  ALBUMIN 4.7   No results for input(s): "LIPASE", "AMYLASE" in the last 168 hours. No results for input(s): "AMMONIA" in the last 168 hours. Coagulation Profile: No results for input(s): "INR", "PROTIME" in the last 168 hours. Cardiac Enzymes: No results for input(s): "CKTOTAL", "CKMB", "CKMBINDEX", "TROPONINI" in the last 168 hours. BNP (last 3 results) No results for input(s): "PROBNP" in the last 8760 hours. HbA1C: No results for input(s): "HGBA1C" in the last 72 hours. CBG: No results for input(s): "GLUCAP" in the last 168 hours. Lipid Profile: No results for input(s): "CHOL", "HDL", "LDLCALC", "TRIG", "CHOLHDL", "LDLDIRECT" in the last 72  hours. Thyroid Function Tests: No results for input(s): "TSH", "T4TOTAL", "FREET4", "T3FREE", "THYROIDAB" in the last 72 hours. Anemia Panel: No results for input(s): "VITAMINB12", "FOLATE", "FERRITIN", "TIBC", "IRON", "RETICCTPCT" in the last 72 hours. Urine analysis:    Component Value Date/Time   COLORURINE AMBER (A) 09/24/2022 0051   APPEARANCEUR CLOUDY (A) 09/24/2022 0051   LABSPEC 1.025 09/24/2022 0051   PHURINE 5.0 09/24/2022 0051   GLUCOSEU NEGATIVE 09/24/2022 0051   HGBUR NEGATIVE 09/24/2022 0051   BILIRUBINUR NEGATIVE 09/24/2022 0051   KETONESUR 5 (A) 09/24/2022 0051   PROTEINUR 100 (A) 09/24/2022 0051   NITRITE NEGATIVE 09/24/2022 0051   LEUKOCYTESUR NEGATIVE 09/24/2022 0051   Sepsis Labs: @LABRCNTIP (procalcitonin:4,lacticidven:4) )No results found for this or any previous visit (from the past 240 hour(s)).   Radiological Exams on Admission: No results found.    Assessment/Plan Principal Problem:   Alcohol withdrawal (HCC)  Active Problems:   Tobacco abuse   Electrolyte disturbance   Hypokalemia   Hypomagnesemia   Hypophosphatemia   HTN (hypertension)   Thrombocytopenia (HCC)   Transaminitis   Anxiety and depression   Obesity (BMI 30-39.9)   Assessment and Plan:   Alcohol withdrawal [facility]): Patient is tremulous, currently no seizure.  Patient has history of seizure. -Placed on telemetry bed for observation -CIWA protocol started -Did counseling about importance of quitting alcohol abuse -Consult TOC  Tobacco abuse: -Nicotine patch -Did counseling about importance of quitting tobacco  Electrolyte disturbance including hypokalemia, hypomagnesemia and hypophosphatemia: Potassium 2.7, magnesium 1.0, phosphorus 2.1 -Repleted potassium, phosphorus and magnesium -Consulted pharmacy for electrolytes disturbance  HTN (hypertension) -Lotensin-HCTZ -IV hydralazine as needed  Thrombocytopenia (HCC): This is chronic issue.  Platelets 108, likely due to  alcohol abuse -Follow-up with CBC  Transaminitis: AST 114, ALT 69, total bili 2.0.  No abdominal pain.  Likely due to alcohol abuse -Avoid using Tylenol  Anxiety and depression -Continue home medications  Obesity (BMI 30-39.9): Body weight 88.5 kg, BMI 30.54 -Encourage losing weight -Exercise and healthy diet       DVT ppx: SQ Lovenox  Code Status: Full code     Family Communication: not done, no family member is at bed side.    Disposition Plan:  Anticipate discharge back to previous environment  Consults called:  none  Admission status and Level of care: Telemetry Medical:     for obs    Dispo: The patient is from: Home              Anticipated d/c is to: Home              Anticipated d/c date is: 1 day              Patient currently is not medically stable to d/c.    Severity of Illness:  The appropriate patient status for this patient is OBSERVATION. Observation status is judged to be reasonable and necessary in order to provide the required intensity of service to ensure the patient's safety. The patient's presenting symptoms, physical exam findings, and initial radiographic and laboratory data in the context of their medical condition is felt to place them at decreased risk for further clinical deterioration. Furthermore, it is anticipated that the patient will be medically stable for discharge from the hospital within 2 midnights of admission.        Date of Service 08/29/2023    Lorretta Harp [facility]   If 7PM-7AM, please contact night-coverage [email or web address] 08/29/2023, 11:36 PM

## 2023-08-29 NOTE — ED Triage Notes (Signed)
Pt comes via EMS from home with c/o detox. Pt states last drink was 5 days ago. Pt states he normally drinks about 4-5 liquor drinks. Pt states no pain. Pt states some nausea and hasn't ate in few days. Pt states  tremors and that he did take some med for anxiety at home but not sure what it was.

## 2023-08-30 ENCOUNTER — Encounter: Payer: Self-pay | Admitting: Internal Medicine

## 2023-08-30 LAB — URINE DRUG SCREEN, QUALITATIVE (ARMC ONLY)
Amphetamines, Ur Screen: NOT DETECTED
Barbiturates, Ur Screen: NOT DETECTED
Benzodiazepine, Ur Scrn: NOT DETECTED
Cannabinoid 50 Ng, Ur ~~LOC~~: NOT DETECTED
Cocaine Metabolite,Ur ~~LOC~~: NOT DETECTED
MDMA (Ecstasy)Ur Screen: NOT DETECTED
Methadone Scn, Ur: NOT DETECTED
Opiate, Ur Screen: NOT DETECTED
Phencyclidine (PCP) Ur S: NOT DETECTED
Tricyclic, Ur Screen: NOT DETECTED

## 2023-08-30 LAB — CBC
HCT: 42.7 % (ref 39.0–52.0)
Hemoglobin: 15.5 g/dL (ref 13.0–17.0)
MCH: 34.7 pg — ABNORMAL HIGH (ref 26.0–34.0)
MCHC: 36.3 g/dL — ABNORMAL HIGH (ref 30.0–36.0)
MCV: 95.5 fL (ref 80.0–100.0)
Platelets: 84 10*3/uL — ABNORMAL LOW (ref 150–400)
RBC: 4.47 MIL/uL (ref 4.22–5.81)
RDW: 12.5 % (ref 11.5–15.5)
WBC: 3.5 10*3/uL — ABNORMAL LOW (ref 4.0–10.5)
nRBC: 0 % (ref 0.0–0.2)

## 2023-08-30 LAB — BASIC METABOLIC PANEL
Anion gap: 12 (ref 5–15)
BUN: <5 mg/dL — ABNORMAL LOW (ref 6–20)
CO2: 26 mmol/L (ref 22–32)
Calcium: 8.1 mg/dL — ABNORMAL LOW (ref 8.9–10.3)
Chloride: 99 mmol/L (ref 98–111)
Creatinine, Ser: 0.74 mg/dL (ref 0.61–1.24)
GFR, Estimated: >60 mL/min (ref >60)
Glucose, Bld: 102 mg/dL — ABNORMAL HIGH (ref 70–99)
Potassium: 3 mmol/L — ABNORMAL LOW (ref 3.5–5.1)
Sodium: 137 mmol/L (ref 135–145)

## 2023-08-30 LAB — MAGNESIUM: Magnesium: 2.5 mg/dL — ABNORMAL HIGH (ref 1.7–2.4)

## 2023-08-30 LAB — POTASSIUM: Potassium: 3.1 mmol/L — ABNORMAL LOW (ref 3.5–5.1)

## 2023-08-30 MED ORDER — MAGNESIUM SULFATE 4 GM/100ML IV SOLN
4.0000 g | Freq: Once | INTRAVENOUS | Status: AC
  Administered 2023-08-30: 4 g via INTRAVENOUS
  Filled 2023-08-30: qty 100

## 2023-08-30 MED ORDER — ORAL CARE MOUTH RINSE: 15.0000 mL | OROMUCOSAL | Status: DC | PRN

## 2023-08-30 MED ORDER — POTASSIUM CHLORIDE 10 MEQ/100ML IV SOLN
10.0000 meq | INTRAVENOUS | Status: AC
  Administered 2023-08-30 (×3): 10 meq via INTRAVENOUS
  Filled 2023-08-30 (×3): qty 100

## 2023-08-30 MED ORDER — POTASSIUM CHLORIDE CRYS ER 20 MEQ PO TBCR
40.0000 meq | EXTENDED_RELEASE_TABLET | Freq: Once | ORAL | Status: AC
  Administered 2023-08-30: 40 meq via ORAL
  Filled 2023-08-30: qty 2

## 2023-08-30 NOTE — Progress Notes (Signed)
PHARMACY CONSULT NOTE - ELECTROLYTES  Pharmacy Consult for Electrolyte Monitoring and Replacement   Recent Labs: Height: 5\' 7"  (170.2 cm) Weight: 88 kg (194 lb 0.1 oz) IBW/kg (Calculated) : 66.1 Estimated Creatinine Clearance: 131.3 mL/min (by C-G formula based on SCr of 0.74 mg/dL). Potassium (mmol/L)  Date Value  08/30/2023 3.1 (L)   Magnesium (mg/dL)  Date Value  98/09/9146 2.5 (H)   Calcium (mg/dL)  Date Value  82/95/6213 8.1 (L)   Albumin (g/dL)  Date Value  08/65/7846 4.7   Phosphorus (mg/dL)  Date Value  96/29/5284 2.1 (L)   Sodium (mmol/L)  Date Value  08/30/2023 137   Assessment  Melvin Knight is a 42 y.o. male presenting with alcohol withdrawal. PMH significant for tobacco use, AUD, HTN, obesity, depression, and anxiety. Pharmacy has been consulted to monitor and replace electrolytes.  Diet: clear, thin liquids MIVF: N/A Pertinent medications: N/A  Goal of Therapy: Electrolytes WNL  Plan:  Give KCL 40 mEq x1 Check BMP, Mg, Phos with AM labs  Thank you for allowing pharmacy to be a part of this patient's care.  Rockwell Alexandria, PharmD Clinical Pharmacist 08/30/2023 1:34 PM

## 2023-08-30 NOTE — Discharge Instructions (Signed)
Resources added to AVS.

## 2023-08-30 NOTE — ED Notes (Signed)
Pt needed to be shaved to get clear ECG

## 2023-08-30 NOTE — ED Notes (Signed)
Pt ambulatory to restroom

## 2023-08-30 NOTE — Progress Notes (Signed)
PHARMACY CONSULT NOTE - FOLLOW UP  Pharmacy Consult for Electrolyte Monitoring and Replacement   Recent Labs: Potassium (mmol/L)  Date Value  08/29/2023 2.7 (LL)   Magnesium (mg/dL)  Date Value  16/08/9603 1.0 (L)   Calcium (mg/dL)  Date Value  54/07/8118 8.5 (L)   Albumin (g/dL)  Date Value  14/78/2956 4.7   Phosphorus (mg/dL)  Date Value  21/30/8657 2.1 (L)   Sodium (mmol/L)  Date Value  08/29/2023 137     Assessment: 10/8 @ 1929:  K = 2.7,   Phos = 2.1,  Magnesium = 1.0  Goal of Therapy:  Electrolytes WNL   Plan:  Mag 4 mg IVPB X 2 ordered. KPhos 30 mmol IV X 1  KCl 40 mEq PO X 1 + KCl 10 mEq IV X 4   - Will recheck electrolytes on 10/8 with AM labs   Jaydyn Menon D ,PharmD Clinical Pharmacist 08/30/2023 1:17 AM
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
## 2023-08-30 NOTE — Plan of Care (Signed)

## 2023-08-30 NOTE — Progress Notes (Signed)
PROGRESS NOTE    Melvin Knight  WUJ:[id number] DOB: 1981/02/05 DOA: 08/29/2023 PCP: Margarita Mail, DO    Brief Narrative:  42 y.o. male with medical history significant of tobacco abuse, alcohol abuse, hypertension, obesity, depression with anxiety, thrombocytopenia, seizure, hypokalemic periodic paralysis, who presents with alcohol withdrawal.   Patient states recent relapse on alcohol and over the past 48 hours has had 750 mL of liquor.  No alcohol use today.  He developed tremulous.  He is notably shaky.  He reports poor appetite and decreased oral intake.  No fall. States he does want to go back into rehab for assistance.  Patient does not have chest pain, cough, SOB.  No nausea, vomiting, diarrhea or abdominal pain.  Denies symptoms of UTI.   Assessment & Plan:   Principal Problem:   Alcohol withdrawal (HCC) Active Problems:   Tobacco abuse   Electrolyte disturbance   Hypokalemia   Hypomagnesemia   Hypophosphatemia   HTN (hypertension)   Thrombocytopenia (HCC)   Transaminitis   Anxiety and depression   Obesity (BMI 30-39.9) Alcohol withdrawal (HCC): Patient is tremulous, currently no seizure.  Patient has a history of alcohol withdrawal seizures Plan: CIWA protocol Scheduled [facility] TOC consult for substance abuse resources Monitoring and optimization of electrolytes  Tobacco abuse: -Nicotine patch -Did counseling about importance of quitting tobacco   Electrolyte disturbance including hypokalemia, hypomagnesemia and hypophosphatemia:  Potassium 2.7, magnesium 1.0, phosphorus 2.1 -Repleted potassium, phosphorus and magnesium -Consulted pharmacy for electrolytes disturbance   HTN (hypertension) -Lotensin-HCTZ -IV hydralazine as needed   Thrombocytopenia (HCC):  .This is chronic issue.  Platelets 108, likely due to alcohol abuse -Follow-up with CBC   Transaminitis: AST 114, ALT 69, total bili 2.0.  No abdominal pain.  Likely due to alcohol abuse -Avoid  using Tylenol   Anxiety and depression -Continue home medications   Obesity (BMI 30-39.9): Body weight 88.5 kg, BMI 30.54 -Encourage losing weight -Exercise and healthy diet    DVT prophylaxis: Lovenox Code Status: Full Family Communication: None Disposition Plan: Status is: Inpatient Remains inpatient appropriate because: Acute alcohol withdrawal   Level of care: Telemetry Medical  Consultants:  None  Procedures:  None  Antimicrobials: None  Subjective: Seen and examined in ED hallway.  Reports improvement in symptoms since initiation of intravenous fluids.  Mildly tremulous.  Mentating clearly.  Objective: Vitals:   08/30/23 0800 08/30/23 0934 08/30/23 1000 08/30/23 1322  BP:  (!) 142/106 (!) 136/94 (!) 150/113  Pulse:  93 88 93  Resp:   18 18  Temp:   98.6 F (37 C) 98.8 F (37.1 C)  TempSrc:   Oral   SpO2:   98% 97%  Weight:    88 kg  Height: 5\' 7"  (1.702 m)   5\' 7"  (1.702 m)   No intake or output data in the 24 hours ending 08/30/23 1526 Filed Weights   08/29/23 1907 08/30/23 1322  Weight: 88.5 kg 88 kg    Examination:  General exam: Appears tremulous and fatigued Respiratory system: Clear to auscultation. Respiratory effort normal. Cardiovascular system: S1-S2, tachycardic, regular rhythm, no murmurs, no pedal edema Gastrointestinal system: Soft, NT/ND, normal bowel sounds Central nervous system: Alert and oriented. No focal neurological deficits. Extremities: Symmetric 5 x 5 power. Skin: No rashes, lesions or ulcers Psychiatry: Judgement and insight appear normal. Mood & affect appropriate.     Data Reviewed: I have personally reviewed following labs and imaging studies  CBC: Recent Labs  Lab 08/29/23 1929 08/30/23 9562  WBC 5.2 3.5*  HGB 16.2 15.5  HCT RESULTS UNAVAILABLE DUE TO INTERFERING SUBSTANCE 42.7  MCV RESULTS UNAVAILABLE DUE TO INTERFERING SUBSTANCE 95.5  PLT 108* 84*   Basic Metabolic Panel: Recent Labs  Lab  08/29/23 1929 08/30/23 0603 08/30/23 1306  NA 137 137  --   K 2.7* 3.0* 3.1*  CL 98 99  --   CO2 22 26  --   GLUCOSE 128* 102*  --   BUN <5* <5*  --   CREATININE 0.74 0.74  --   CALCIUM 8.5* 8.1*  --   MG 1.0*  --  2.5*  PHOS 2.1*  --   --    GFR: Estimated Creatinine Clearance: 131.3 mL/min (by C-G formula based on SCr of 0.74 mg/dL). Liver Function Tests: Recent Labs  Lab 08/29/23 1929  AST 114*  ALT 69*  ALKPHOS 98  BILITOT 2.0*  PROT 8.5*  ALBUMIN 4.7   No results for input(s): "LIPASE", "AMYLASE" in the last 168 hours. No results for input(s): "AMMONIA" in the last 168 hours. Coagulation Profile: No results for input(s): "INR", "PROTIME" in the last 168 hours. Cardiac Enzymes: No results for input(s): "CKTOTAL", "CKMB", "CKMBINDEX", "TROPONINI" in the last 168 hours. BNP (last 3 results) No results for input(s): "PROBNP" in the last 8760 hours. HbA1C: No results for input(s): "HGBA1C" in the last 72 hours. CBG: No results for input(s): "GLUCAP" in the last 168 hours. Lipid Profile: No results for input(s): "CHOL", "HDL", "LDLCALC", "TRIG", "CHOLHDL", "LDLDIRECT" in the last 72 hours. Thyroid Function Tests: No results for input(s): "TSH", "T4TOTAL", "FREET4", "T3FREE", "THYROIDAB" in the last 72 hours. Anemia Panel: No results for input(s): "VITAMINB12", "FOLATE", "FERRITIN", "TIBC", "IRON", "RETICCTPCT" in the last 72 hours. Sepsis Labs: No results for input(s): "PROCALCITON", "LATICACIDVEN" in the last 168 hours.  No results found for this or any previous visit (from the past 240 hour(s)).       Radiology Studies: No results found.      Scheduled Meds:  chlordiazePOXIDE  25 mg Oral TID   enoxaparin (LOVENOX) injection  0.5 mg/kg Subcutaneous Q24H   escitalopram  20 mg Oral Daily   folic acid  1 mg Oral Daily   LORazepam  0-4 mg Intravenous Q6H   Followed by   Melene Muller ON 08/31/2023] LORazepam  0-4 mg Intravenous Q12H   multivitamin with  minerals  1 tablet Oral Daily   nicotine  21 mg Transdermal Daily   potassium chloride  40 mEq Oral Once   thiamine  100 mg Oral Daily   Or   thiamine  100 mg Intravenous Daily   vitamin B-12  100 mcg Oral Daily   Continuous Infusions:   LOS: 0 days     Tresa Moore, MD [facility]   If 7PM-7AM, please contact night-coverage  08/30/2023, 3:26 PM

## 2023-08-31 LAB — BASIC METABOLIC PANEL
Anion gap: 10 (ref 5–15)
BUN: 9 mg/dL (ref 6–20)
CO2: 23 mmol/L (ref 22–32)
Calcium: 8.4 mg/dL — ABNORMAL LOW (ref 8.9–10.3)
Chloride: 103 mmol/L (ref 98–111)
Creatinine, Ser: 0.72 mg/dL (ref 0.61–1.24)
GFR, Estimated: >60 mL/min (ref >60)
Glucose, Bld: 132 mg/dL — ABNORMAL HIGH (ref 70–99)
Potassium: 3.3 mmol/L — ABNORMAL LOW (ref 3.5–5.1)
Sodium: 136 mmol/L (ref 135–145)

## 2023-08-31 LAB — MAGNESIUM: Magnesium: 2.1 mg/dL (ref 1.7–2.4)

## 2023-08-31 LAB — PHOSPHORUS: Phosphorus: 2.9 mg/dL (ref 2.5–4.6)

## 2023-08-31 LAB — VITAMIN D 25 HYDROXY (VIT D DEFICIENCY, FRACTURES): Vit D, 25-Hydroxy: 26.83 ng/mL — ABNORMAL LOW (ref 30–100)

## 2023-08-31 MED ORDER — POTASSIUM CHLORIDE CRYS ER 20 MEQ PO TBCR: 20.0000 meq | EXTENDED_RELEASE_TABLET | Freq: Once | ORAL | Status: DC

## 2023-08-31 MED ORDER — CHLORDIAZEPOXIDE HCL 25 MG PO CAPS: 25.0000 mg | ORAL_CAPSULE | Freq: Every day | ORAL | Status: DC

## 2023-08-31 MED ORDER — CHLORDIAZEPOXIDE HCL 25 MG PO CAPS
25.0000 mg | ORAL_CAPSULE | Freq: Two times a day (BID) | ORAL | Status: AC
  Administered 2023-08-31: 25 mg via ORAL
  Filled 2023-08-31: qty 1

## 2023-08-31 MED ORDER — POTASSIUM CHLORIDE CRYS ER 20 MEQ PO TBCR
40.0000 meq | EXTENDED_RELEASE_TABLET | Freq: Once | ORAL | Status: AC
  Administered 2023-08-31: 40 meq via ORAL
  Filled 2023-08-31: qty 2

## 2023-08-31 MED ORDER — LOSARTAN POTASSIUM 50 MG PO TABS
50.0000 mg | ORAL_TABLET | Freq: Every day | ORAL | Status: DC
  Administered 2023-08-31 – 2023-09-01 (×2): 50 mg via ORAL
  Filled 2023-08-31 (×2): qty 1

## 2023-08-31 NOTE — Progress Notes (Signed)
PHARMACY CONSULT NOTE - ELECTROLYTES  Pharmacy Consult for Electrolyte Monitoring and Replacement   Recent Labs: Height: 5\' 7"  (170.2 cm) Weight: 88 kg (194 lb 0.1 oz) IBW/kg (Calculated) : 66.1 Estimated Creatinine Clearance: 131.3 mL/min (by C-G formula based on SCr of 0.72 mg/dL). Potassium (mmol/L)  Date Value  08/31/2023 3.3 (L)   Magnesium (mg/dL)  Date Value  40/98/1191 2.1   Calcium (mg/dL)  Date Value  47/82/9562 8.4 (L)   Albumin (g/dL)  Date Value  13/06/6577 4.7   Phosphorus (mg/dL)  Date Value  46/96/2952 2.9   Sodium (mmol/L)  Date Value  08/31/2023 136   Assessment  Melvin Knight is a 42 y.o. male presenting with alcohol withdrawal. PMH significant for tobacco use, AUD, HTN, obesity, depression, and anxiety. Pharmacy has been consulted to monitor and replace electrolytes.  Diet: clear, thin liquids MIVF: N/A Pertinent medications: N/A  Goal of Therapy: Electrolytes WNL  Plan:  Give KCL 20 mEq x1 Check BMP with AM labs  Thank you for allowing pharmacy to be a part of this patient's care.  Merryl Hacker, PharmD Clinical Pharmacist 08/31/2023 7:09 AM

## 2023-08-31 NOTE — Progress Notes (Signed)
Introduced patient to role of Statistician. Patient known to navigator from previous admissions. 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 Patient has limited social/family support.  Patient DOES have established PCP. Does NOT have a mental health provider. Patient IS agreeable to referral to [facility]. Per request, patient's information given to Lafonda Mosses, intake coordinator, who will be in touch with patient. When asked about recent alcohol relapse, patient states he "mostly" started drinking again "out of boredom." Patient then went on to say he had lost his job at [employer] as a Lawyer. Patient does endorse upcoming start date at a new job on Friday.  Denies any SDOH needs at present.

## 2023-08-31 NOTE — Progress Notes (Signed)
[facility] Progress Note  Patient: Melvin Knight    ZWC:[id number]  DOA: 08/29/2023     Date of Service: the patient was seen and examined on 08/31/2023  Chief Complaint  Patient presents with   detox   Brief hospital course: 42 y.o. male with medical history significant of tobacco abuse, alcohol abuse, hypertension, obesity, depression with anxiety, thrombocytopenia, seizure, hypokalemic periodic paralysis, who presents with alcohol withdrawal.   Patient states recent relapse on alcohol and over the past 48 hours has had 750 mL of liquor.  No alcohol use today.  He developed tremulous.  He is notably shaky.  He reports poor appetite and decreased oral intake.  No fall. States he does want to go back into rehab for assistance.  Patient does not have chest pain, cough, SOB.  No nausea, vomiting, diarrhea or abdominal pain.  Denies symptoms of UTI.   Assessment and Plan: Alcohol withdrawal (HCC):  No tremors today, seems to be stable after starting Librium. Plan: CIWA protocol Scheduled Librium TID, decreased to BID today for 2 doses.  TOC consult for substance abuse resources Monitoring and optimization of electrolytes   Tobacco abuse: -Nicotine patch -Did counseling about importance of quitting tobacco   Electrolyte disturbance including hypokalemia, hypomagnesemia and hypophosphatemia:  Potassium 2.7, magnesium 1.0, phosphorus 2.1 -Repleted potassium, phosphorus and magnesium -Consulted pharmacy for electrolytes disturbance   HTN (hypertension) -Lotensin-HCTZ -IV hydralazine as needed 10/9 started losartan 50 mg p.o. daily Monitor BP and titrate medications accordingly  Thrombocytopenia (HCC):  .This is chronic issue.  Platelets 108, likely due to alcohol abuse -Follow-up with CBC   Transaminitis: AST 114, ALT 69, total bili 2.0.  No abdominal pain.  Likely due to alcohol abuse -Avoid using Tylenol   Anxiety and depression -Continue home medications    Body  mass index is 30.39 kg/m.  Interventions: -Encourage losing weight -Exercise and healthy diet  Diet: Regular diet DVT Prophylaxis: Subcutaneous Lovenox   Advance goals of care discussion: Full code  Family Communication: family was not present at bedside, at the time of interview.  The pt provided permission to discuss medical plan with the family. Opportunity was given to ask question and all questions were answered satisfactorily.   Disposition:  Pt is from home, admitted with EtOH intoxication and withdrawal symptoms, electrolyte imbalance.  Gradually getting better, still has hypertension and low potassium, on Librium tapering dose.  Possible discharge tomorrow a.m. Discharge to home most likely tomorrow a.m.  Subjective: No significant events overnight, patient was resting comfortable.  Denies any withdrawal symptoms, no tremors, no headache or dizziness, no chest pain, no shortness of breath.  Physical Exam: General: NAD, lying comfortably Appear in no distress, affect appropriate Eyes: PERRLA ENT: Oral Mucosa Clear, moist  Neck: no JVD,  Cardiovascular: S1 and S2 Present, no Murmur,  Respiratory: good respiratory effort, Bilateral Air entry equal and Decreased, no Crackles, no wheezes Abdomen: Bowel Sound present, Soft and no tenderness,  Skin: no rashes Extremities: no Pedal edema, no calf tenderness Neurologic: without any new focal findings Gait not checked due to patient safety concerns  Vitals:   08/31/23 0148 08/31/23 0425 08/31/23 0730 08/31/23 1109  BP: (!) 159/120 (!) 163/109 (!) 160/125 136/88  Pulse: 80 81 75 70  Resp:  18 18 18   Temp: 98.7 F (37.1 C) 99.8 F (37.7 C)  98.7 F (37.1 C)  TempSrc: Oral Oral    SpO2: 99% 97% 97% 98%  Weight:      Height:  No intake or output data in the 24 hours ending 08/31/23 1352 Filed Weights   08/29/23 1907 08/30/23 1322  Weight: 88.5 kg 88 kg    Data Reviewed: I have personally reviewed and interpreted  daily labs, tele strips, imagings as discussed above. I reviewed all nursing notes, pharmacy notes, vitals, pertinent old records I have discussed plan of care as described above with RN and patient/family.  CBC: Recent Labs  Lab 08/29/23 1929 08/30/23 0603  WBC 5.2 3.5*  HGB 16.2 15.5  HCT RESULTS UNAVAILABLE DUE TO INTERFERING SUBSTANCE 42.7  MCV RESULTS UNAVAILABLE DUE TO INTERFERING SUBSTANCE 95.5  PLT 108* 84*   Basic Metabolic Panel: Recent Labs  Lab 08/29/23 1929 08/30/23 0603 08/30/23 1306 08/31/23 0428  NA 137 137  --  136  K 2.7* 3.0* 3.1* 3.3*  CL 98 99  --  103  CO2 22 26  --  23  GLUCOSE 128* 102*  --  132*  BUN <5* <5*  --  9  CREATININE 0.74 0.74  --  0.72  CALCIUM 8.5* 8.1*  --  8.4*  MG 1.0*  --  2.5* 2.1  PHOS 2.1*  --   --  2.9    Studies: No results found.  Scheduled Meds:  chlordiazePOXIDE  25 mg Oral TID   enoxaparin (LOVENOX) injection  0.5 mg/kg Subcutaneous Q24H   escitalopram  20 mg Oral Daily   folic acid  1 mg Oral Daily   LORazepam  0-4 mg Intravenous Q6H   Followed by   LORazepam  0-4 mg Intravenous Q12H   losartan  50 mg Oral Daily   multivitamin with minerals  1 tablet Oral Daily   nicotine  21 mg Transdermal Daily   thiamine  100 mg Oral Daily   Or   thiamine  100 mg Intravenous Daily   vitamin B-12  100 mcg Oral Daily   Continuous Infusions: PRN Meds: hydrALAZINE, hydrOXYzine, ibuprofen, LORazepam **OR** LORazepam, LORazepam, ondansetron (ZOFRAN) IV, mouth rinse  Time spent: 35 minutes  Author: Gillis Santa. MD [facility] 08/31/2023 1:52 PM  To reach On-call, see care teams to locate the attending and reach out to them via [email or web address]. If 7PM-7AM, please contact night-coverage If you still have difficulty reaching the attending provider, please page the [facility] (Director on Call) for [facility] on amion for assistance.

## 2023-09-01 LAB — BASIC METABOLIC PANEL
Anion gap: 10 (ref 5–15)
BUN: 13 mg/dL (ref 6–20)
CO2: 23 mmol/L (ref 22–32)
Calcium: 9.1 mg/dL (ref 8.9–10.3)
Chloride: 103 mmol/L (ref 98–111)
Creatinine, Ser: 0.77 mg/dL (ref 0.61–1.24)
GFR, Estimated: >60 mL/min (ref >60)
Glucose, Bld: 120 mg/dL — ABNORMAL HIGH (ref 70–99)
Potassium: 3.5 mmol/L (ref 3.5–5.1)
Sodium: 136 mmol/L (ref 135–145)

## 2023-09-01 LAB — HEPATIC FUNCTION PANEL
ALT: 72 U/L — ABNORMAL HIGH (ref 0–44)
AST: 100 U/L — ABNORMAL HIGH (ref 15–41)
Albumin: 3.7 g/dL (ref 3.5–5.0)
Alkaline Phosphatase: 119 U/L (ref 38–126)
Bilirubin, Direct: 0.2 mg/dL (ref 0.0–0.2)
Indirect Bilirubin: 0.9 mg/dL (ref 0.3–0.9)
Total Bilirubin: 1.1 mg/dL (ref 0.3–1.2)
Total Protein: 7.2 g/dL (ref 6.5–8.1)

## 2023-09-01 LAB — CBC
HCT: 38.2 % — ABNORMAL LOW (ref 39.0–52.0)
Hemoglobin: 14 g/dL (ref 13.0–17.0)
MCH: 34.5 pg — ABNORMAL HIGH (ref 26.0–34.0)
MCHC: 36.6 g/dL — ABNORMAL HIGH (ref 30.0–36.0)
MCV: 94.1 fL (ref 80.0–100.0)
Platelets: 90 10*3/uL — ABNORMAL LOW (ref 150–400)
RBC: 4.06 MIL/uL — ABNORMAL LOW (ref 4.22–5.81)
RDW: 12.1 % (ref 11.5–15.5)
WBC: 4 10*3/uL (ref 4.0–10.5)
nRBC: 0 % (ref 0.0–0.2)

## 2023-09-01 LAB — MAGNESIUM: Magnesium: 1.8 mg/dL (ref 1.7–2.4)

## 2023-09-01 LAB — PHOSPHORUS: Phosphorus: 3.3 mg/dL (ref 2.5–4.6)

## 2023-09-01 MED ORDER — VITAMIN B-1 100 MG PO TABS: 100.0000 mg | ORAL_TABLET | Freq: Every day | ORAL | 0 refills | Status: AC

## 2023-09-01 MED ORDER — MAGNESIUM SULFATE 2 GM/50ML IV SOLN
2.0000 g | Freq: Once | INTRAVENOUS | Status: AC
  Administered 2023-09-01: 2 g via INTRAVENOUS
  Filled 2023-09-01: qty 50

## 2023-09-01 MED ORDER — HYDRALAZINE HCL 50 MG PO TABS: 50.0000 mg | ORAL_TABLET | Freq: Three times a day (TID) | ORAL | 1 refills | Status: DC | PRN

## 2023-09-01 MED ORDER — VITAMIN D (ERGOCALCIFEROL) 1.25 MG (50000 UNIT) PO CAPS
50000.0000 [IU] | ORAL_CAPSULE | ORAL | Status: DC
  Administered 2023-09-01: 50000 [IU] via ORAL
  Filled 2023-09-01: qty 1

## 2023-09-01 MED ORDER — HYDRALAZINE HCL 50 MG PO TABS
50.0000 mg | ORAL_TABLET | Freq: Four times a day (QID) | ORAL | Status: DC | PRN
  Administered 2023-09-01: 50 mg via ORAL
  Filled 2023-09-01: qty 1

## 2023-09-01 MED ORDER — LOSARTAN POTASSIUM 50 MG PO TABS: 50.0000 mg | ORAL_TABLET | Freq: Every day | ORAL | 3 refills | Status: DC

## 2023-09-01 MED ORDER — ESCITALOPRAM OXALATE 20 MG PO TABS
20.0000 mg | ORAL_TABLET | Freq: Every day | ORAL | 3 refills | Status: DC
Start: 2023-09-01 — End: 2023-12-21

## 2023-09-01 MED ORDER — POTASSIUM CHLORIDE 20 MEQ PO PACK: 40.0000 meq | PACK | Freq: Once | ORAL | Status: DC

## 2023-09-01 MED ORDER — POTASSIUM CHLORIDE CRYS ER 20 MEQ PO TBCR
40.0000 meq | EXTENDED_RELEASE_TABLET | Freq: Once | ORAL | Status: AC
  Administered 2023-09-01: 40 meq via ORAL
  Filled 2023-09-01: qty 2

## 2023-09-01 MED ORDER — VITAMIN D (ERGOCALCIFEROL) 1.25 MG (50000 UNIT) PO CAPS: 50000.0000 [IU] | ORAL_CAPSULE | ORAL | 0 refills | Status: AC

## 2023-09-01 NOTE — Progress Notes (Signed)
PHARMACY CONSULT NOTE - ELECTROLYTES  Pharmacy Consult for Electrolyte Monitoring and Replacement   Recent Labs: Height: 5\' 7"  (170.2 cm) Weight: 88 kg (194 lb 0.1 oz) IBW/kg (Calculated) : 66.1 Estimated Creatinine Clearance: 131.3 mL/min (by C-G formula based on SCr of 0.77 mg/dL). Potassium (mmol/L)  Date Value  09/01/2023 3.5   Magnesium (mg/dL)  Date Value  56/21/3086 1.8   Calcium (mg/dL)  Date Value  57/84/6962 9.1   Albumin (g/dL)  Date Value  95/28/4132 3.7   Phosphorus (mg/dL)  Date Value  44/11/270 3.3   Sodium (mmol/L)  Date Value  09/01/2023 136   Assessment  Johntavious Francom is a 42 y.o. male presenting with alcohol withdrawal. PMH significant for tobacco use, AUD, HTN, obesity, depression, and anxiety. Pharmacy has been consulted to monitor and replace electrolytes.  Diet: clear, thin liquids MIVF: N/A Pertinent medications: N/A  Goal of Therapy: Electrolytes WNL  Plan:  Give Mag sulfate 2 g IV x 1 Check K with AM labs to ensure levels remain WNL Check Mag with AM labs  Thank you for allowing pharmacy to be a part of this patient's care.  Merryl Hacker, PharmD Clinical Pharmacist 09/01/2023 7:09 AM

## 2023-09-01 NOTE — Plan of Care (Signed)

## 2023-09-01 NOTE — Discharge Summary (Signed)
[facility] Discharge Summary   Patient: Melvin Knight WNU:[id number]  PCP: Margarita Mail, DO  Date of admission: 08/29/2023   Date of discharge:  09/01/2023     Discharge Diagnoses:  Principal Problem:   Alcohol withdrawal (HCC) Active Problems:   Tobacco abuse   Electrolyte disturbance   Hypokalemia   Hypomagnesemia   Hypophosphatemia   HTN (hypertension)   Thrombocytopenia (HCC)   Transaminitis   Anxiety and depression   Obesity (BMI 30-39.9)   Admitted From: Home Disposition:  Home   Recommendations for Outpatient Follow-up:  F/u with PCP in 1 wk F/u with Psych in 1-2 wks Follow up LABS/TEST: CBC and CMP after few weeks   Diet recommendation: Cardiac diet  Activity: The patient is advised to gradually reintroduce usual activities, as tolerated  Discharge Condition: stable  Code Status: Full code   History of present illness: As per the H and P dictated on admission Hospital Course:  42 y.o. male with medical history significant of tobacco abuse, alcohol abuse, hypertension, obesity, depression with anxiety, thrombocytopenia, seizure, hypokalemic periodic paralysis, who presents with alcohol withdrawal.   Patient states recent relapse on alcohol and over the past 48 hours has had 750 mL of liquor.  No alcohol use today.  He developed tremulous.  He is notably shaky.  He reports poor appetite and decreased oral intake.  No fall. States he does want to go back into rehab for assistance.  Patient does not have chest pain, cough, SOB.  No nausea, vomiting, diarrhea or abdominal pain.  Denies symptoms of UTI.   Assessment and Plan: # Alcohol withdrawal:  No tremors today, seems to be stable after starting Librium. S/p CIWA protocol and Scheduled Librium TID, decreased to BID x 2 doses. TOC consulted for substance abuse resources.  Improved, denied any withdrawal symptoms.  Patient feels comfortable going home today. Alcohol abstinence counseling done. #  Tobacco abuse: Nicotine patch was offered during hospital stay. Did counseling about importance of quitting tobacco # Electrolyte disturbance including hypokalemia, hypomagnesemia and hypophosphatemia: Potassium 2.7, magnesium 1.0, phosphorus 2.1 Repleted potassium, phosphorus and magnesium. -Consulted pharmacy for electrolytes disturbance.  Resolved, currently all electrolytes within normal range. # HTN (hypertension), s/p losartan -HCTZ. On 10/9 started losartan 50 mg p.o. daily.  Discontinued hydrochlorothiazide due to electrolyte imbalance.  Patient was advised to monitor BP at home and follow with PCP to titrate medication accordingly. # Thrombocytopenia: This is chronic issue.  Platelets 108-90, likely due to alcohol abuse.  Follow-up with PCP to repeat CBC after few weeks. # Transaminitis: AST 114, ALT 69, total bili 2.0.  No abdominal pain.  Likely due to alcohol abuse. Avoid using Tylenol.  Repeat CMP after few weeks.  Alcohol abstinence counseling done. # Anxiety and depression: Continued Lexapro 20 mg p.o. daily home dose and Atarax as needed for anxiety.  Patient was advised to follow-up with psych in few weeks as an outpatient for further management. # Vitamin D insufficiency, started vitamin D 50,000 units p.o. weekly.  Follow with PCP to repeat vitamin D level after 3 to 6 months.  Body mass index is 30.39 kg/m.  Nutrition Interventions:  - Patient was instructed, not to drive, operate heavy machinery, perform activities at heights, swimming or participation in water activities or provide baby sitting services while on Pain, Sleep and Anxiety Medications; until his outpatient Physician has advised to do so again.  - Also recommended to not to take more than prescribed Pain, Sleep and Anxiety Medications.  Patient  was ambulatory without any assistance. On the day of the discharge the patient's vitals were stable, and no other acute medical condition were reported by patient. the  patient was felt safe to be discharge at Home.  Consultants: Psychiatrist Procedures: None  Discharge Exam: General: Appear in no distress, no Rash; Oral Mucosa Clear, moist. Cardiovascular: S1 and S2 Present, no Murmur, Respiratory: normal respiratory effort, Bilateral Air entry present and no Crackles, no wheezes Abdomen: Bowel Sound present, Soft and no tenderness, no hernia Extremities: no Pedal edema, no calf tenderness Neurology: alert and oriented to time, place, and person affect appropriate.  Filed Weights   08/29/23 1907 08/30/23 1322  Weight: 88.5 kg 88 kg   Vitals:   09/01/23 0546 09/01/23 0823  BP: (!) 155/116 (!) 168/110  Pulse: (!) 59 62  Resp: 18 18  Temp: 98.2 F (36.8 C) 99 F (37.2 C)  SpO2: 99% 94%    DISCHARGE MEDICATION: Allergies as of 09/01/2023   No Known Allergies      Medication List     STOP taking these medications    benazepril-hydrochlorthiazide 20-12.5 MG tablet Commonly known as: LOTENSIN HCT       TAKE these medications    escitalopram 20 MG tablet Commonly known as: Lexapro Take 1 tablet (20 mg total) by mouth daily.   folic acid 1 MG tablet Commonly known as: FOLVITE Take 1 tablet (1 mg total) by mouth daily.   hydrALAZINE 50 MG tablet Commonly known as: APRESOLINE Take 1 tablet (50 mg total) by mouth 3 (three) times daily as needed (If systolic BP greater than 150 mmHg.). If systolic BP greater than 150 mmHg.   hydrOXYzine 10 MG tablet Commonly known as: ATARAX Take 1 tablet (10 mg total) by mouth 3 (three) times daily as needed for anxiety.   losartan 50 MG tablet Commonly known as: COZAAR Take 1 tablet (50 mg total) by mouth daily. Start taking on: September 02, 2023   multivitamin with minerals tablet Take 1 tablet by mouth daily.   thiamine 100 MG tablet Commonly known as: Vitamin B-1 Take 1 tablet (100 mg total) by mouth daily.   vitamin B-12 100 MCG tablet Commonly known as: CYANOCOBALAMIN Take 100  mcg by mouth daily.   Vitamin D (Ergocalciferol) 1.25 MG (50000 UNIT) Caps capsule Commonly known as: DRISDOL Take 1 capsule (50,000 Units total) by mouth every 7 (seven) days. Start taking on: September 08, 2023       No Known Allergies Discharge Instructions     Call MD for:  difficulty breathing, headache or visual disturbances   Complete by: As directed    Call MD for:  extreme fatigue   Complete by: As directed    Call MD for:  persistant dizziness or light-headedness   Complete by: As directed    Call MD for:  persistant nausea and vomiting   Complete by: As directed    Call MD for:  severe uncontrolled pain   Complete by: As directed    Call MD for:  temperature >100.4   Complete by: As directed    Diet - low sodium heart healthy   Complete by: As directed    Discharge instructions   Complete by: As directed    F/u with PCP in 1 wk F/u with Psych in 1-2 wks   Increase activity slowly   Complete by: As directed    No wound care   Complete by: As directed  The results of significant diagnostics from this hospitalization (including imaging, microbiology, ancillary and laboratory) are listed below for reference.    Significant Diagnostic Studies: No results found.  Microbiology: No results found for this or any previous visit (from the past 240 hour(s)).   Labs: CBC: Recent Labs  Lab 08/29/23 1929 08/30/23 0603 09/01/23 0500  WBC 5.2 3.5* 4.0  HGB 16.2 15.5 14.0  HCT RESULTS UNAVAILABLE DUE TO INTERFERING SUBSTANCE 42.7 38.2*  MCV RESULTS UNAVAILABLE DUE TO INTERFERING SUBSTANCE 95.5 94.1  PLT 108* 84* 90*   Basic Metabolic Panel: Recent Labs  Lab 08/29/23 1929 08/30/23 0603 08/30/23 1306 08/31/23 0428 09/01/23 0500  NA 137 137  --  136 136  K 2.7* 3.0* 3.1* 3.3* 3.5  CL 98 99  --  103 103  CO2 22 26  --  23 23  GLUCOSE 128* 102*  --  132* 120*  BUN <5* <5*  --  9 13  CREATININE 0.74 0.74  --  0.72 0.77  CALCIUM 8.5* 8.1*  --  8.4* 9.1   MG 1.0*  --  2.5* 2.1 1.8  PHOS 2.1*  --   --  2.9 3.3   Liver Function Tests: Recent Labs  Lab 08/29/23 1929 09/01/23 0500  AST 114* 100*  ALT 69* 72*  ALKPHOS 98 119  BILITOT 2.0* 1.1  PROT 8.5* 7.2  ALBUMIN 4.7 3.7   No results for input(s): "LIPASE", "AMYLASE" in the last 168 hours. No results for input(s): "AMMONIA" in the last 168 hours. Cardiac Enzymes: No results for input(s): "CKTOTAL", "CKMB", "CKMBINDEX", "TROPONINI" in the last 168 hours. BNP (last 3 results) No results for input(s): "BNP" in the last 8760 hours. CBG: No results for input(s): "GLUCAP" in the last 168 hours.  Time spent: 35 minutes  Signed:  Gillis Santa  [facility] 09/01/2023 10:26 AM

## 2023-09-01 NOTE — Progress Notes (Signed)
Per Dr Dwyane Dee, dc tele monitoring

## 2023-09-02 ENCOUNTER — Telehealth: Payer: Self-pay

## 2023-09-02 NOTE — Telephone Encounter (Signed)
Pt is calling to back HFU scheduled 09/06/23 with Dr. Caralee Ates.

## 2023-09-02 NOTE — Transitions of Care (Post Inpatient/ED Visit) (Signed)
   09/02/2023  Name: Melvin Knight MRN: [id number] DOB: 1981/03/02  Today's TOC FU Call Status: Today's TOC FU Call Status:: Successful TOC FU Call Completed TOC FU Call Complete Date: 09/02/23 Patient's Name and Date of Birth confirmed.  Transition Care Management Follow-up Telephone Call Date of Discharge: 09/01/23 Discharge Facility: [facility]) Type of Discharge: Inpatient Admission Primary Inpatient Discharge Diagnosis:: alcohol abuse How have you been since you were released from the hospital?: Better Any questions or concerns?: No  Items Reviewed: Did you receive and understand the discharge instructions provided?: Yes Medications obtained,verified, and reconciled?: Yes (Medications Reviewed) Any new allergies since your discharge?: No Dietary orders reviewed?: Yes Do you have support at home?: No  Medications Reviewed Today: Medications Reviewed Today     Reviewed by Karena Addison, LPN (Licensed Practical Nurse) on 09/02/23 at 1001  Med List Status: <None>   Medication Order Taking? Sig Documenting Provider Last Dose Status Informant  escitalopram (LEXAPRO) 20 MG tablet [id number]  Take 1 tablet (20 mg total) by mouth daily. Gillis Santa, MD  Active   folic acid (FOLVITE) 1 MG tablet [id number] No Take 1 tablet (1 mg total) by mouth daily. Pennie Banter, DO Unknown Unknown Active Self  hydrALAZINE (APRESOLINE) 50 MG tablet [id number]  Take 1 tablet (50 mg total) by mouth 3 (three) times daily as needed (If systolic BP greater than 150 mmHg.). If systolic BP greater than 150 mmHg. Gillis Santa, MD  Active   hydrOXYzine (ATARAX) 10 MG tablet [id number] No Take 1 tablet (10 mg total) by mouth 3 (three) times daily as needed for anxiety. Margarita Mail, DO prn prn Active Self  losartan (COZAAR) 50 MG tablet [id number]  Take 1 tablet (50 mg total) by mouth daily. Gillis Santa, MD  Active   Multiple Vitamins-Minerals (MULTIVITAMIN WITH  MINERALS) tablet [id number] No Take 1 tablet by mouth daily. Margarita Mail, DO Unknown Unknown Active Self  thiamine (VITAMIN B-1) 100 MG tablet [id number]  Take 1 tablet (100 mg total) by mouth daily. Gillis Santa, MD  Active   vitamin B-12 (CYANOCOBALAMIN) 100 MCG tablet [id number] No Take 100 mcg by mouth daily. [provider] Unknown Unknown Active Self  Vitamin D, Ergocalciferol, (DRISDOL) 1.25 MG (50000 UNIT) CAPS capsule [id number]  Take 1 capsule (50,000 Units total) by mouth every 7 (seven) days. Gillis Santa, MD  Active             Home Care and Equipment/Supplies: Were Home Health Services Ordered?: NA Any new equipment or medical supplies ordered?: NA  Functional Questionnaire: Do you need assistance with bathing/showering or dressing?: No Do you need assistance with meal preparation?: No Do you need assistance with eating?: No Do you have difficulty maintaining continence: No Do you need assistance with getting out of bed/getting out of a chair/moving?: No Do you have difficulty managing or taking your medications?: No  Follow up appointments reviewed: PCP Follow-up appointment confirmed?: No (no avai appts, sent message to staff to schedule) MD Provider Line [phone] Given: No Specialist Hospital Follow-up appointment confirmed?: No Reason Specialist Follow-Up Not Confirmed: Patient has Specialist Provider Number and will Call for Appointment Do you need transportation to your follow-up appointment?: No Do you understand care options if your condition(s) worsen?: Yes-patient verbalized understanding    SIGNATURE Karena Addison, LPN [facility] Nurse Health Advisor Direct Dial [phone]

## 2023-09-05 ENCOUNTER — Telehealth: Payer: Self-pay | Admitting: *Deleted

## 2023-09-05 NOTE — Transitions of Care (Post Inpatient/ED Visit) (Signed)
   09/05/2023  Name: Melvin Knight MRN: [id number] DOB: [birth year]  Today's TOC FU Call Status: Today's TOC FU Call Status:: Unsuccessful Call (1st Attempt) Unsuccessful Call (1st Attempt) Date: 09/05/23  Attempted to reach the patient regarding the most recent Inpatient/ED visit.  Follow Up Plan: Additional outreach attempts will be made to reach the patient to complete the Transitions of Care (Post Inpatient/ED visit) call.   Gean Maidens BSN RN [facility] Care Management [phone]

## 2023-09-06 ENCOUNTER — Telehealth: Payer: Self-pay | Admitting: *Deleted

## 2023-09-06 ENCOUNTER — Ambulatory Visit: Payer: Self-pay | Admitting: Internal Medicine

## 2023-09-06 ENCOUNTER — Encounter: Payer: Self-pay | Admitting: Internal Medicine

## 2023-09-06 VITALS — BP 146/92 | HR 111 | Temp 97.9°F | Resp 18 | Ht 67.0 in | Wt 186.8 lb

## 2023-09-06 DIAGNOSIS — R21 Rash and other nonspecific skin eruption: Secondary | ICD-10-CM

## 2023-09-06 DIAGNOSIS — F1020 Alcohol dependence, uncomplicated: Secondary | ICD-10-CM

## 2023-09-06 DIAGNOSIS — Z23 Encounter for immunization: Secondary | ICD-10-CM

## 2023-09-06 DIAGNOSIS — Z09 Encounter for follow-up examination after completed treatment for conditions other than malignant neoplasm: Secondary | ICD-10-CM

## 2023-09-06 DIAGNOSIS — D696 Thrombocytopenia, unspecified: Secondary | ICD-10-CM

## 2023-09-06 MED ORDER — HYDROCORTISONE 1 % EX OINT
1.0000 | TOPICAL_OINTMENT | Freq: Two times a day (BID) | CUTANEOUS | 0 refills | Status: DC
Start: 2023-09-06 — End: 2023-12-21

## 2023-09-06 NOTE — Progress Notes (Signed)
Established Patient Office Visit  Subjective:  Patient ID: Melvin Knight, male    DOB: [birth year]  Age: 42 y.o. MRN: [id number]  CC:  Chief Complaint  Patient presents with   Hospitalization Follow-up    HPI Melvin Knight presents for hospital follow up.  Discharge Date: 09/01/23 Diagnosis: alcohol withdrawal, electrolyte disturbances, thrombocytopenia  Procedures/tests: WBC 3.5, platelets 84, k 3.3 Consultants: None New medications: electrolytes repleted  Discontinued medications: None Discharge instructions:  Recheck labs Status: better   Hypertension: -Medications: Hydralazine 50 mg TID PRN, had been on Benazepril-HCTZ 20-12.5 mg, Metoprolol 25 mg daily - per patient once he stopped drinking he was becoming light headed in rehab, so all of his blood pressure medications were discontinued. -Denies any SOB, CP, vision changes, LE edema but does occasionally get lightheaded.   Elevated LFTs/Alcohol Use Disorder: -Liver function 10/24 AST 100, ALT 72 -He is currently on a multivitamin and B complex vitamin, as well as folate and thiamine  -Last drink 08/28/23 -Now following with Psychiatry, just started on Naltrexone 50 mg which he has not started yet  Stress/MDD: -Mood status: Exacerbated  -Current treatment: Lexapro 20 mg daily    Vitamin D Deficiency: -Now on 50,000 international units  weekly  Past Medical History:  Diagnosis Date   AKI (acute kidney injury) (HCC) 09/24/2022   Anxiety    Hypertension    Hypokalemia    Hypokalemia 09/24/2022   Hypomagnesemia 09/24/2022    History reviewed. No pertinent surgical history.  Family History  Problem Relation Age of Onset   Hypertension Father    Stroke Father    Cancer Sister     Social History   Socioeconomic History   Marital status: Single    Spouse name: Not on file   Number of children: Not on file   Years of education: Not on file   Highest education level: Bachelor's degree (e.g., BA, AB, BS)   Occupational History   Not on file  Tobacco Use   Smoking status: Every Day    Current packs/day: 1.00    Types: Cigarettes   Smokeless tobacco: Never  Vaping Use   Vaping status: Never Used  Substance and Sexual Activity   Alcohol use: Yes    Alcohol/week: 6.0 standard drinks of alcohol    Types: 6 Shots of liquor per week    Comment: 6 cocktails per day   Drug use: Not Currently   Sexual activity: Not Currently  Other Topics Concern   Not on file  Social History Narrative   Right handed    Social Determinants of Health   Financial Resource Strain: Low Risk  (03/16/2023)   Overall Financial Resource Strain (CARDIA)    Difficulty of Paying Living Expenses: Not hard at all  Food Insecurity: No Food Insecurity (09/06/2023)   Hunger Vital Sign    Worried About Running Out of Food in the Last Year: Never true    Ran Out of Food in the Last Year: Never true  Transportation Needs: No Transportation Needs (09/06/2023)   PRAPARE - Administrator, Civil Service (Medical): No    Lack of Transportation (Non-Medical): No  Physical Activity: Sufficiently Active (03/16/2023)   Exercise Vital Sign    Days of Exercise per Week: 3 days    Minutes of Exercise per Session: 150+ min  Stress: No Stress Concern Present (03/16/2023)   [employer] of Occupational Health - Occupational Stress Questionnaire    Feeling of Stress : Not at all  Social Connections: Unknown (03/16/2023)   Social Connection and Isolation Panel [NHANES]    Frequency of Communication with Friends and Family: More than three times a week    Frequency of Social Gatherings with Friends and Family: Twice a week    Attends Religious Services: Not on Marketing executive or Organizations: No    Attends Banker Meetings: Not on file    Marital Status: Never married  Intimate Partner Violence: Not At Risk (08/30/2023)   Humiliation, Afraid, Rape, and Kick questionnaire    Fear of Current  or Ex-Partner: No    Emotionally Abused: No    Physically Abused: No    Sexually Abused: No    Outpatient Medications Prior to Visit  Medication Sig Dispense Refill   escitalopram (LEXAPRO) 20 MG tablet Take 1 tablet (20 mg total) by mouth daily. 90 tablet 3   folic acid (FOLVITE) 1 MG tablet Take 1 tablet (1 mg total) by mouth daily. 30 tablet 2   hydrALAZINE (APRESOLINE) 50 MG tablet Take 1 tablet (50 mg total) by mouth 3 (three) times daily as needed (If systolic BP greater than 150 mmHg.). If systolic BP greater than 150 mmHg. 90 tablet 1   hydrOXYzine (ATARAX) 10 MG tablet Take 1 tablet (10 mg total) by mouth 3 (three) times daily as needed for anxiety. 30 tablet 0   losartan (COZAAR) 50 MG tablet Take 1 tablet (50 mg total) by mouth daily. 90 tablet 3   Multiple Vitamins-Minerals (MULTIVITAMIN WITH MINERALS) tablet Take 1 tablet by mouth daily. 90 tablet 3   thiamine (VITAMIN B-1) 100 MG tablet Take 1 tablet (100 mg total) by mouth daily. 30 tablet 0   vitamin B-12 (CYANOCOBALAMIN) 100 MCG tablet Take 100 mcg by mouth daily.     [START ON 09/08/2023] Vitamin D, Ergocalciferol, (DRISDOL) 1.25 MG (50000 UNIT) CAPS capsule Take 1 capsule (50,000 Units total) by mouth every 7 (seven) days. 12 capsule 0   No facility-administered medications prior to visit.    No Known Allergies  ROS Review of Systems  Constitutional:  Positive for fatigue. Negative for chills.      Objective:    Physical Exam Constitutional:      Appearance: Normal appearance.  HENT:     Head: Normocephalic and atraumatic.  Eyes:     Conjunctiva/sclera: Conjunctivae normal.  Cardiovascular:     Rate and Rhythm: Normal rate and regular rhythm.  Pulmonary:     Effort: Pulmonary effort is normal.     Breath sounds: Normal breath sounds.  Abdominal:     General: There is no distension.     Palpations: Abdomen is soft.     Tenderness: There is no abdominal tenderness.  Skin:    General: Skin is warm and  dry.     Comments: See photo below  Neurological:     General: No focal deficit present.     Mental Status: He is alert. Mental status is at baseline.  Psychiatric:        Mood and Affect: Mood is anxious and depressed.     BP (!) 146/92   Pulse (!) 111   Temp 97.9 F (36.6 C)   Resp 18   Ht 5\' 7"  (1.702 m)   Wt 186 lb 12.8 oz (84.7 kg)   SpO2 99%   BMI 29.26 kg/m  Wt Readings from Last 3 Encounters:  09/06/23 186 lb 12.8 oz (84.7 kg)  08/30/23 194 lb  0.1 oz (88 kg)  07/04/23 197 lb 11.2 oz (89.7 kg)     Health Maintenance Due  Topic Date Due   COVID-19 Vaccine (2 - 2023-24 season) 07/24/2023        There are no preventive care reminders to display for this patient.  Lab Results  Component Value Date   TSH 1.562 04/18/2023   Lab Results  Component Value Date   WBC 4.0 09/01/2023   HGB 14.0 09/01/2023   HCT 38.2 (L) 09/01/2023   MCV 94.1 09/01/2023   PLT 90 (L) 09/01/2023   Lab Results  Component Value Date   NA 136 09/01/2023   K 3.5 09/01/2023   CO2 23 09/01/2023   GLUCOSE 120 (H) 09/01/2023   BUN 13 09/01/2023   CREATININE 0.77 09/01/2023   BILITOT 1.1 09/01/2023   ALKPHOS 119 09/01/2023   AST 100 (H) 09/01/2023   ALT 72 (H) 09/01/2023   PROT 7.2 09/01/2023   ALBUMIN 3.7 09/01/2023   CALCIUM 9.1 09/01/2023   ANIONGAP 10 09/01/2023   EGFR 115 07/04/2023   Lab Results  Component Value Date   CHOL 258 (H) 01/11/2022   Lab Results  Component Value Date   HDL 61 01/11/2022   Lab Results  Component Value Date   LDLCALC 166 (H) 01/11/2022   Lab Results  Component Value Date   TRIG 162 (H) 01/11/2022   Lab Results  Component Value Date   CHOLHDL 4.2 01/11/2022   Lab Results  Component Value Date   HGBA1C 5.7 (H) 02/08/2022      Assessment & Plan:   1. Hospital discharge follow-up/Alcohol use disorder, severe, dependence (HCC)/Thrombocytopenia [facility]): Labs continuing to worsen, ordered today but patient will return in 2 weeks to  recheck labs. [place] ordered as well to assess liver. Now following with Psychiatry, started on Naltrexone, will follow along.  - CBC w/Diff/Platelet - COMPLETE METABOLIC PANEL WITH GFR; Future - INR/PT; Future - US Abdomen Limited RUQ (LIVER/GB); Future  2. Rash: Uncertain etiology, referral to Dermatology placed.   - hydrocortisone 1 % ointment; Apply 1 Application topically 2 (two) times daily.  Dispense: 30 g; Refill: 0 - Ambulatory referral to Dermatology  3. Need for influenza vaccination: Flu vaccine administered today.   - Flu vaccine trivalent PF, 6mos and older(Flulaval,Afluria,Fluarix,Fluzone)   Follow-up: Return in about 3 months (around 12/07/2023).    Margarita Mail, DO

## 2023-09-06 NOTE — Transitions of Care (Post Inpatient/ED Visit) (Signed)
09/06/2023  Name: Melvin Knight MRN: [id number] DOB: 1981/02/22  Today's TOC FU Call Status: Today's TOC FU Call Status:: Successful TOC FU Call Completed TOC FU Call Complete Date: 09/06/23 Patient's Name and Date of Birth confirmed.  Transition Care Management Follow-up Telephone Call Date of Discharge: 09/01/23 Discharge Facility: [facility]) Type of Discharge: Inpatient Admission Primary Inpatient Discharge Diagnosis:: Alcohol withdrawl How have you been since you were released from the hospital?: Better Any questions or concerns?: Yes Patient Questions/Concerns:: Patient has concerrns about his potassium level and loss of drivers license. He has a Dr visit today. He will dosciss K+ supplements with Dr. He has to get the PCP to sign off to get license back. He will discuss with her. Patient Questions/Concerns Addressed: Other: (Patient will discuss concerns with PCP at visit today)  Items Reviewed: Did you receive and understand the discharge instructions provided?: Yes Medications obtained,verified, and reconciled?: Yes (Medications Reviewed) Any new allergies since your discharge?: No Dietary orders reviewed?: Yes Type of Diet Ordered:: low sodium heart healthy Do you have support at home?: Yes People in Home: alone Name of Support/Comfort Primary Source: louis uncle  Medications Reviewed Today: Medications Reviewed Today     Reviewed by Luella Cook, RN (Case Manager) on 09/06/23 at [phone]  Med List Status: <None>   Medication Order Taking? Sig Documenting Provider Last Dose Status Informant  escitalopram (LEXAPRO) 20 MG tablet [id number] Yes Take 1 tablet (20 mg total) by mouth daily. Gillis Santa, MD Taking Active   folic acid (FOLVITE) 1 MG tablet [id number] Yes Take 1 tablet (1 mg total) by mouth daily. Pennie Banter, DO Taking Active Self  hydrALAZINE (APRESOLINE) 50 MG tablet [id number] Yes Take 1 tablet (50 mg total) by mouth 3  (three) times daily as needed (If systolic BP greater than 150 mmHg.). If systolic BP greater than 150 mmHg. Gillis Santa, MD Taking Active   hydrOXYzine (ATARAX) 10 MG tablet [id number] Yes Take 1 tablet (10 mg total) by mouth 3 (three) times daily as needed for anxiety. Margarita Mail, DO Taking Active Self  losartan (COZAAR) 50 MG tablet [id number] Yes Take 1 tablet (50 mg total) by mouth daily. Gillis Santa, MD Taking Active   Multiple Vitamins-Minerals (MULTIVITAMIN WITH MINERALS) tablet [id number] Yes Take 1 tablet by mouth daily. Margarita Mail, DO Taking Active Self  thiamine (VITAMIN B-1) 100 MG tablet [id number] Yes Take 1 tablet (100 mg total) by mouth daily. Gillis Santa, MD Taking Active   vitamin B-12 (CYANOCOBALAMIN) 100 MCG tablet [id number] Yes Take 100 mcg by mouth daily. [provider] Taking Active Self  Vitamin D, Ergocalciferol, (DRISDOL) 1.25 MG (50000 UNIT) CAPS capsule [id number] Yes Take 1 capsule (50,000 Units total) by mouth every 7 (seven) days. Gillis Santa, MD Taking Active             Home Care and Equipment/Supplies: Were Home Health Services Ordered?: NA Any new equipment or medical supplies ordered?: NA  Functional Questionnaire: Do you need assistance with bathing/showering or dressing?: No Do you need assistance with meal preparation?: No Do you need assistance with eating?: No Do you have difficulty maintaining continence: No Do you need assistance with getting out of bed/getting out of a chair/moving?: No Do you have difficulty managing or taking your medications?: No  Follow up appointments reviewed: PCP Follow-up appointment confirmed?: Yes Date of PCP follow-up appointment?: 09/06/23 Follow-up Provider: Dr Caralee Ates [facility] Follow-up appointment confirmed?: No Reason Specialist Follow-Up Not Confirmed: Patient  has Specialist Provider Number and will Call for Appointment Do you need transportation to your follow-up  appointment?: No Do you understand care options if your condition(s) worsen?: Yes-patient verbalized understanding  SDOH Interventions Today    Flowsheet Row Most Recent Value  SDOH Interventions   Food Insecurity Interventions Intervention Not Indicated  Housing Interventions Intervention Not Indicated  Transportation Interventions Intervention Not Indicated, Patient Resources (Friends/Family)  Utilities Interventions Intervention Not Indicated      Interventions Today    Flowsheet Row Most Recent Value  General Interventions   General Interventions Discussed/Reviewed General Interventions Discussed, General Interventions Reviewed, Community Resources, Doctor Visits  [RN discussed his mental health, ETOH abuse, and nutrition]  Doctor Visits Discussed/Reviewed Doctor Visits Discussed, Doctor Visits Reviewed  Education Interventions   Education Provided Provided Education  Provided Verbal Education On Nutrition, Mental Health/Coping with Illness  Mental Health Interventions   Mental Health Discussed/Reviewed Mental Health Discussed, Mental Health Reviewed, Depression  [Patient will contact his psych Dr to discuss depression]  Nutrition Interventions   Nutrition Discussed/Reviewed Nutrition Discussed  [RN discussed foods high in potassium]  Pharmacy Interventions   Pharmacy Dicussed/Reviewed Pharmacy Topics Discussed, Pharmacy Topics Reviewed  [RN discussed taking medications as prescribed]      TOC Interventions Today    Flowsheet Row Most Recent Value  TOC Interventions   TOC Interventions Discussed/Reviewed TOC Interventions Discussed, TOC Interventions Reviewed       F/U appt [phone] 9:45 with Scarlette Calico Keyoni Lapinski RN  Gean Maidens BSN RN [facility] [phone]

## 2023-09-08 ENCOUNTER — Ambulatory Visit
Admission: RE | Admit: 2023-09-08 | Discharge: 2023-09-08 | Disposition: A | Discharge status: Home / Self Care | Payer: Self-pay | Source: Ambulatory Visit | Attending: Internal Medicine | Admitting: Internal Medicine

## 2023-09-08 DIAGNOSIS — Z09 Encounter for follow-up examination after completed treatment for conditions other than malignant neoplasm: Secondary | ICD-10-CM | POA: Insufficient documentation

## 2023-09-08 DIAGNOSIS — F1020 Alcohol dependence, uncomplicated: Secondary | ICD-10-CM | POA: Insufficient documentation

## 2023-09-09 ENCOUNTER — Telehealth: Payer: Self-pay

## 2023-09-13 ENCOUNTER — Ambulatory Visit: Payer: Self-pay | Admitting: *Deleted

## 2023-09-13 NOTE — Patient Outreach (Signed)
  Care Management  Transitions of Care Program Transitions of Care Post-discharge week 2  09/13/2023 Name: Burwell Hoage MRN: [id number] DOB: 04/11/1981  Subjective: Finch Pfuhl is a 42 y.o. year old male who is a primary care patient of Margarita Mail, DO. The Care Management team was unable to reach the patient by phone to assess and address transitions of care needs.   Plan: Additional outreach attempts will be made to reach the patient enrolled in the [facility] Program (Post Inpatient/ED Visit).  Gean Maidens BSN RN [facility] [phone]

## 2023-09-20 ENCOUNTER — Telehealth: Payer: Self-pay

## 2023-09-24 ENCOUNTER — Inpatient Hospital Stay
Admission: EM | Admit: 2023-09-24 | Discharge: 2023-09-25 | DRG: 897 | Disposition: A | Discharge status: Home / Self Care | Payer: BLUE CROSS/BLUE SHIELD | Attending: Internal Medicine | Admitting: Internal Medicine

## 2023-09-24 ENCOUNTER — Observation Stay: Payer: BLUE CROSS/BLUE SHIELD

## 2023-09-24 ENCOUNTER — Other Ambulatory Visit: Payer: Self-pay

## 2023-09-24 DIAGNOSIS — Z8249 Family history of ischemic heart disease and other diseases of the circulatory system: Secondary | ICD-10-CM | POA: Diagnosis not present

## 2023-09-24 DIAGNOSIS — F10939 Alcohol use, unspecified with withdrawal, unspecified: Secondary | ICD-10-CM | POA: Diagnosis not present

## 2023-09-24 DIAGNOSIS — I10 Essential (primary) hypertension: Secondary | ICD-10-CM | POA: Diagnosis present

## 2023-09-24 DIAGNOSIS — E876 Hypokalemia: Secondary | ICD-10-CM | POA: Diagnosis present

## 2023-09-24 DIAGNOSIS — F17210 Nicotine dependence, cigarettes, uncomplicated: Secondary | ICD-10-CM | POA: Diagnosis present

## 2023-09-24 DIAGNOSIS — F10239 Alcohol dependence with withdrawal, unspecified: Secondary | ICD-10-CM | POA: Diagnosis present

## 2023-09-24 DIAGNOSIS — Z720 Tobacco use: Secondary | ICD-10-CM | POA: Diagnosis not present

## 2023-09-24 DIAGNOSIS — R112 Nausea with vomiting, unspecified: Secondary | ICD-10-CM | POA: Diagnosis present

## 2023-09-24 DIAGNOSIS — Z79899 Other long term (current) drug therapy: Secondary | ICD-10-CM

## 2023-09-24 DIAGNOSIS — F32A Depression, unspecified: Secondary | ICD-10-CM | POA: Diagnosis present

## 2023-09-24 DIAGNOSIS — Y900 Blood alcohol level of less than 20 mg/100 ml: Secondary | ICD-10-CM | POA: Diagnosis present

## 2023-09-24 DIAGNOSIS — D696 Thrombocytopenia, unspecified: Secondary | ICD-10-CM | POA: Diagnosis present

## 2023-09-24 DIAGNOSIS — D61818 Other pancytopenia: Secondary | ICD-10-CM | POA: Diagnosis present

## 2023-09-24 DIAGNOSIS — F419 Anxiety disorder, unspecified: Secondary | ICD-10-CM | POA: Diagnosis present

## 2023-09-24 DIAGNOSIS — Z823 Family history of stroke: Secondary | ICD-10-CM | POA: Diagnosis not present

## 2023-09-24 DIAGNOSIS — R6510 Systemic inflammatory response syndrome (SIRS) of non-infectious origin without acute organ dysfunction: Secondary | ICD-10-CM | POA: Diagnosis present

## 2023-09-24 DIAGNOSIS — E669 Obesity, unspecified: Secondary | ICD-10-CM | POA: Diagnosis present

## 2023-09-24 DIAGNOSIS — Z6830 Body mass index (BMI) 30.0-30.9, adult: Secondary | ICD-10-CM

## 2023-09-24 LAB — CBC
HCT: 33.9 % — ABNORMAL LOW (ref 39.0–52.0)
Hemoglobin: 12.5 g/dL — ABNORMAL LOW (ref 13.0–17.0)
MCH: 35 pg — ABNORMAL HIGH (ref 26.0–34.0)
MCHC: 36.9 g/dL — ABNORMAL HIGH (ref 30.0–36.0)
MCV: 95 fL (ref 80.0–100.0)
Platelets: 119 10*3/uL — ABNORMAL LOW (ref 150–400)
RBC: 3.57 MIL/uL — ABNORMAL LOW (ref 4.22–5.81)
RDW: 12 % (ref 11.5–15.5)
WBC: 3.7 10*3/uL — ABNORMAL LOW (ref 4.0–10.5)
nRBC: 0 % (ref 0.0–0.2)

## 2023-09-24 LAB — MAGNESIUM: Magnesium: 1.1 mg/dL — ABNORMAL LOW (ref 1.7–2.4)

## 2023-09-24 LAB — COMPREHENSIVE METABOLIC PANEL
ALT: 41 U/L (ref 0–44)
AST: 61 U/L — ABNORMAL HIGH (ref 15–41)
Albumin: 3.8 g/dL (ref 3.5–5.0)
Alkaline Phosphatase: 87 U/L (ref 38–126)
Anion gap: 9 (ref 5–15)
BUN: 10 mg/dL (ref 6–20)
CO2: 26 mmol/L (ref 22–32)
Calcium: 8.3 mg/dL — ABNORMAL LOW (ref 8.9–10.3)
Chloride: 105 mmol/L (ref 98–111)
Creatinine, Ser: 0.69 mg/dL (ref 0.61–1.24)
GFR, Estimated: >60 mL/min (ref >60)
Glucose, Bld: 113 mg/dL — ABNORMAL HIGH (ref 70–99)
Potassium: 3 mmol/L — ABNORMAL LOW (ref 3.5–5.1)
Sodium: 140 mmol/L (ref 135–145)
Total Bilirubin: 1.6 mg/dL — ABNORMAL HIGH (ref 0.3–1.2)
Total Protein: 7.4 g/dL (ref 6.5–8.1)

## 2023-09-24 LAB — PROCALCITONIN: Procalcitonin: <0.1 ng/mL

## 2023-09-24 LAB — PHOSPHORUS: Phosphorus: 2.2 mg/dL — ABNORMAL LOW (ref 2.5–4.6)

## 2023-09-24 LAB — ETHANOL: Alcohol, Ethyl (B): <10 mg/dL (ref <10)

## 2023-09-24 MED ORDER — MAGNESIUM SULFATE 4 GM/100ML IV SOLN
4.0000 g | Freq: Once | INTRAVENOUS | Status: AC
  Administered 2023-09-24: 4 g via INTRAVENOUS
  Filled 2023-09-24: qty 100

## 2023-09-24 MED ORDER — DIAZEPAM 5 MG/ML IJ SOLN
20.0000 mg | Freq: Once | INTRAMUSCULAR | Status: AC
  Administered 2023-09-24: 20 mg via INTRAVENOUS
  Filled 2023-09-24: qty 4

## 2023-09-24 MED ORDER — ONDANSETRON HCL 4 MG/2ML IJ SOLN: 4.0000 mg | Freq: Three times a day (TID) | INTRAMUSCULAR | Status: DC | PRN

## 2023-09-24 MED ORDER — LORAZEPAM 2 MG/ML IJ SOLN
2.0000 mg | INTRAMUSCULAR | Status: DC | PRN
  Administered 2023-09-25: 2 mg via INTRAVENOUS

## 2023-09-24 MED ORDER — LORAZEPAM 2 MG PO TABS
0.0000 mg | ORAL_TABLET | Freq: Four times a day (QID) | ORAL | Status: DC
  Administered 2023-09-24: 1 mg via ORAL
  Administered 2023-09-24: 3 mg via ORAL
  Administered 2023-09-25: 1 mg via ORAL
  Filled 2023-09-24: qty 1
  Filled 2023-09-24: qty 2

## 2023-09-24 MED ORDER — POTASSIUM CHLORIDE CRYS ER 20 MEQ PO TBCR
60.0000 meq | EXTENDED_RELEASE_TABLET | Freq: Once | ORAL | Status: AC
  Administered 2023-09-24: 60 meq via ORAL
  Filled 2023-09-24: qty 3

## 2023-09-24 MED ORDER — DIAZEPAM 5 MG/ML IJ SOLN
10.0000 mg | Freq: Once | INTRAMUSCULAR | Status: AC
  Administered 2023-09-24: 10 mg via INTRAVENOUS
  Filled 2023-09-24: qty 2

## 2023-09-24 MED ORDER — LORAZEPAM 2 MG PO TABS: 0.0000 mg | ORAL_TABLET | Freq: Two times a day (BID) | ORAL | Status: DC

## 2023-09-24 MED ORDER — IBUPROFEN 400 MG PO TABS: 400.0000 mg | ORAL_TABLET | Freq: Four times a day (QID) | ORAL | Status: DC | PRN

## 2023-09-24 MED ORDER — THIAMINE MONONITRATE 100 MG PO TABS
100.0000 mg | ORAL_TABLET | Freq: Every day | ORAL | Status: DC
  Administered 2023-09-24 – 2023-09-25 (×2): 100 mg via ORAL
  Filled 2023-09-24 (×2): qty 1

## 2023-09-24 MED ORDER — HYDROXYZINE HCL 10 MG PO TABS: 10.0000 mg | ORAL_TABLET | Freq: Three times a day (TID) | ORAL | Status: DC | PRN

## 2023-09-24 MED ORDER — ENOXAPARIN SODIUM 40 MG/0.4ML IJ SOSY: 40.0000 mg | PREFILLED_SYRINGE | INTRAMUSCULAR | Status: DC

## 2023-09-24 MED ORDER — THIAMINE HCL 100 MG/ML IJ SOLN: 100.0000 mg | Freq: Every day | INTRAMUSCULAR | Status: DC

## 2023-09-24 MED ORDER — HYDRALAZINE HCL 20 MG/ML IJ SOLN: 5.0000 mg | INTRAMUSCULAR | Status: DC | PRN

## 2023-09-24 MED ORDER — PANTOPRAZOLE SODIUM 40 MG PO TBEC
40.0000 mg | DELAYED_RELEASE_TABLET | Freq: Every day | ORAL | Status: DC
  Administered 2023-09-24 – 2023-09-25 (×2): 40 mg via ORAL
  Filled 2023-09-24 (×2): qty 1

## 2023-09-24 MED ORDER — CHLORDIAZEPOXIDE HCL 25 MG PO CAPS
25.0000 mg | ORAL_CAPSULE | Freq: Three times a day (TID) | ORAL | Status: DC
  Administered 2023-09-25: 25 mg via ORAL
  Filled 2023-09-24: qty 1

## 2023-09-24 MED ORDER — NICOTINE 21 MG/24HR TD PT24
21.0000 mg | MEDICATED_PATCH | Freq: Every day | TRANSDERMAL | Status: DC
Start: 2023-09-24 — End: 2023-09-25
  Filled 2023-09-24: qty 1

## 2023-09-24 MED ORDER — LOSARTAN POTASSIUM 50 MG PO TABS
50.0000 mg | ORAL_TABLET | Freq: Every day | ORAL | Status: DC
  Administered 2023-09-24 – 2023-09-25 (×2): 50 mg via ORAL
  Filled 2023-09-24 (×2): qty 1

## 2023-09-24 MED ORDER — SODIUM CHLORIDE 0.9 % IV BOLUS
1000.0000 mL | Freq: Once | INTRAVENOUS | Status: AC
  Administered 2023-09-24: 1000 mL via INTRAVENOUS

## 2023-09-24 MED ORDER — POTASSIUM PHOSPHATES 15 MMOLE/5ML IV SOLN
15.0000 mmol | Freq: Once | INTRAVENOUS | Status: AC
  Administered 2023-09-25: 15 mmol via INTRAVENOUS
  Filled 2023-09-24: qty 5

## 2023-09-24 MED ORDER — LORAZEPAM 2 MG/ML IJ SOLN: 0.0000 mg | Freq: Two times a day (BID) | INTRAMUSCULAR | Status: DC

## 2023-09-24 MED ORDER — ESCITALOPRAM OXALATE 10 MG PO TABS
20.0000 mg | ORAL_TABLET | Freq: Every day | ORAL | Status: DC
  Administered 2023-09-25: 20 mg via ORAL
  Filled 2023-09-24: qty 2

## 2023-09-24 MED ORDER — CHLORDIAZEPOXIDE HCL 25 MG PO CAPS
50.0000 mg | ORAL_CAPSULE | Freq: Once | ORAL | Status: AC
  Administered 2023-09-24: 50 mg via ORAL
  Filled 2023-09-24: qty 2

## 2023-09-24 MED ORDER — LORAZEPAM 2 MG/ML IJ SOLN
0.0000 mg | Freq: Four times a day (QID) | INTRAMUSCULAR | Status: DC
  Filled 2023-09-24 (×2): qty 1

## 2023-09-24 NOTE — ED Provider Notes (Signed)
[facility] Provider Note    Event Date/Time   First MD Initiated Contact with Patient 09/24/23 1611     (approximate)   History   Chief Complaint: detox   HPI  Melvin Knight is a 42 y.o. male with a history of hypertension anxiety alcohol dependence who comes to the ED due to sweating and shakiness.  Reports that he has a history of alcohol withdrawal seizures.  He has been through detox twice in the past.  He had relapsed into drinking, but had decided to stop again.  Last drink was yesterday 10:00 PM, and then started developing tremors this morning, which worsened throughout the day.  He reports being unable to stand up and walk due to being off balance.  No trauma or loss of consciousness.  Denies pain currently.     Physical Exam   Triage Vital Signs: ED Triage Vitals  Encounter Vitals Group     BP --      Systolic BP Percentile --      Diastolic BP Percentile --      Pulse --      Resp --      Temp 09/24/23 1553 98.4 F (36.9 C)     Temp Source 09/24/23 1553 Oral     SpO2 --      Weight --      Height --      Head Circumference --      Peak Flow --      Pain Score 09/24/23 1549 0     Pain Loc --      Pain Education --      Exclude from Growth Chart --     Most recent vital signs: Vitals:   09/24/23 1553 09/24/23 1749  BP:  (!) 168/111  Pulse:  84  Temp: 98.4 F (36.9 C)     General: Awake, no distress. Diaphoretic, tremulous CV:  Good peripheral perfusion. Tachycardia, HR 110 Resp:  Normal effort. ctab Abd:  No distention. Soft nt Other:  Dry MM   ED Results / Procedures / Treatments   Labs (all labs ordered are listed, but only abnormal results are displayed) Labs Reviewed  ETHANOL  URINE DRUG SCREEN, QUALITATIVE ([facility] ONLY)  CBC  COMPREHENSIVE METABOLIC PANEL     EKG    RADIOLOGY    PROCEDURES:  .Critical Care  Performed by: Sharman Cheek, MD Authorized by: Sharman Cheek, MD   Critical care  provider statement:    Critical care time (minutes):  35   Critical care time was exclusive of:  Separately billable procedures and treating other patients   Critical care was necessary to treat or prevent imminent or life-threatening deterioration of the following conditions:  Sepsis, respiratory failure, CNS failure or compromise and dehydration   Critical care was time spent personally by me on the following activities:  Development of treatment plan with patient or surrogate, discussions with consultants, evaluation of patient's response to treatment, examination of patient, obtaining history from patient or surrogate, ordering and performing treatments and interventions, ordering and review of laboratory studies, ordering and review of radiographic studies, pulse oximetry, re-evaluation of patient's condition and review of old charts   Care discussed with: admitting provider      MEDICATIONS ORDERED IN ED: Medications  LORazepam (ATIVAN) injection 0-4 mg ( Intravenous See Alternative 09/24/23 1810)    Or  LORazepam (ATIVAN) tablet 0-4 mg (3 mg Oral Given 09/24/23 1810)  LORazepam (ATIVAN) injection 0-4 mg (  has no administration in time range)    Or  LORazepam (ATIVAN) tablet 0-4 mg (has no administration in time range)  thiamine (VITAMIN B1) tablet 100 mg (100 mg Oral Given 09/24/23 1811)    Or  thiamine (VITAMIN B1) injection 100 mg ( Intravenous See Alternative 09/24/23 1811)  diazepam (VALIUM) injection 10 mg (10 mg Intravenous Given 09/24/23 1645)  sodium chloride 0.9 % bolus 1,000 mL (0 mLs Intravenous Stopped 09/24/23 1936)  diazepam (VALIUM) injection 20 mg (20 mg Intravenous Given 09/24/23 1904)     IMPRESSION / MDM / ASSESSMENT AND PLAN / ED COURSE  I reviewed the triage vital signs and the nursing notes.  DDx: Electrolyte abnormality, metabolic acidosis, dehydration, anemia, drug abuse, alcohol withdrawal syndrome  Patient's presentation is most consistent with acute  presentation with potential threat to life or bodily function.  Patient presents with tremor, diaphoresis, flushing, tachycardia concerning for moderate alcohol withdrawal syndrome.  Has a history of seizures.  Will give IV Valium, will need to admit for medical management.    ----------------------------------------- 6:52 PM on 09/24/2023 ----------------------------------------- Still tremulous, diaphoretic despite 10 mg IV Valium.  Will give additional 20 mg IV Valium dose.  Will need to admit due to ongoing alcohol withdrawal syndrome and history of severe withdrawal with seizures.  Mental status is currently okay, no hallucinosis or seizures.   ----------------------------------------- 9:16 PM on 09/24/2023 ----------------------------------------- Patient more comfortable, last CIWA score 5.  Discussed with hospitalist for further management.     FINAL CLINICAL IMPRESSION(S) / ED DIAGNOSES   Final diagnoses:  Alcohol dependence with withdrawal with complication (HCC)     Rx / DC Orders   ED Discharge Orders     None        Note:  This document was prepared using Dragon voice recognition software and may include unintentional dictation errors.   Sharman Cheek, MD 09/24/23 2116

## 2023-09-24 NOTE — ED Triage Notes (Addendum)
Pt comes via EMs from home with c/o detox. Pt has been shaking since 0600. Pt last drink was 10pm last night. Pt has obvious tremors. VSS per EMS  Pt vomiting in lobby.

## 2023-09-24 NOTE — H&P (Signed)
History and Physical    Melvin Knight ZOX:[id number] DOB: November 05, 1981 DOA: 09/24/2023  Referring MD/NP/PA:   PCP: Margarita Mail, DO   Patient coming from:  The patient is coming from home.     Chief Complaint: alcohol withdraw  HPI: Melvin Knight is a 42 y.o. male with medical history significant of tobacco abuse, alcohol abuse, hypertension, obesity, depression with anxiety, thrombocytopenia, seizure, hypokalemic periodic paralysis, who presents with alcohol withdrawal.   Pt has history of alcohol withdrawal seizures.  He has been through detox twice in the past.  He had relapsed into drinking, but had decided to stop again.  Last drink was yesterday 10:00 PM. He becomes tremulous since this morning.  No seizure.  No fall. He reports being unable to stand up and walk due to being off balance.  Patient has nausea, nonbilious nonbloody vomiting and several episodes of diarrhea.  No abdominal pain. No chest pain, cough, SOB.  Patient denies subjective fever and chills, but he is found to have fever with temperature 100.1 in ED.  No rectal bleeding or dark stool.  Data reviewed independently and ED Course: pt was found to have pancytopenia with WBCs 3.7, hemoglobin 12.5, platelet 119 (hemoglobin 14.0 on 09/01/2023), GFR> 60, potassium 3.0, blood pressure 156/116, heart rate 84, RR 24, oxygen saturation 96% on room air.  Patient is admitted to telemetry bed as inpatient.   EKG:  Not done in ED, will get one.   Review of Systems:   General: has fevers, no chills, no body weight gain, has poor appetite, has fatigue HEENT: no blurry vision, hearing changes or sore throat Respiratory: no dyspnea, coughing, wheezing CV: no chest pain, no palpitations GI: has nausea, vomiting, diarrhea, no constipation, abdominal pain,  GU: no dysuria, burning on urination, increased urinary frequency, hematuria  Ext: no leg edema Neuro: no unilateral weakness, numbness, or tingling, no vision change or  hearing loss. Has tremor Skin: no rash, no skin tear. MSK: No muscle spasm, no deformity, no limitation of range of movement in spin Heme: No easy bruising.  Travel history: No recent long distant travel.   Allergy: No Known Allergies  Past Medical History:  Diagnosis Date   AKI (acute kidney injury) (HCC) 09/24/2022   Anxiety    Hypertension    Hypokalemia    Hypokalemia 09/24/2022   Hypomagnesemia 09/24/2022    History reviewed. No pertinent surgical history.  Social History:  reports that he has been smoking cigarettes. He has never used smokeless tobacco. He reports current alcohol use of about 6.0 standard drinks of alcohol per week. He reports that he does not currently use drugs.  Family History:  Family History  Problem Relation Age of Onset   Hypertension Father    Stroke Father    Cancer Sister      Prior to Admission medications   Medication Sig Start Date End Date Taking? Authorizing Provider  escitalopram (LEXAPRO) 20 MG tablet Take 1 tablet (20 mg total) by mouth daily. 09/01/23 08/31/24  Gillis Santa, MD  folic acid (FOLVITE) 1 MG tablet Take 1 tablet (1 mg total) by mouth daily. 09/26/22   Esaw Grandchild A, DO  hydrALAZINE (APRESOLINE) 50 MG tablet Take 1 tablet (50 mg total) by mouth 3 (three) times daily as needed (If systolic BP greater than 150 mmHg.). If systolic BP greater than 150 mmHg. 09/01/23 10/01/23  Gillis Santa, MD  hydrocortisone 1 % ointment Apply 1 Application topically 2 (two) times daily. 09/06/23   Margarita Mail,  DO  hydrOXYzine (ATARAX) 10 MG tablet Take 1 tablet (10 mg total) by mouth 3 (three) times daily as needed for anxiety. 03/17/23   Margarita Mail, DO  losartan (COZAAR) 50 MG tablet Take 1 tablet (50 mg total) by mouth daily. 09/02/23 09/01/24  Gillis Santa, MD  Multiple Vitamins-Minerals (MULTIVITAMIN WITH MINERALS) tablet Take 1 tablet by mouth daily. 07/06/22   Margarita Mail, DO  thiamine (VITAMIN B-1) 100 MG tablet  Take 1 tablet (100 mg total) by mouth daily. 09/01/23 10/01/23  Gillis Santa, MD  vitamin B-12 (CYANOCOBALAMIN) 100 MCG tablet Take 100 mcg by mouth daily.    [provider]  Vitamin D, Ergocalciferol, (DRISDOL) 1.25 MG (50000 UNIT) CAPS capsule Take 1 capsule (50,000 Units total) by mouth every 7 (seven) days. 09/08/23 12/07/23  Gillis Santa, MD    Physical Exam: Vitals:   09/24/23 1959 09/24/23 2000 09/24/23 2100 09/24/23 2208  BP: (!) 156/116 (!) 156/116  (!) 159/96  Pulse: 83 83  80  Resp: (!) 24   (!) 22  Temp: 100.1 F (37.8 C)     TempSrc: Oral     SpO2: 96%   96%  Weight:   87.4 kg   Height:   5\' 7"  (1.702 m)    General: Not in acute distress.  Patient is tremulous HEENT:       Eyes: PERRL, EOMI, no jaundice       ENT: No discharge from the ears and nose, no pharynx injection, no tonsillar enlargement.        Neck: No JVD, no bruit, no mass felt. Heme: No neck lymph node enlargement. Cardiac: S1/S2, RRR, No murmurs, No gallops or rubs. Respiratory: No rales, wheezing, rhonchi or rubs. GI: Soft, nondistended, nontender, no rebound pain, no organomegaly, BS present. GU: No hematuria Ext: No pitting leg edema bilaterally. 1+DP/PT pulse bilaterally. Musculoskeletal: No joint deformities, No joint redness or warmth, no limitation of ROM in spin. Skin: No rashes.  Neuro: Alert, oriented X3, cranial nerves II-XII grossly intact, moves all extremities normally. Psych: Patient is not psychotic, no suicidal or hemocidal ideation.  Labs on Admission: I have personally reviewed following labs and imaging studies  CBC: Recent Labs  Lab 09/24/23 2021  WBC 3.7*  HGB 12.5*  HCT 33.9*  MCV 95.0  PLT 119*   Basic Metabolic Panel: Recent Labs  Lab 09/24/23 2021  NA 140  K 3.0*  CL 105  CO2 26  GLUCOSE 113*  BUN 10  CREATININE 0.69  CALCIUM 8.3*  MG 1.1*  PHOS 2.2*   GFR: Estimated Creatinine Clearance: 130.8 mL/min (by C-G formula based on SCr of 0.69  mg/dL). Liver Function Tests: Recent Labs  Lab 09/24/23 2021  AST 61*  ALT 41  ALKPHOS 87  BILITOT 1.6*  PROT 7.4  ALBUMIN 3.8   No results for input(s): "LIPASE", "AMYLASE" in the last 168 hours. No results for input(s): "AMMONIA" in the last 168 hours. Coagulation Profile: No results for input(s): "INR", "PROTIME" in the last 168 hours. Cardiac Enzymes: No results for input(s): "CKTOTAL", "CKMB", "CKMBINDEX", "TROPONINI" in the last 168 hours. BNP (last 3 results) No results for input(s): "PROBNP" in the last 8760 hours. HbA1C: No results for input(s): "HGBA1C" in the last 72 hours. CBG: No results for input(s): "GLUCAP" in the last 168 hours. Lipid Profile: No results for input(s): "CHOL", "HDL", "LDLCALC", "TRIG", "CHOLHDL", "LDLDIRECT" in the last 72 hours. Thyroid Function Tests: No results for input(s): "TSH", "T4TOTAL", "FREET4", "T3FREE", "THYROIDAB" in  the last 72 hours. Anemia Panel: No results for input(s): "VITAMINB12", "FOLATE", "FERRITIN", "TIBC", "IRON", "RETICCTPCT" in the last 72 hours. Urine analysis:    Component Value Date/Time   COLORURINE AMBER (A) 09/24/2022 0051   APPEARANCEUR CLOUDY (A) 09/24/2022 0051   LABSPEC 1.025 09/24/2022 0051   PHURINE 5.0 09/24/2022 0051   GLUCOSEU NEGATIVE 09/24/2022 0051   HGBUR NEGATIVE 09/24/2022 0051   BILIRUBINUR NEGATIVE 09/24/2022 0051   KETONESUR 5 (A) 09/24/2022 0051   PROTEINUR 100 (A) 09/24/2022 0051   NITRITE NEGATIVE 09/24/2022 0051   LEUKOCYTESUR NEGATIVE 09/24/2022 0051   Sepsis Labs: @LABRCNTIP (procalcitonin:4,lacticidven:4) )No results found for this or any previous visit (from the past 240 hour(s)).   Radiological Exams on Admission: DG Chest Port 1 View  Result Date: 09/24/2023 CLINICAL DATA:  Fever, detox EXAM: PORTABLE CHEST 1 VIEW COMPARISON:  05/16/2023 FINDINGS: Lungs are clear.  No pleural effusion or pneumothorax. The heart is normal in size. IMPRESSION: No acute cardiopulmonary disease.  Electronically Signed   By: Charline Bills M.D.   On: 09/24/2023 21:36      Assessment/Plan Principal Problem:   Alcohol withdrawal (HCC) Active Problems:   SIRS (systemic inflammatory response syndrome) (HCC)   Hypokalemia   Hypomagnesemia   Hypophosphatemia   HTN (hypertension)   Pancytopenia (HCC)   Nausea vomiting and diarrhea   Tobacco abuse   Anxiety and depression   Assessment and Plan:   Alcohol withdrawal [facility]): Patient is tremulous, currently no seizure.  CIWA score 16.  Patient has history of seizure. -Admitted to telemetry bed as inpatient.   -Librium 50 mg, then 25 mg 3 times daily -CIWA protocol started -Did counseling about importance of quitting alcohol abuse -Consult TOC -IV fluid: 2 L normal saline  SIRS: Patient is SIRS with WBC 3.7, RR 24.  Also has fever 100.1.  no clear source of infection is identified. -Check urinalysis -Follow-up chest x-ray --> negative -Blood culture -Check COVID PCR -check procalcitonin level and lactic acid level. -IVF: 2L NS, then 100 cc/h    Tobacco abuse: -Nicotine patch -Did counseling about importance of quitting tobacco   Electrolyte disturbance including hypokalemia, hypomagnesemia and hypophosphatemia: Potassium 3.0, magnesium 1.1, phosphorus 2.2 -Repleted potassium, phosphorus and magnesium -Consulted pharmacy for electrolytes disturbance   HTN (hypertension) -Lotensin -IV hydralazine as needed   Pancytopenia: Etiology is not clear.  Patient has chronic thrombocytopenia, which may be related to alcohol abuse.  Denies rectal bleeding or dark stool.  Hemoglobin 12.5, WBC 3.7, platelet 119. -Follow-up with CBC   Transaminitis: AST 87 ALT 61 AST 41, total bili 1.6. Likely due to alcohol abuse. Pt had right upper quadrant ultrasound on 09/08/2023 which showed steatosis. -Avoid using Tylenol   Anxiety and depression -Continue home medications   Nausea vomiting and diarrhea:  -check C diff -Start Protonix  empirically -IV fluid -As needed Zofran    DVT ppx: SCD  Code Status: Full code   Family Communication: not done, no family member is at bed side.    Disposition Plan:  Anticipate discharge back to previous environment  Consults called:  none  Admission status and Level of care: Telemetry Medical:  as inpt       Dispo: The patient is from: Home              Anticipated d/c is to: Home              Anticipated d/c date is: 2 days  Patient currently is not medically stable to d/c.    Severity of Illness:  The appropriate patient status for this patient is INPATIENT. Inpatient status is judged to be reasonable and necessary in order to provide the required intensity of service to ensure the patient's safety. The patient's presenting symptoms, physical exam findings, and initial radiographic and laboratory data in the context of their chronic comorbidities is felt to place them at high risk for further clinical deterioration. Furthermore, it is not anticipated that the patient will be medically stable for discharge from the hospital within 2 midnights of admission.   * I certify that at the point of admission it is my clinical judgment that the patient will require inpatient hospital care spanning beyond 2 midnights from the point of admission due to high intensity of service, high risk for further deterioration and high frequency of surveillance required.*       Date of Service 09/25/2023    Lorretta Harp [facility]   If 7PM-7AM, please contact night-coverage [email or web address] 09/25/2023, 12:35 AM

## 2023-09-24 NOTE — ED Notes (Signed)
Pt is calm, making conversation and cooperative

## 2023-09-25 DIAGNOSIS — R112 Nausea with vomiting, unspecified: Secondary | ICD-10-CM

## 2023-09-25 DIAGNOSIS — F419 Anxiety disorder, unspecified: Secondary | ICD-10-CM

## 2023-09-25 DIAGNOSIS — Z720 Tobacco use: Secondary | ICD-10-CM

## 2023-09-25 DIAGNOSIS — R6510 Systemic inflammatory response syndrome (SIRS) of non-infectious origin without acute organ dysfunction: Secondary | ICD-10-CM

## 2023-09-25 DIAGNOSIS — F32A Depression, unspecified: Secondary | ICD-10-CM

## 2023-09-25 DIAGNOSIS — E876 Hypokalemia: Secondary | ICD-10-CM

## 2023-09-25 DIAGNOSIS — F10930 Alcohol use, unspecified with withdrawal, uncomplicated: Secondary | ICD-10-CM

## 2023-09-25 DIAGNOSIS — R197 Diarrhea, unspecified: Secondary | ICD-10-CM

## 2023-09-25 DIAGNOSIS — I10 Essential (primary) hypertension: Secondary | ICD-10-CM

## 2023-09-25 DIAGNOSIS — D61818 Other pancytopenia: Secondary | ICD-10-CM

## 2023-09-25 LAB — BASIC METABOLIC PANEL
Anion gap: 8 (ref 5–15)
BUN: 8 mg/dL (ref 6–20)
CO2: 25 mmol/L (ref 22–32)
Calcium: 8.3 mg/dL — ABNORMAL LOW (ref 8.9–10.3)
Chloride: 106 mmol/L (ref 98–111)
Creatinine, Ser: 0.6 mg/dL — ABNORMAL LOW (ref 0.61–1.24)
GFR, Estimated: >60 mL/min (ref >60)
Glucose, Bld: 123 mg/dL — ABNORMAL HIGH (ref 70–99)
Potassium: 3.1 mmol/L — ABNORMAL LOW (ref 3.5–5.1)
Sodium: 139 mmol/L (ref 135–145)

## 2023-09-25 LAB — URINALYSIS, W/ REFLEX TO CULTURE (INFECTION SUSPECTED)
Bacteria, UA: NONE SEEN
Bilirubin Urine: NEGATIVE
Glucose, UA: 150 mg/dL — AB
Hgb urine dipstick: NEGATIVE
Ketones, ur: NEGATIVE mg/dL
Leukocytes,Ua: NEGATIVE
Nitrite: NEGATIVE
Protein, ur: NEGATIVE mg/dL
Specific Gravity, Urine: 1.015 (ref 1.005–1.030)
pH: 7 (ref 5.0–8.0)

## 2023-09-25 LAB — URINE DRUG SCREEN, QUALITATIVE (ARMC ONLY)
Amphetamines, Ur Screen: NOT DETECTED
Barbiturates, Ur Screen: NOT DETECTED
Benzodiazepine, Ur Scrn: POSITIVE — AB
Cannabinoid 50 Ng, Ur ~~LOC~~: NOT DETECTED
Cocaine Metabolite,Ur ~~LOC~~: NOT DETECTED
MDMA (Ecstasy)Ur Screen: NOT DETECTED
Methadone Scn, Ur: NOT DETECTED
Opiate, Ur Screen: NOT DETECTED
Phencyclidine (PCP) Ur S: NOT DETECTED
Tricyclic, Ur Screen: NOT DETECTED

## 2023-09-25 LAB — CBC
HCT: 34.2 % — ABNORMAL LOW (ref 39.0–52.0)
Hemoglobin: 12.4 g/dL — ABNORMAL LOW (ref 13.0–17.0)
MCH: 35 pg — ABNORMAL HIGH (ref 26.0–34.0)
MCHC: 36.3 g/dL — ABNORMAL HIGH (ref 30.0–36.0)
MCV: 96.6 fL (ref 80.0–100.0)
Platelets: 111 10*3/uL — ABNORMAL LOW (ref 150–400)
RBC: 3.54 MIL/uL — ABNORMAL LOW (ref 4.22–5.81)
RDW: 11.9 % (ref 11.5–15.5)
WBC: 2.7 10*3/uL — ABNORMAL LOW (ref 4.0–10.5)
nRBC: 0 % (ref 0.0–0.2)

## 2023-09-25 LAB — PHOSPHORUS: Phosphorus: 2.4 mg/dL — ABNORMAL LOW (ref 2.5–4.6)

## 2023-09-25 LAB — LACTIC ACID, PLASMA
Lactic Acid, Venous: 1.2 mmol/L (ref 0.5–1.9)
Lactic Acid, Venous: 2.1 mmol/L (ref 0.5–1.9)

## 2023-09-25 LAB — MAGNESIUM: Magnesium: 2.2 mg/dL (ref 1.7–2.4)

## 2023-09-25 LAB — C DIFFICILE QUICK SCREEN W PCR REFLEX
C Diff antigen: NEGATIVE
C Diff interpretation: NOT DETECTED
C Diff toxin: NEGATIVE

## 2023-09-25 LAB — HIV ANTIBODY (ROUTINE TESTING W REFLEX): HIV Screen 4th Generation wRfx: NONREACTIVE

## 2023-09-25 MED ORDER — K PHOS MONO-SOD PHOS DI & MONO 155-852-130 MG PO TABS
500.0000 mg | ORAL_TABLET | ORAL | Status: DC
Start: 2023-09-25 — End: 2023-09-25
  Administered 2023-09-25: 500 mg via ORAL
  Filled 2023-09-25 (×2): qty 2

## 2023-09-25 MED ORDER — POTASSIUM CHLORIDE CRYS ER 20 MEQ PO TBCR
40.0000 meq | EXTENDED_RELEASE_TABLET | Freq: Once | ORAL | Status: AC
  Administered 2023-09-25: 40 meq via ORAL
  Filled 2023-09-25: qty 2

## 2023-09-25 NOTE — Consult Note (Signed)
PHARMACY CONSULT NOTE - ELECTROLYTES  Pharmacy Consult for Electrolyte Monitoring and Replacement   Recent Labs: Height: 5\' 7"  (170.2 cm) Weight: 87.4 kg (192 lb 10.9 oz) (09/06/23 office visit) IBW/kg (Calculated) : 66.1 Estimated Creatinine Clearance: 130.8 mL/min (A) (by C-G formula based on SCr of 0.6 mg/dL (L)). Potassium (mmol/L)  Date Value  09/25/2023 3.1 (L)   Magnesium (mg/dL)  Date Value  24/40/1027 2.2   Calcium (mg/dL)  Date Value  25/36/6440 8.3 (L)   Albumin (g/dL)  Date Value  34/74/2595 3.8   Phosphorus (mg/dL)  Date Value  63/87/5643 2.4 (L)   Sodium (mmol/L)  Date Value  09/25/2023 139    Assessment  Melvin Knight is a 42 y.o. male presenting with alcohol withdrawal. PMH significant for alcohol abuse, HTN, obesity, depression, thrombocytopenia. Pharmacy has been consulted to monitor and replace electrolytes.  Diet: heart healthy  Goal of Therapy: Electrolytes WNL  Plan:  K 3.1, Phos 2.4: Kcl PO x 1, Kphos 500mg  PO x 2 Check BMP, Mg, Phos with AM labs  Thank you for allowing pharmacy to be a part of this patient's care.  Bettey Costa, PharmD Clinical Pharmacist 09/25/2023 7:30 AM

## 2023-09-25 NOTE — ED Notes (Signed)
This RN to bedside to introduce self to pt.  Pt is CAOx4, in no acute distress. Pt advised he has been a drinker since college. He  has tried to detox by himself and was not successful. He thought he would now try it through the hospital.

## 2023-09-25 NOTE — ED Notes (Signed)
Patient loss control of his bowels onto the floor in the hallway. Patient given clean pants and underwear and wipes in order to clean himself up.

## 2023-09-25 NOTE — Discharge Summary (Signed)
Physician Discharge Summary   Patient: Haston Casebolt MRN: [id number] DOB: 02-11-1981  Admit date:     09/24/2023  Discharge date: 09/25/23  Discharge Physician: Marcelino Duster   PCP: Margarita Mail, DO   Recommendations at discharge:    PCP follow up in 1 week  Discharge Diagnoses: Principal Problem:   Alcohol withdrawal (HCC) Active Problems:   SIRS (systemic inflammatory response syndrome) (HCC)   Hypokalemia   Hypomagnesemia   Hypophosphatemia   HTN (hypertension)   Pancytopenia (HCC)   Nausea vomiting and diarrhea   Tobacco abuse   Anxiety and depression  Resolved Problems:   * No resolved hospital problems. *  Hospital Course: Yancey Pedley is a 42 y.o. male with medical history significant of tobacco abuse, alcohol abuse, hypertension, obesity, depression with anxiety, thrombocytopenia, seizure, hypokalemic periodic paralysis, who presents with alcohol withdrawal.    Pt has history of alcohol withdrawal seizures.  He has been through detox twice in the past.  He had relapsed into drinking, but had decided to stop again.  Last drink was yesterday 10:00 PM. He becomes tremulous since this morning.  No seizure.  No fall. He reports being unable to stand up and walk due to being off balance.  Patient has nausea, nonbilious nonbloody vomiting and several episodes of diarrhea.  No abdominal pain. No chest pain, cough, SOB.  Patient denies subjective fever and chills, but he is found to have fever with temperature 100.1 in ED.  No rectal bleeding or dark stool.  Patient is admitted to the hospitalist service under CIWA protocol.  Patient withdrawal symptoms much improved, vital stable.  He is able to ambulate and wishes to go home.  He is hemodynamically stable to be discharged home.  Patient understands to follow-up with PCP for further management evaluation of alcohol abuse.  Patient states that he will not be drinking alcohol and will be participating in AA  program.        Consultants: none Procedures performed: none  Disposition: Home Diet recommendation:  Discharge Diet Orders (From admission, onward)     Start     Ordered   09/25/23 0000  Diet - low sodium heart healthy        09/25/23 1313           Cardiac diet DISCHARGE MEDICATION: Allergies as of 09/25/2023   No Known Allergies      Medication List     TAKE these medications    escitalopram 20 MG tablet Commonly known as: Lexapro Take 1 tablet (20 mg total) by mouth daily.   folic acid 1 MG tablet Commonly known as: FOLVITE Take 1 tablet (1 mg total) by mouth daily.   hydrALAZINE 50 MG tablet Commonly known as: APRESOLINE Take 1 tablet (50 mg total) by mouth 3 (three) times daily as needed (If systolic BP greater than 150 mmHg.). If systolic BP greater than 150 mmHg.   hydrocortisone 1 % ointment Apply 1 Application topically 2 (two) times daily.   hydrOXYzine 10 MG tablet Commonly known as: ATARAX Take 1 tablet (10 mg total) by mouth 3 (three) times daily as needed for anxiety.   losartan 50 MG tablet Commonly known as: COZAAR Take 1 tablet (50 mg total) by mouth daily.   MAGNESIUM PO Take by mouth.   multivitamin with minerals tablet Take 1 tablet by mouth daily.   POTASSIUM PO Take by mouth.   thiamine 100 MG tablet Commonly known as: Vitamin B-1 Take 1 tablet (100 mg total)  by mouth daily.   vitamin B-12 100 MCG tablet Commonly known as: CYANOCOBALAMIN Take 100 mcg by mouth daily.   Vitamin D (Ergocalciferol) 1.25 MG (50000 UNIT) Caps capsule Commonly known as: DRISDOL Take 1 capsule (50,000 Units total) by mouth every 7 (seven) days.        Follow-up Information     Margarita Mail, DO Follow up in 1 week(s).   Specialty: Internal Medicine Contact information: [address] [place] [address] [phone]                Discharge Exam: Ceasar Mons Weights   09/24/23 2100  Weight: 87.4 kg    General -  Young Caucasian male, no apparent distress HEENT - PERRLA, EOMI, atraumatic head, non tender sinuses. Lung - Clear, rales, rhonchi, wheezes. Heart - S1, S2 heard, no murmurs, rubs, trace pedal edema Neuro - Alert, awake and oriented x 3, non focal exam.  No withdrawal symptoms Skin - Warm and dry.  Condition at discharge: stable  The results of significant diagnostics from this hospitalization (including imaging, microbiology, ancillary and laboratory) are listed below for reference.   Imaging Studies: DG Chest Port 1 View  Result Date: 09/24/2023 CLINICAL DATA:  Fever, detox EXAM: PORTABLE CHEST 1 VIEW COMPARISON:  05/16/2023 FINDINGS: Lungs are clear.  No pleural effusion or pneumothorax. The heart is normal in size. IMPRESSION: No acute cardiopulmonary disease. Electronically Signed   By: Charline Bills M.D.   On: 09/24/2023 21:36   US Abdomen Limited RUQ (LIVER/GB)  Result Date: 09/08/2023 CLINICAL DATA:  Evaluate for cirrhosis EXAM: ULTRASOUND ABDOMEN LIMITED RIGHT UPPER QUADRANT COMPARISON:  Ultrasound abdomen 08/06/2022 FINDINGS: Gallbladder: No gallstones or wall thickening visualized. No sonographic Murphy sign noted by sonographer. Common bile duct: Diameter: 3 mm Liver: Increased echogenicity. No focal lesion. Portal vein is patent on color Doppler imaging with normal direction of blood flow towards the liver. Other: None. IMPRESSION: 1. Increased hepatic parenchymal echogenicity suggestive of steatosis. 2. No cholelithiasis or sonographic evidence for acute cholecystitis. Electronically Signed   By: Annia Belt M.D.   On: 09/08/2023 13:38    Microbiology: Results for orders placed or performed during the hospital encounter of 09/24/23  Culture, blood (x 2)     Status: None (Preliminary result)   Collection Time: 09/24/23 11:35 PM   Specimen: BLOOD  Result Value Ref Range Status   Specimen Description BLOOD BLOOD LEFT ARM  Final   Special Requests   Final     BOTTLES DRAWN AEROBIC AND ANAEROBIC Blood Culture adequate volume   Culture   Final    NO GROWTH < 12 HOURS Performed at [facility], [address], [place], [place] [address]    Report Status PENDING  Incomplete  C Difficile Quick Screen w PCR reflex     Status: None   Collection Time: 09/25/23  1:57 AM   Specimen: Stool  Result Value Ref Range Status   C Diff antigen NEGATIVE NEGATIVE Final   C Diff toxin NEGATIVE NEGATIVE Final   C Diff interpretation No C. difficile detected.  Final    Comment: Performed at [facility], [address] [place], [place] [address]    Labs: CBC: Recent Labs  Lab 09/24/23 2021 09/25/23 0430  WBC 3.7* 2.7*  HGB 12.5* 12.4*  HCT 33.9* 34.2*  MCV 95.0 96.6  PLT 119* 111*   Basic Metabolic Panel: Recent Labs  Lab 09/24/23 2021 09/25/23 0430  NA 140 139  K 3.0*  3.1*  CL 105 106  CO2 26 25  GLUCOSE 113* 123*  BUN 10 8  CREATININE 0.69 0.60*  CALCIUM 8.3* 8.3*  MG 1.1* 2.2  PHOS 2.2* 2.4*   Liver Function Tests: Recent Labs  Lab 09/24/23 2021  AST 61*  ALT 41  ALKPHOS 87  BILITOT 1.6*  PROT 7.4  ALBUMIN 3.8   CBG: No results for input(s): "GLUCAP" in the last 168 hours.  Discharge time spent: 37 minutes.  Signed: Marcelino Duster, MD [facility] 09/25/2023

## 2023-09-26 ENCOUNTER — Telehealth: Payer: Self-pay

## 2023-09-26 NOTE — Transitions of Care (Post Inpatient/ED Visit) (Signed)
09/26/2023  Name: Melvin Knight MRN: [id number] DOB: 13-Aug-1981  Today's TOC FU Call Status: Today's TOC FU Call Status:: Successful TOC FU Call Completed TOC FU Call Complete Date: 09/26/23 Patient's Name and Date of Birth confirmed.  Transition Care Management Follow-up Telephone Call Date of Discharge: 09/25/23 Discharge Facility: [facility]) Type of Discharge: Inpatient Admission Primary Inpatient Discharge Diagnosis:: alcohol dependence How have you been since you were released from the hospital?: Better Any questions or concerns?: No  Items Reviewed: Did you receive and understand the discharge instructions provided?: Yes Medications obtained,verified, and reconciled?: Yes (Medications Reviewed) Any new allergies since your discharge?: No Dietary orders reviewed?: Yes Do you have support at home?: No  Medications Reviewed Today: Medications Reviewed Today     Reviewed by Karena Addison, LPN (Licensed Practical Nurse) on 09/26/23 at 1349  Med List Status: <None>   Medication Order Taking? Sig Documenting Provider Last Dose Status Informant  escitalopram (LEXAPRO) 20 MG tablet [id number] No Take 1 tablet (20 mg total) by mouth daily. Gillis Santa, MD Unknown Unknown Active Self  folic acid (FOLVITE) 1 MG tablet [id number] No Take 1 tablet (1 mg total) by mouth daily. Pennie Banter, DO Unknown Unknown Active Self  hydrALAZINE (APRESOLINE) 50 MG tablet [id number] No Take 1 tablet (50 mg total) by mouth 3 (three) times daily as needed (If systolic BP greater than 150 mmHg.). If systolic BP greater than 150 mmHg. Gillis Santa, MD prn prn Active Self  hydrocortisone 1 % ointment [id number] No Apply 1 Application topically 2 (two) times daily. Margarita Mail, DO Unknown Unknown Active Self  hydrOXYzine (ATARAX) 10 MG tablet [id number] No Take 1 tablet (10 mg total) by mouth 3 (three) times daily as needed for anxiety. Margarita Mail, DO prn prn  Active Self  losartan (COZAAR) 50 MG tablet [id number] No Take 1 tablet (50 mg total) by mouth daily. Gillis Santa, MD Unknown Unknown Active Self  MAGNESIUM PO [id number] No Take by mouth. [provider] Unknown Unknown Active Self           Med Note Shayne Alken   XNA Sep 24, 2023  9:27 PM) Takes OTC magnesium supplement  Multiple Vitamins-Minerals (MULTIVITAMIN WITH MINERALS) tablet [id number] No Take 1 tablet by mouth daily. Margarita Mail, DO Unknown Unknown Active Self  POTASSIUM PO [id number] No Take by mouth. [provider] Unknown Unknown Active Self           Med Note Shayne Alken   SEG Sep 24, 2023  9:27 PM) Takes OTC potassium supplement  thiamine (VITAMIN B-1) 100 MG tablet [id number] No Take 1 tablet (100 mg total) by mouth daily. Gillis Santa, MD Unknown Unknown Active Self  vitamin B-12 (CYANOCOBALAMIN) 100 MCG tablet [id number] No Take 100 mcg by mouth daily. [provider] Unknown Unknown Active Self  Vitamin D, Ergocalciferol, (DRISDOL) 1.25 MG (50000 UNIT) CAPS capsule [id number] No Take 1 capsule (50,000 Units total) by mouth every 7 (seven) days. Gillis Santa, MD Unknown Unknown Active Self            Home Care and Equipment/Supplies: Were Home Health Services Ordered?: NA Any new equipment or medical supplies ordered?: NA  Functional Questionnaire: Do you need assistance with bathing/showering or dressing?: No Do you need assistance with meal preparation?: No Do you need assistance with eating?: No Do you have difficulty maintaining continence: No Do you need assistance with getting out of bed/getting out of a chair/moving?: No Do you  have difficulty managing or taking your medications?: No  Follow up appointments reviewed: PCP Follow-up appointment confirmed?: No (no avail appt. sent message to staff to schedule) MD Provider Line Number:[phone] Given: No Specialist Hospital Follow-up appointment confirmed?: Yes Date of  Specialist follow-up appointment?: 09/28/23 Follow-Up Specialty Provider:: psch Do you need transportation to your follow-up appointment?: No Do you understand care options if your condition(s) worsen?: Yes-patient verbalized understanding    SIGNATURE Karena Addison, LPN [facility] Nurse Health Advisor Direct Dial [phone]

## 2023-09-29 LAB — CULTURE, BLOOD (ROUTINE X 2)
Culture: NO GROWTH
Special Requests: ADEQUATE

## 2023-09-30 LAB — CULTURE, BLOOD (ROUTINE X 2)
Culture: NO GROWTH
Special Requests: ADEQUATE

## 2023-10-04 ENCOUNTER — Telehealth: Payer: Self-pay

## 2023-10-06 ENCOUNTER — Encounter: Payer: Self-pay | Admitting: Internal Medicine

## 2023-10-06 ENCOUNTER — Ambulatory Visit (INDEPENDENT_AMBULATORY_CARE_PROVIDER_SITE_OTHER): Payer: BLUE CROSS/BLUE SHIELD | Admitting: Internal Medicine

## 2023-10-06 VITALS — BP 142/94 | HR 103 | Temp 98.9°F | Resp 18 | Ht 67.0 in | Wt 195.5 lb

## 2023-10-06 DIAGNOSIS — E559 Vitamin D deficiency, unspecified: Secondary | ICD-10-CM

## 2023-10-06 DIAGNOSIS — F1020 Alcohol dependence, uncomplicated: Secondary | ICD-10-CM

## 2023-10-06 DIAGNOSIS — D61818 Other pancytopenia: Secondary | ICD-10-CM

## 2023-10-06 DIAGNOSIS — E876 Hypokalemia: Secondary | ICD-10-CM

## 2023-10-06 DIAGNOSIS — I10 Essential (primary) hypertension: Secondary | ICD-10-CM

## 2023-10-06 DIAGNOSIS — Z09 Encounter for follow-up examination after completed treatment for conditions other than malignant neoplasm: Secondary | ICD-10-CM

## 2023-10-06 NOTE — Progress Notes (Signed)
Established Patient Office Visit  Subjective:  Patient ID: Melvin Knight, male    DOB: 03/26/1981  Age: 42 y.o. MRN: [id number]  CC:  Chief Complaint  Patient presents with   Hospitalization Follow-up    HPI Melvin Knight presents for hospital follow up.  Discharge Date: 09/25/2023 Diagnosis: alcohol withdrawal, electrolyte disturbances, pancytopenia, SIRS Procedures/tests: Potassium 3.1, calcium 8.2, WBC 2.7, hemoglobin 12.4, platelets 111 Consultants: None New medications: Hydralazine 50 mg 3 times daily Discontinued medications: None Discharge instructions:  Recheck labs Status: better   Hypertension: -Medications: Hydralazine 50 mg once daily, Losartan 50 mg -Had been on Benazepril-HCTZ 20-12.5 mg, Metoprolol 25 mg daily - per patient once he stopped drinking he was becoming light headed in rehab, so all of his blood pressure medications were discontinued. -Denies any SOB, CP, vision changes, LE edema but does occasionally get lightheaded.   Elevated LFTs/Alcohol Use Disorder: -Liver function 09/24/2023, AST 61, ALT 41, alk phos 87 -He is currently on a multivitamin and B complex vitamin, as well as folate and thiamine  -Last drink 09/23/23 -Now following with Psychiatry, had been prescribed naltrexone but is not currently taking  Stress/MDD: -Mood status: Fluctuating -Current treatment: Lexapro 20 mg daily    Vitamin D Deficiency: -Now on 50,000 international units  weekly  Past Medical History:  Diagnosis Date   AKI (acute kidney injury) (HCC) 09/24/2022   Anxiety    Hypertension    Hypokalemia    Hypokalemia 09/24/2022   Hypomagnesemia 09/24/2022    History reviewed. No pertinent surgical history.  Family History  Problem Relation Age of Onset   Hypertension Father    Stroke Father    Cancer Sister     Social History   Socioeconomic History   Marital status: Single    Spouse name: Not on file   Number of children: Not on file   Years of  education: Not on file   Highest education level: Bachelor's degree (e.g., BA, AB, BS)  Occupational History   Not on file  Tobacco Use   Smoking status: Every Day    Current packs/day: 1.00    Types: Cigarettes   Smokeless tobacco: Never  Vaping Use   Vaping status: Never Used  Substance and Sexual Activity   Alcohol use: Yes    Alcohol/week: 6.0 standard drinks of alcohol    Types: 6 Shots of liquor per week    Comment: 6 cocktails per day   Drug use: Not Currently   Sexual activity: Not Currently  Other Topics Concern   Not on file  Social History Narrative   Right handed    Social Determinants of Health   Financial Resource Strain: Low Risk  (03/16/2023)   Overall Financial Resource Strain (CARDIA)    Difficulty of Paying Living Expenses: Not hard at all  Food Insecurity: No Food Insecurity (09/06/2023)   Hunger Vital Sign    Worried About Running Out of Food in the Last Year: Never true    Ran Out of Food in the Last Year: Never true  Transportation Needs: No Transportation Needs (09/06/2023)   PRAPARE - Administrator, Civil Service (Medical): No    Lack of Transportation (Non-Medical): No  Physical Activity: Sufficiently Active (03/16/2023)   Exercise Vital Sign    Days of Exercise per Week: 3 days    Minutes of Exercise per Session: 150+ min  Stress: No Stress Concern Present (03/16/2023)   [employer] of Occupational Health - Occupational Stress Questionnaire  Feeling of Stress : Not at all  Social Connections: Unknown (03/16/2023)   Social Connection and Isolation Panel [NHANES]    Frequency of Communication with Friends and Family: More than three times a week    Frequency of Social Gatherings with Friends and Family: Twice a week    Attends Religious Services: Not on Marketing executive or Organizations: No    Attends Banker Meetings: Not on file    Marital Status: Never married  Intimate Partner Violence: Not At  Risk (08/30/2023)   Humiliation, Afraid, Rape, and Kick questionnaire    Fear of Current or Ex-Partner: No    Emotionally Abused: No    Physically Abused: No    Sexually Abused: No    Outpatient Medications Prior to Visit  Medication Sig Dispense Refill   escitalopram (LEXAPRO) 20 MG tablet Take 1 tablet (20 mg total) by mouth daily. 90 tablet 3   folic acid (FOLVITE) 1 MG tablet Take 1 tablet (1 mg total) by mouth daily. 30 tablet 2   hydrocortisone 1 % ointment Apply 1 Application topically 2 (two) times daily. 30 g 0   hydrOXYzine (ATARAX) 10 MG tablet Take 1 tablet (10 mg total) by mouth 3 (three) times daily as needed for anxiety. 30 tablet 0   losartan (COZAAR) 50 MG tablet Take 1 tablet (50 mg total) by mouth daily. 90 tablet 3   MAGNESIUM PO Take by mouth.     Multiple Vitamins-Minerals (MULTIVITAMIN WITH MINERALS) tablet Take 1 tablet by mouth daily. 90 tablet 3   POTASSIUM PO Take by mouth.     vitamin B-12 (CYANOCOBALAMIN) 100 MCG tablet Take 100 mcg by mouth daily.     Vitamin D, Ergocalciferol, (DRISDOL) 1.25 MG (50000 UNIT) CAPS capsule Take 1 capsule (50,000 Units total) by mouth every 7 (seven) days. 12 capsule 0   hydrALAZINE (APRESOLINE) 50 MG tablet Take 1 tablet (50 mg total) by mouth 3 (three) times daily as needed (If systolic BP greater than 150 mmHg.). If systolic BP greater than 150 mmHg. 90 tablet 1   No facility-administered medications prior to visit.    No Known Allergies  ROS Review of Systems  All other systems reviewed and are negative.     Objective:    Physical Exam Constitutional:      Appearance: Normal appearance.  HENT:     Head: Normocephalic and atraumatic.  Eyes:     Conjunctiva/sclera: Conjunctivae normal.  Cardiovascular:     Rate and Rhythm: Normal rate and regular rhythm.  Pulmonary:     Effort: Pulmonary effort is normal.     Breath sounds: Normal breath sounds.  Skin:    General: Skin is warm and dry.  Neurological:      General: No focal deficit present.     Mental Status: He is alert. Mental status is at baseline.  Psychiatric:        Mood and Affect: Mood normal.     BP (!) 142/94   Pulse (!) 103   Temp 98.9 F (37.2 C)   Resp 18   Ht 5\' 7"  (1.702 m)   Wt 195 lb 8 oz (88.7 kg)   SpO2 96%   BMI 30.62 kg/m  Wt Readings from Last 3 Encounters:  10/06/23 195 lb 8 oz (88.7 kg)  09/24/23 192 lb 10.9 oz (87.4 kg)  09/06/23 186 lb 12.8 oz (84.7 kg)     There are no preventive care reminders  to display for this patient.       There are no preventive care reminders to display for this patient.  Lab Results  Component Value Date   TSH 1.562 04/18/2023   Lab Results  Component Value Date   WBC 2.7 (L) 09/25/2023   HGB 12.4 (L) 09/25/2023   HCT 34.2 (L) 09/25/2023   MCV 96.6 09/25/2023   PLT 111 (L) 09/25/2023   Lab Results  Component Value Date   NA 139 09/25/2023   K 3.1 (L) 09/25/2023   CO2 25 09/25/2023   GLUCOSE 123 (H) 09/25/2023   BUN 8 09/25/2023   CREATININE 0.60 (L) 09/25/2023   BILITOT 1.6 (H) 09/24/2023   ALKPHOS 87 09/24/2023   AST 61 (H) 09/24/2023   ALT 41 09/24/2023   PROT 7.4 09/24/2023   ALBUMIN 3.8 09/24/2023   CALCIUM 8.3 (L) 09/25/2023   ANIONGAP 8 09/25/2023   EGFR 115 07/04/2023   Lab Results  Component Value Date   CHOL 258 (H) 01/11/2022   Lab Results  Component Value Date   HDL 61 01/11/2022   Lab Results  Component Value Date   LDLCALC 166 (H) 01/11/2022   Lab Results  Component Value Date   TRIG 162 (H) 01/11/2022   Lab Results  Component Value Date   CHOLHDL 4.2 01/11/2022   Lab Results  Component Value Date   HGBA1C 5.7 (H) 02/08/2022      Assessment & Plan:   1. Hospital discharge follow-up/Alcohol use disorder, severe, dependence (HCC)/Hypocalcemia: Reviewed hospital discharge summary, labs and imaging from 09/25/2023.  Patient states he is no longer drinking, is following with psychiatry but no longer taking naltrexone.   Will repeat labs, electrolytes and vitamins today.  Again discussed importance of alcohol cessation.  - Basic Metabolic Panel (BMET) - CBC w/Diff/Platelet - Vitamin B1 - B12 and Folate Panel - Vitamin D (25 hydroxy)  2. Primary hypertension: Blood pressure elevated today, patient taking hydralazine 50 mg daily will increase to twice daily and continue losartan 50 mg.  Recheck in 2 months.  3. Pancytopenia [facility]): Reviewed labs with the patient, every time he drinks his labs get worse.  Thrombocytopenia on previous labs, pancytopenia is new.  Will recheck labs and place referral to hematology.  - CBC w/Diff/Platelet - Ambulatory referral to Hematology / Oncology  4. Vitamin D deficiency: Recheck levels.  - Vitamin D (25 hydroxy)   Follow-up: Return for already scheduled .    Margarita Mail, DO

## 2023-10-06 NOTE — Patient Instructions (Signed)
It was great seeing you today!  Plan discussed at today's visit: -Blood work ordered today, results will be uploaded to MyChart.  -Continue Losartan 50 mg, increase Hydralazine 50 mg twice daily -No other changes to medications made   Follow up in: 2 months, already scheduled   Take care and let us know if you have any questions or concerns prior to your next visit.  Dr. Caralee Ates

## 2023-10-07 ENCOUNTER — Encounter: Payer: Self-pay | Admitting: Internal Medicine

## 2023-10-10 NOTE — Addendum Note (Signed)
Addended by: Margarita Mail on: 10/10/2023 07:49 AM   Modules accepted: Level of Service

## 2023-10-14 LAB — CBC WITH DIFFERENTIAL/PLATELET
Absolute Lymphocytes: 1070 {cells}/uL (ref 850–3900)
Absolute Monocytes: 790 {cells}/uL (ref 200–950)
Basophils Absolute: 120 {cells}/uL (ref 0–200)
Basophils Relative: 2.4 %
Eosinophils Absolute: 70 {cells}/uL (ref 15–500)
Eosinophils Relative: 1.4 %
HCT: 39.3 % (ref 38.5–50.0)
Hemoglobin: 13.8 g/dL (ref 13.2–17.1)
MCH: 34.8 pg — ABNORMAL HIGH (ref 27.0–33.0)
MCHC: 35.1 g/dL (ref 32.0–36.0)
MCV: 99.2 fL (ref 80.0–100.0)
MPV: 9.8 fL (ref 7.5–12.5)
Monocytes Relative: 15.8 %
Neutro Abs: 2950 {cells}/uL (ref 1500–7800)
Neutrophils Relative %: 59 %
Platelets: 232 10*3/uL (ref 140–400)
RBC: 3.96 10*6/uL — ABNORMAL LOW (ref 4.20–5.80)
RDW: 13.6 % (ref 11.0–15.0)
Total Lymphocyte: 21.4 %
WBC: 5 10*3/uL (ref 3.8–10.8)

## 2023-10-14 LAB — BASIC METABOLIC PANEL
BUN/Creatinine Ratio: 8 (calc) (ref 6–22)
BUN: 6 mg/dL — ABNORMAL LOW (ref 7–25)
CO2: 28 mmol/L (ref 20–32)
Calcium: 8.8 mg/dL (ref 8.6–10.3)
Chloride: 101 mmol/L (ref 98–110)
Creat: 0.8 mg/dL (ref 0.60–1.26)
Glucose, Bld: 96 mg/dL (ref 65–99)
Potassium: 3.3 mmol/L — ABNORMAL LOW (ref 3.5–5.3)
Sodium: 140 mmol/L (ref 135–146)

## 2023-10-14 LAB — VITAMIN B1: Vitamin B1 (Thiamine): 10 nmol/L (ref 8–30)

## 2023-10-14 LAB — B12 AND FOLATE PANEL
Folate: 8.3 ng/mL
Vitamin B-12: 489 pg/mL (ref 200–1100)

## 2023-10-14 LAB — VITAMIN D 25 HYDROXY (VIT D DEFICIENCY, FRACTURES): Vit D, 25-Hydroxy: 75 ng/mL (ref 30–100)

## 2023-10-17 ENCOUNTER — Telehealth: Payer: Self-pay

## 2023-10-31 ENCOUNTER — Inpatient Hospital Stay
Admission: EM | Admit: 2023-10-31 | Discharge: 2023-11-03 | DRG: 897 | Disposition: A | Discharge status: Home / Self Care | Payer: BLUE CROSS/BLUE SHIELD | Attending: Internal Medicine | Admitting: Internal Medicine

## 2023-10-31 ENCOUNTER — Ambulatory Visit: Payer: Self-pay | Admitting: Dermatology

## 2023-10-31 ENCOUNTER — Other Ambulatory Visit: Payer: Self-pay

## 2023-10-31 DIAGNOSIS — F10939 Alcohol use, unspecified with withdrawal, unspecified: Secondary | ICD-10-CM | POA: Diagnosis present

## 2023-10-31 DIAGNOSIS — D6959 Other secondary thrombocytopenia: Secondary | ICD-10-CM | POA: Diagnosis present

## 2023-10-31 DIAGNOSIS — F32A Depression, unspecified: Secondary | ICD-10-CM | POA: Diagnosis present

## 2023-10-31 DIAGNOSIS — Y902 Blood alcohol level of 40-59 mg/100 ml: Secondary | ICD-10-CM | POA: Diagnosis present

## 2023-10-31 DIAGNOSIS — F17210 Nicotine dependence, cigarettes, uncomplicated: Secondary | ICD-10-CM | POA: Diagnosis present

## 2023-10-31 DIAGNOSIS — K709 Alcoholic liver disease, unspecified: Secondary | ICD-10-CM | POA: Diagnosis present

## 2023-10-31 DIAGNOSIS — E878 Other disorders of electrolyte and fluid balance, not elsewhere classified: Secondary | ICD-10-CM | POA: Diagnosis present

## 2023-10-31 DIAGNOSIS — Z8249 Family history of ischemic heart disease and other diseases of the circulatory system: Secondary | ICD-10-CM | POA: Diagnosis not present

## 2023-10-31 DIAGNOSIS — F419 Anxiety disorder, unspecified: Secondary | ICD-10-CM | POA: Diagnosis present

## 2023-10-31 DIAGNOSIS — R945 Abnormal results of liver function studies: Secondary | ICD-10-CM | POA: Diagnosis present

## 2023-10-31 DIAGNOSIS — F10930 Alcohol use, unspecified with withdrawal, uncomplicated: Principal | ICD-10-CM

## 2023-10-31 DIAGNOSIS — R000 Tachycardia, unspecified: Secondary | ICD-10-CM | POA: Insufficient documentation

## 2023-10-31 DIAGNOSIS — E876 Hypokalemia: Secondary | ICD-10-CM | POA: Diagnosis present

## 2023-10-31 DIAGNOSIS — R002 Palpitations: Secondary | ICD-10-CM | POA: Diagnosis present

## 2023-10-31 DIAGNOSIS — Z79899 Other long term (current) drug therapy: Secondary | ICD-10-CM | POA: Diagnosis not present

## 2023-10-31 DIAGNOSIS — K760 Fatty (change of) liver, not elsewhere classified: Secondary | ICD-10-CM | POA: Insufficient documentation

## 2023-10-31 DIAGNOSIS — F10230 Alcohol dependence with withdrawal, uncomplicated: Principal | ICD-10-CM | POA: Diagnosis present

## 2023-10-31 DIAGNOSIS — R1112 Projectile vomiting: Secondary | ICD-10-CM | POA: Diagnosis not present

## 2023-10-31 DIAGNOSIS — I10 Essential (primary) hypertension: Secondary | ICD-10-CM | POA: Diagnosis present

## 2023-10-31 DIAGNOSIS — R7989 Other specified abnormal findings of blood chemistry: Secondary | ICD-10-CM | POA: Diagnosis present

## 2023-10-31 DIAGNOSIS — D696 Thrombocytopenia, unspecified: Secondary | ICD-10-CM | POA: Diagnosis present

## 2023-10-31 DIAGNOSIS — Z720 Tobacco use: Secondary | ICD-10-CM | POA: Diagnosis present

## 2023-10-31 DIAGNOSIS — R7401 Elevation of levels of liver transaminase levels: Secondary | ICD-10-CM | POA: Diagnosis present

## 2023-10-31 DIAGNOSIS — R9431 Abnormal electrocardiogram [ECG] [EKG]: Secondary | ICD-10-CM | POA: Diagnosis present

## 2023-10-31 DIAGNOSIS — M7989 Other specified soft tissue disorders: Secondary | ICD-10-CM

## 2023-10-31 LAB — COMPREHENSIVE METABOLIC PANEL
ALT: 51 U/L — ABNORMAL HIGH (ref 0–44)
AST: 87 U/L — ABNORMAL HIGH (ref 15–41)
Albumin: 4.6 g/dL (ref 3.5–5.0)
Alkaline Phosphatase: 115 U/L (ref 38–126)
Anion gap: 20 — ABNORMAL HIGH (ref 5–15)
BUN: 20 mg/dL (ref 6–20)
CO2: 23 mmol/L (ref 22–32)
Calcium: 9.3 mg/dL (ref 8.9–10.3)
Chloride: 97 mmol/L — ABNORMAL LOW (ref 98–111)
Creatinine, Ser: 1.24 mg/dL (ref 0.61–1.24)
GFR, Estimated: >60 mL/min (ref >60)
Glucose, Bld: 148 mg/dL — ABNORMAL HIGH (ref 70–99)
Potassium: 2.9 mmol/L — ABNORMAL LOW (ref 3.5–5.1)
Sodium: 140 mmol/L (ref 135–145)
Total Bilirubin: 1.5 mg/dL — ABNORMAL HIGH (ref <1.2)
Total Protein: 8.4 g/dL — ABNORMAL HIGH (ref 6.5–8.1)

## 2023-10-31 LAB — CBC
HCT: 36.3 % — ABNORMAL LOW (ref 39.0–52.0)
Hemoglobin: 13.4 g/dL (ref 13.0–17.0)
MCH: 35.4 pg — ABNORMAL HIGH (ref 26.0–34.0)
MCHC: 36.9 g/dL — ABNORMAL HIGH (ref 30.0–36.0)
MCV: 96 fL (ref 80.0–100.0)
Platelets: 110 10*3/uL — ABNORMAL LOW (ref 150–400)
RBC: 3.78 MIL/uL — ABNORMAL LOW (ref 4.22–5.81)
RDW: 12.5 % (ref 11.5–15.5)
WBC: 5.7 10*3/uL (ref 4.0–10.5)
nRBC: 0 % (ref 0.0–0.2)

## 2023-10-31 LAB — ETHANOL: Alcohol, Ethyl (B): 46 mg/dL — ABNORMAL HIGH (ref <10)

## 2023-10-31 LAB — POTASSIUM: Potassium: 3.3 mmol/L — ABNORMAL LOW (ref 3.5–5.1)

## 2023-10-31 LAB — PHOSPHORUS: Phosphorus: 1.2 mg/dL — ABNORMAL LOW (ref 2.5–4.6)

## 2023-10-31 LAB — MAGNESIUM: Magnesium: 1.2 mg/dL — ABNORMAL LOW (ref 1.7–2.4)

## 2023-10-31 MED ORDER — LIDOCAINE VISCOUS HCL 2 % MT SOLN
15.0000 mL | Freq: Once | OROMUCOSAL | Status: AC
  Administered 2023-10-31: 15 mL via ORAL
  Filled 2023-10-31: qty 15

## 2023-10-31 MED ORDER — THIAMINE MONONITRATE 100 MG PO TABS
100.0000 mg | ORAL_TABLET | Freq: Every day | ORAL | Status: DC
  Administered 2023-10-31 – 2023-11-03 (×3): 100 mg via ORAL
  Filled 2023-10-31 (×3): qty 1

## 2023-10-31 MED ORDER — ALUM & MAG HYDROXIDE-SIMETH 200-200-20 MG/5ML PO SUSP
30.0000 mL | Freq: Once | ORAL | Status: AC
  Administered 2023-10-31: 30 mL via ORAL
  Filled 2023-10-31: qty 30

## 2023-10-31 MED ORDER — DIAZEPAM 5 MG/ML IJ SOLN
10.0000 mg | Freq: Once | INTRAMUSCULAR | Status: AC
Start: 2023-10-31 — End: 2023-10-31
  Administered 2023-10-31: 10 mg via INTRAVENOUS
  Filled 2023-10-31: qty 2

## 2023-10-31 MED ORDER — LORAZEPAM 1 MG PO TABS
1.0000 mg | ORAL_TABLET | ORAL | Status: DC | PRN
  Administered 2023-10-31: 1 mg via ORAL
  Filled 2023-10-31: qty 1

## 2023-10-31 MED ORDER — POTASSIUM & SODIUM PHOSPHATES 280-160-250 MG PO PACK
1.0000 | PACK | Freq: Once | ORAL | Status: AC
  Administered 2023-10-31: 1 via ORAL
  Filled 2023-10-31: qty 1

## 2023-10-31 MED ORDER — FOLIC ACID 1 MG PO TABS
1.0000 mg | ORAL_TABLET | Freq: Every day | ORAL | Status: DC
  Administered 2023-10-31 – 2023-11-03 (×4): 1 mg via ORAL
  Filled 2023-10-31 (×4): qty 1

## 2023-10-31 MED ORDER — POTASSIUM PHOSPHATES 15 MMOLE/5ML IV SOLN
15.0000 mmol | Freq: Once | INTRAVENOUS | Status: AC
  Administered 2023-10-31: 15 mmol via INTRAVENOUS
  Filled 2023-10-31: qty 5

## 2023-10-31 MED ORDER — MAGNESIUM SULFATE 2 GM/50ML IV SOLN
2.0000 g | Freq: Once | INTRAVENOUS | Status: AC
  Administered 2023-10-31: 2 g via INTRAVENOUS
  Filled 2023-10-31: qty 50

## 2023-10-31 MED ORDER — ACETAMINOPHEN 325 MG PO TABS
650.0000 mg | ORAL_TABLET | Freq: Four times a day (QID) | ORAL | Status: DC | PRN
Start: 2023-10-31 — End: 2023-11-05

## 2023-10-31 MED ORDER — NICOTINE 21 MG/24HR TD PT24
21.0000 mg | MEDICATED_PATCH | Freq: Every day | TRANSDERMAL | Status: DC | PRN
Start: 2023-10-31 — End: 2023-11-05

## 2023-10-31 MED ORDER — SODIUM CHLORIDE 0.9 % IV BOLUS
1000.0000 mL | Freq: Once | INTRAVENOUS | Status: AC
  Administered 2023-10-31: 1000 mL via INTRAVENOUS

## 2023-10-31 MED ORDER — ONDANSETRON HCL 4 MG PO TABS: 4.0000 mg | ORAL_TABLET | Freq: Four times a day (QID) | ORAL | Status: DC | PRN

## 2023-10-31 MED ORDER — DIAZEPAM 5 MG/ML IJ SOLN
5.0000 mg | Freq: Once | INTRAMUSCULAR | Status: AC
  Administered 2023-10-31: 5 mg via INTRAVENOUS
  Filled 2023-10-31: qty 2

## 2023-10-31 MED ORDER — LORAZEPAM 2 MG/ML IJ SOLN
2.0000 mg | Freq: Once | INTRAMUSCULAR | Status: AC
  Administered 2023-10-31: 2 mg via INTRAVENOUS
  Filled 2023-10-31: qty 1

## 2023-10-31 MED ORDER — HYDRALAZINE HCL 20 MG/ML IJ SOLN: 5.0000 mg | Freq: Four times a day (QID) | INTRAMUSCULAR | Status: DC | PRN

## 2023-10-31 MED ORDER — POTASSIUM CHLORIDE CRYS ER 20 MEQ PO TBCR
40.0000 meq | EXTENDED_RELEASE_TABLET | Freq: Once | ORAL | Status: AC
  Administered 2023-10-31: 40 meq via ORAL
  Filled 2023-10-31: qty 2

## 2023-10-31 MED ORDER — DIAZEPAM 5 MG/ML IJ SOLN
10.0000 mg | Freq: Once | INTRAMUSCULAR | Status: AC | PRN
  Administered 2023-10-31: 10 mg via INTRAVENOUS
  Filled 2023-10-31: qty 2

## 2023-10-31 MED ORDER — ENOXAPARIN SODIUM 40 MG/0.4ML IJ SOSY
40.0000 mg | PREFILLED_SYRINGE | INTRAMUSCULAR | Status: DC
  Administered 2023-10-31 – 2023-11-02 (×3): 40 mg via SUBCUTANEOUS
  Filled 2023-10-31 (×3): qty 0.4

## 2023-10-31 MED ORDER — THIAMINE HCL 100 MG/ML IJ SOLN
100.0000 mg | Freq: Every day | INTRAMUSCULAR | Status: DC
  Administered 2023-11-01: 100 mg via INTRAVENOUS
  Filled 2023-10-31 (×2): qty 2

## 2023-10-31 MED ORDER — ADULT MULTIVITAMIN W/MINERALS CH
1.0000 | ORAL_TABLET | Freq: Every day | ORAL | Status: DC
  Administered 2023-10-31 – 2023-11-03 (×4): 1 via ORAL
  Filled 2023-10-31 (×4): qty 1

## 2023-10-31 MED ORDER — ONDANSETRON HCL 4 MG/2ML IJ SOLN
4.0000 mg | Freq: Four times a day (QID) | INTRAMUSCULAR | Status: DC | PRN
Start: 2023-10-31 — End: 2023-11-05
  Administered 2023-10-31: 4 mg via INTRAVENOUS
  Filled 2023-10-31: qty 2

## 2023-10-31 MED ORDER — SENNOSIDES-DOCUSATE SODIUM 8.6-50 MG PO TABS: 1.0000 | ORAL_TABLET | Freq: Every evening | ORAL | Status: DC | PRN

## 2023-10-31 MED ORDER — POTASSIUM CHLORIDE 10 MEQ/100ML IV SOLN
10.0000 meq | INTRAVENOUS | Status: AC
  Administered 2023-10-31 (×2): 10 meq via INTRAVENOUS
  Filled 2023-10-31 (×2): qty 100

## 2023-10-31 MED ORDER — ACETAMINOPHEN 650 MG RE SUPP
650.0000 mg | Freq: Four times a day (QID) | RECTAL | Status: DC | PRN
Start: 2023-10-31 — End: 2023-11-05

## 2023-10-31 MED ORDER — LORAZEPAM 2 MG/ML IJ SOLN
1.0000 mg | INTRAMUSCULAR | Status: AC | PRN
  Administered 2023-10-31: 3 mg via INTRAVENOUS
  Administered 2023-10-31: 2 mg via INTRAVENOUS
  Administered 2023-11-01 (×2): 4 mg via INTRAVENOUS
  Administered 2023-11-01: 1 mg via INTRAVENOUS
  Filled 2023-10-31: qty 2
  Filled 2023-10-31 (×2): qty 1
  Filled 2023-10-31 (×2): qty 2

## 2023-10-31 NOTE — Assessment & Plan Note (Signed)
Continue CIWA precaution Fall and aspiration precaution initiated Continue sodium chloride 1 L bolus on admission Treat electrolyte depletion as appropriate Check phosphorus, urgent on admission Keep patient n.p.o. pending bedside evaluation Admit to PCU, telemetry

## 2023-10-31 NOTE — ED Notes (Signed)
Pt vomiting at this time. MD made aware. Updated CIWA scale.

## 2023-10-31 NOTE — Assessment & Plan Note (Signed)
Suspect secondary to alcohol withdrawal, treat per above

## 2023-10-31 NOTE — Assessment & Plan Note (Addendum)
Hydralazine 5 g IV q6h prn for SBP > 175, 5 days ordered

## 2023-10-31 NOTE — Hospital Course (Signed)
Mr. Melvin Knight is a 42 year old male with history of alcohol abuse and dependence, alcohol withdrawal, hypertension, depression, anxiety, who presents emergency department for chief concerns of alcohol withdrawals.  Vitals in the ED showed temperature of 98.2, respiration rate 22, heart rate 105, blood pressure 170/1 117, SpO2 100% room air.  Serum sodium is 140, potassium 2.9, chloride 97, bicarb 23, BUN of 20, serum creatinine 1.24, EGFR greater than 60, nonfasting blood glucose 148, WBC 5.7, hemoglobin 13.4, platelets of 110.  EtOH level was 46.  ED treatment: Valium 10 mg IV one-time dose, potassium chloride 10 mEq IV x 2 doses, potassium chloride 40 mEq p.o. one-time dose.  Patient was initiated on CIWA precaution per EDP.  CIWA precaution was continued on admission.

## 2023-10-31 NOTE — ED Provider Notes (Signed)
[facility] Provider Note    Event Date/Time   First MD Initiated Contact with Patient 10/31/23 1114     (approximate)   History   Withdrawal   HPI  Melvin Knight is a 42 y.o. male   Past medical history of Significant  alcohol use tried to detox by himself stop drinking 2 days ago awoke today with acute withdrawal symptoms tremors fasciculations anxiety tachycardia palpitations.  No other drug use.  Was well last night when he went to bed.  This feels like alcohol withdrawal he experienced in the past.  No seizures.   External Medical Documents Reviewed: Hospital records from 09/25/2023 for alcohol withdrawal      Physical Exam   Triage Vital Signs: ED Triage Vitals  Encounter Vitals Group     BP 10/31/23 1105 (!) 170/117     Systolic BP Percentile --      Diastolic BP Percentile --      Pulse Rate 10/31/23 1102 (!) 105     Resp 10/31/23 1102 (!) 22     Temp 10/31/23 1122 98.2 F (36.8 C)     Temp Source 10/31/23 1122 Oral     SpO2 10/31/23 1102 100 %     Weight 10/31/23 1103 195 lb (88.5 kg)     Height 10/31/23 1103 5\' 7"  (1.702 m)     Head Circumference --      Peak Flow --      Pain Score 10/31/23 1102 5     Pain Loc --      Pain Education --      Exclude from Growth Chart --     Most recent vital signs: Vitals:   10/31/23 1122 10/31/23 1314  BP:  (!) 167/111  Pulse:  (!) 101  Resp:    Temp: 98.2 F (36.8 C)   SpO2:      General: Awake, no distress.  CV:  Good peripheral perfusion.  Resp:  Normal effort.  Abd:  No distention.  Other:  Appears to be in acute alcohol withdrawal with tachycardia tongue fasciculations tremors   ED Results / Procedures / Treatments   Labs (all labs ordered are listed, but only abnormal results are displayed) Labs Reviewed  COMPREHENSIVE METABOLIC PANEL - Abnormal; Notable for the following components:      Result Value   Potassium 2.9 (*)    Chloride 97 (*)    Glucose, Bld 148 (*)     Total Protein 8.4 (*)    AST 87 (*)    ALT 51 (*)    Total Bilirubin 1.5 (*)    Anion gap 20 (*)    All other components within normal limits  ETHANOL - Abnormal; Notable for the following components:   Alcohol, Ethyl (B) 46 (*)    All other components within normal limits  CBC - Abnormal; Notable for the following components:   RBC 3.78 (*)    HCT 36.3 (*)    MCH 35.4 (*)    MCHC 36.9 (*)    Platelets 110 (*)    All other components within normal limits  MAGNESIUM - Abnormal; Notable for the following components:   Magnesium 1.2 (*)    All other components within normal limits  URINE DRUG SCREEN, QUALITATIVE ([facility] ONLY)  PHOSPHORUS  POTASSIUM     I ordered and reviewed the above labs they are notable for hypokalemia 2.9.  Hypomagnesemia  EKG  ED ECG REPORT I, Pilar Jarvis, the attending  physician, personally viewed and interpreted this ECG.   Date: 10/31/2023  EKG Time: 1106  Rate: 107  Rhythm: sinus tachycardia  Axis: nl  Intervals: long qtc   ST&T Change: NO STEMI    PROCEDURES:  Critical Care performed: Yes, see critical care procedure note(s)  .Critical Care  Performed by: Pilar Jarvis, MD Authorized by: Pilar Jarvis, MD   Critical care provider statement:    Critical care time (minutes):  30   Critical care was time spent personally by me on the following activities:  Development of treatment plan with patient or surrogate, discussions with consultants, evaluation of patient's response to treatment, examination of patient, ordering and review of laboratory studies, ordering and review of radiographic studies, ordering and performing treatments and interventions, pulse oximetry, re-evaluation of patient's condition and review of old charts    MEDICATIONS ORDERED IN ED: Medications  LORazepam (ATIVAN) tablet 1-4 mg (has no administration in time range)    Or  LORazepam (ATIVAN) injection 1-4 mg (has no administration in time range)  thiamine (VITAMIN B1)  tablet 100 mg (100 mg Oral Given 10/31/23 1307)    Or  thiamine (VITAMIN B1) injection 100 mg ( Intravenous See Alternative 10/31/23 1307)  folic acid (FOLVITE) tablet 1 mg (1 mg Oral Given 10/31/23 1307)  multivitamin with minerals tablet 1 tablet (1 tablet Oral Given 10/31/23 1307)  potassium chloride 10 mEq in 100 mL IVPB (10 mEq Intravenous New Bag/Given 10/31/23 1328)  acetaminophen (TYLENOL) tablet 650 mg (has no administration in time range)    Or  acetaminophen (TYLENOL) suppository 650 mg (has no administration in time range)  ondansetron (ZOFRAN) tablet 4 mg (has no administration in time range)    Or  ondansetron (ZOFRAN) injection 4 mg (has no administration in time range)  enoxaparin (LOVENOX) injection 40 mg (has no administration in time range)  senna-docusate (Senokot-S) tablet 1 tablet (has no administration in time range)  nicotine (NICODERM CQ - dosed in mg/24 hours) patch 21 mg (has no administration in time range)  magnesium sulfate IVPB 2 g 50 mL (2 g Intravenous New Bag/Given 10/31/23 1328)  hydrALAZINE (APRESOLINE) injection 5 mg (has no administration in time range)  diazepam (VALIUM) injection 10 mg (10 mg Intravenous Given 10/31/23 1117)  diazepam (VALIUM) injection 10 mg (10 mg Intravenous Given 10/31/23 1127)  diazepam (VALIUM) injection 5 mg (5 mg Intravenous Given 10/31/23 1316)  potassium chloride SA (KLOR-CON M) CR tablet 40 mEq (40 mEq Oral Given 10/31/23 1307)  sodium chloride 0.9 % bolus 1,000 mL (1,000 mLs Intravenous New Bag/Given 10/31/23 1326)  alum & mag hydroxide-simeth (MAALOX/MYLANTA) 200-200-20 MG/5ML suspension 30 mL (30 mLs Oral Given 10/31/23 1307)    And  lidocaine (XYLOCAINE) 2 % viscous mouth solution 15 mL (15 mLs Oral Given 10/31/23 1307)    External physician / consultants:  I spoke with hospital medicine doctor for admission and regarding care plan for this patient.   IMPRESSION / MDM / ASSESSMENT AND PLAN / ED COURSE  I reviewed the triage  vital signs and the nursing notes.                                Patient's presentation is most consistent with acute presentation with potential threat to life or bodily function.  Differential diagnosis includes, but is not limited to, acute alcohol withdrawal, electrolyte disturbance, dehydration   The patient is on the cardiac monitor to evaluate  for evidence of arrhythmia and/or significant heart rate changes.  MDM:    Is a patient with significant alcohol withdrawal treated with 20 mg of IV Valium with much improvement in symptomatology.  He has been nauseous and vomiting and has hypokalemia with EKG changes significant for prolonged QTc.  Fluids and electrolyte repletion.  Admission on CIWA.        FINAL CLINICAL IMPRESSION(S) / ED DIAGNOSES   Final diagnoses:  Alcohol withdrawal syndrome without complication (HCC)  Hypokalemia  Long QT interval     Rx / DC Orders   ED Discharge Orders     None        Note:  This document was prepared using Dragon voice recognition software and may include unintentional dictation errors.    Pilar Jarvis, MD 10/31/23 1351

## 2023-10-31 NOTE — Assessment & Plan Note (Addendum)
Check phosphorus level, urgent on admission; and we will replace as appropriate Pending collection at the time of this dictation.  Called lab, and lab confirmed that they will collect this now.

## 2023-10-31 NOTE — ED Triage Notes (Signed)
Pt to ED via [facility] for alcohol withdrawal. Per EMS, last drink was yesterday. Pt reports normally drinks 5th of whiskey. Per EMS pt is having nausea and vomiting. Pt tremulous at this time. Denies hallucinations. Pt reports headache.   EMS vitals BP 157/95 107 HR  SPO2 98% cbg 167

## 2023-10-31 NOTE — Assessment & Plan Note (Signed)
Secondary to chronic alcohol use complicated by hepatic steatosis

## 2023-10-31 NOTE — Assessment & Plan Note (Signed)
Secondary to chronic alcohol use, treat per above and recheck in the a.m. as appropriate

## 2023-10-31 NOTE — Assessment & Plan Note (Signed)
-   As needed nicotine patch ordered ?

## 2023-10-31 NOTE — Assessment & Plan Note (Signed)
Patient's platelet range 84-232 in the last 2 months; presumed secondary to chronic alcohol use Currently at baseline Recheck CBC in the a.m.

## 2023-10-31 NOTE — Assessment & Plan Note (Addendum)
With hypomagnesemia Status post potassium chloride 10 mEq IV, 2 doses and potassium chloride 40 mEq PO per ED Will treat magnesium and recheck potassium level, timed for 1500 hrs. on day of admission

## 2023-10-31 NOTE — Progress Notes (Addendum)
[facility] Progress Note  Received a message from nursing staff that patient developed projectile vomiting after receiving Ativan p.o.  I presented to bedside.  Patient vitals were respiration rate elevated at 22, sinus tachycardia, blood pressure 167/111.  Patient was tremulous and diaphoretic.  Verbal order to nursing to administer Ativan 2 mg IV one-time dose.  Alcohol withdrawal -Continue CIWA precaution -One-time dose of Ativan 2 mg IV once -Discontinued p.o. Ativan as needed medication -Continue close monitoring  Hypophosphatemia-one-time dose of potassium sodium phosphate, 1 packet ordered - Potassium phosphate 15 mmol and dextrose 5%, one-time dose ordered Recheck phosphorus in the a.m.  Dr. Resa Miner Ativan 2 mg IV dosing: I reevaluated patient at bedside.  Patient had improved sinus tachycardia and improved diaphoresis.  Patient was able to tell me his name and states that he she is very tired.                     CRITICAL CARE Performed by: Dr. Sedalia Muta   Total critical care time: 31 minutes  Critical care time was exclusive of separately billable procedures and treating other patients.  Critical care was necessary to treat or prevent imminent or life-threatening deterioration.  Critical care was time spent personally by me on the following activities: development of treatment plan with patient as well as nursing, discussions with consultants, evaluation of patient's response to treatment, examination of patient, obtaining history from patient or surrogate, ordering and performing treatments and interventions, ordering and review of laboratory studies, ordering and review of radiographic studies, pulse oximetry and re-evaluation of patient's condition.

## 2023-10-31 NOTE — H&P (Addendum)
History and Physical   Melvin Knight NWG:[id number] DOB: [birth year] DOA: 10/31/2023  PCP: Margarita Mail, DO  Patient coming from: Home via EMS  I have personally briefly reviewed patient's old medical records in [facility] EMR.  Chief Concern: Alcohol withdrawal  HPI: Mr. Melvin Knight is a 42 year old male with history of alcohol abuse and dependence, alcohol withdrawal, hypertension, depression, anxiety, who presents emergency department for chief concerns of alcohol withdrawals.  Vitals in the ED showed temperature of 98.2, respiration rate 22, heart rate 105, blood pressure 170/1 117, SpO2 100% room air.  Serum sodium is 140, potassium 2.9, chloride 97, bicarb 23, BUN of 20, serum creatinine 1.24, EGFR greater than 60, nonfasting blood glucose 148, WBC 5.7, hemoglobin 13.4, platelets of 110.  EtOH level was 46.  ED treatment: Valium 10 mg IV one-time dose, potassium chloride 10 mEq IV x 2 doses, potassium chloride 40 mEq p.o. one-time dose.  Patient was initiated on CIWA precaution per EDP.  CIWA precaution was continued on admission. ----------------------------------- At bedside, patient was able to tell me his name, age, current location, current calendar year.  He reports his last EtOH drink was Saturday, 10/29/2023.  He normally drinks 1/5 bottom shelf whiskey per day.  He reports he developed tremors, and profound weakness, prompting him to call EMS for further evaluation.  He denies focal pain, shortness of breath, fever, cough, chills, nausea at this time.  He is trying to detox from alcohol use.  He reports he has been to to rehab facilities already.  Social history: He reports he lives alone with a dog.  He smokes 1 pack of cigarettes per day.  He endorses daily whiskey drinking.  He denies recreational drug use.  He currently works as a Lawyer for [employer].  ROS: Constitutional: no weight change, no fever ENT/Mouth: no sore throat, no rhinorrhea Eyes: no eye  pain, no vision changes Cardiovascular: no chest pain, no dyspnea,  no edema, no palpitations Respiratory: no cough, no sputum, no wheezing Gastrointestinal: + nausea, no vomiting, no diarrhea, no constipation Genitourinary: no urinary incontinence, no dysuria, no hematuria Musculoskeletal: no arthralgias, no myalgias Skin: no skin lesions, no pruritus, Neuro: + weakness, no loss of consciousness, no syncope, + tremors Psych: no anxiety, no depression, no decrease appetite Heme/Lymph: no bruising, no bleeding  ED Course: Discussed with EDP, patient requiring hospitalization for chief concerns of alcohol withdrawal.  Assessment/Plan  Principal Problem:   Alcohol withdrawal (HCC) Active Problems:   Electrolyte depletion   Hypokalemia   Hypomagnesemia   Hypophosphatemia   Thrombocytopenia (HCC)   Transaminitis   Tobacco abuse   Anxiety and depression   Abnormal LFTs   Essential hypertension   Alcoholic liver disease (HCC)   Sinus tachycardia   Hepatic steatosis   Assessment and Plan:  * Alcohol withdrawal (HCC) Continue CIWA precaution Fall and aspiration precaution initiated Continue sodium chloride 1 L bolus on admission Treat electrolyte depletion as appropriate Check phosphorus, urgent on admission Keep patient n.p.o. pending bedside evaluation Admit to PCU, telemetry  Electrolyte depletion Secondary to chronic alcohol use, treat per above and recheck in the a.m. as appropriate  Thrombocytopenia (HCC) Patient's platelet range 84-232 in the last 2 months; presumed secondary to chronic alcohol use Currently at baseline Recheck CBC in the a.m.  Hypophosphatemia Check phosphorus level, urgent on admission; and we will replace as appropriate Pending collection at the time of this dictation.  Called lab, and lab confirmed that they will collect this now.  Hypomagnesemia Magnesium sulfate 2 g IV one-time dose ordered on admission Recheck magnesium level in  a.m.  Hypokalemia With hypomagnesemia Status post potassium chloride 10 mEq IV, 2 doses and potassium chloride 40 mEq PO per ED Will treat magnesium and recheck potassium level, timed for 1500 hrs. on day of admission  Transaminitis Secondary to chronic alcohol use complicated by hepatic steatosis  Tobacco abuse As needed nicotine patch ordered  Sinus tachycardia Suspect secondary to alcohol withdrawal, treat per above  Essential hypertension Hydralazine 5 g IV q6h prn for SBP > 175, 5 days ordered  Abnormal LFTs Continue outpatient follow-up with PCP and GI provider as appropriate Recommend discontinuation of alcohol use  Chart reviewed.   DVT prophylaxis: Enoxaparin Code Status: Full code Diet: Heart healthy Family Communication: No Disposition Plan: Pending clinical course, guarded prognosis Consults called: TOC Admission status: Inpatient, PCU, telemetry  Past Medical History:  Diagnosis Date   AKI (acute kidney injury) (HCC) 09/24/2022   Anxiety    Hypertension    Hypokalemia    Hypokalemia 09/24/2022   Hypomagnesemia 09/24/2022   History reviewed. No pertinent surgical history.  Social History:  reports that he has been smoking cigarettes. He has never used smokeless tobacco. He reports current alcohol use of about 6.0 standard drinks of alcohol per week. He reports that he does not currently use drugs.  No Known Allergies Family History  Problem Relation Age of Onset   Hypertension Father    Stroke Father    Cancer Sister    Family history: Family history reviewed and not pertinent  Prior to Admission medications   Medication Sig Start Date End Date Taking? Authorizing Provider  escitalopram (LEXAPRO) 20 MG tablet Take 1 tablet (20 mg total) by mouth daily. 09/01/23 08/31/24  Gillis Santa, MD  folic acid (FOLVITE) 1 MG tablet Take 1 tablet (1 mg total) by mouth daily. 09/26/22   Esaw Grandchild A, DO  hydrALAZINE (APRESOLINE) 50 MG tablet Take 1  tablet (50 mg total) by mouth 2 (two) times daily. If systolic BP greater than 150 mmHg. 10/06/23   Margarita Mail, DO  hydrocortisone 1 % ointment Apply 1 Application topically 2 (two) times daily. 09/06/23   Margarita Mail, DO  hydrOXYzine (ATARAX) 10 MG tablet Take 1 tablet (10 mg total) by mouth 3 (three) times daily as needed for anxiety. 03/17/23   Margarita Mail, DO  losartan (COZAAR) 50 MG tablet Take 1 tablet (50 mg total) by mouth daily. 09/02/23 09/01/24  Gillis Santa, MD  MAGNESIUM PO Take by mouth.    [provider]  Multiple Vitamins-Minerals (MULTIVITAMIN WITH MINERALS) tablet Take 1 tablet by mouth daily. 07/06/22   Margarita Mail, DO  POTASSIUM PO Take by mouth.    [provider]  vitamin B-12 (CYANOCOBALAMIN) 100 MCG tablet Take 100 mcg by mouth daily.    [provider]  Vitamin D, Ergocalciferol, (DRISDOL) 1.25 MG (50000 UNIT) CAPS capsule Take 1 capsule (50,000 Units total) by mouth every 7 (seven) days. 09/08/23 12/07/23  Gillis Santa, MD   Physical Exam: Vitals:   10/31/23 1103 10/31/23 1105 10/31/23 1122 10/31/23 1314  BP:  (!) 170/117  (!) 167/111  Pulse:  (!) 105  (!) 101  Resp:      Temp:   98.2 F (36.8 C)   TempSrc:   Oral   SpO2:      Weight: 88.5 kg     Height: 5\' 7"  (1.702 m)      Constitutional: appears age-appropriate,  weak, acutely uncomfortable Eyes: PERRL, lids and conjunctivae normal ENMT: Mucous membranes are moist. Posterior pharynx clear of any exudate or lesions. Age-appropriate dentition. Hearing appropriate Neck: normal, supple, no masses, no thyromegaly Respiratory: clear to auscultation bilaterally, no wheezing, no crackles. Normal respiratory effort. No accessory muscle use.  Cardiovascular: Regular rate and rhythm, no murmurs / rubs / gallops. No extremity edema. 2+ pedal pulses. No carotid bruits.  Abdomen: Obese abdomen, no tenderness, no masses palpated, no hepatosplenomegaly. Bowel sounds  positive.  Musculoskeletal: no clubbing / cyanosis. No joint deformity upper and lower extremities. Good ROM, no contractures, no atrophy. Normal muscle tone.  Bilateral tremors of the upper extremity.  Patient unable to extend arm due to profound weakness Skin: no rashes, lesions, ulcers. No induration Neurologic: Sensation intact. Strength 4/5 in all upper extremities.  Psychiatric: Normal judgment and insight. Alert and oriented x 3.  Depressed mood.   EKG: independently reviewed, showing sinus tachycardia with rate of 107, QTc 488  Chest x-ray on Admission: Not indicated at this time  No results found.  Labs on Admission: I have personally reviewed following labs CBC: Recent Labs  Lab 10/31/23 1108  WBC 5.7  HGB 13.4  HCT 36.3*  MCV 96.0  PLT 110*   Basic Metabolic Panel: Recent Labs  Lab 10/31/23 1108  NA 140  K 2.9*  CL 97*  CO2 23  GLUCOSE 148*  BUN 20  CREATININE 1.24  CALCIUM 9.3  MG 1.2*   GFR: Estimated Creatinine Clearance: 84.1 mL/min (by C-G formula based on SCr of 1.24 mg/dL).  Liver Function Tests: Recent Labs  Lab 10/31/23 1108  AST 87*  ALT 51*  ALKPHOS 115  BILITOT 1.5*  PROT 8.4*  ALBUMIN 4.6   Urine analysis:    Component Value Date/Time   COLORURINE YELLOW (A) 09/25/2023 0157   APPEARANCEUR CLEAR (A) 09/25/2023 0157   LABSPEC 1.015 09/25/2023 0157   PHURINE 7.0 09/25/2023 0157   GLUCOSEU 150 (A) 09/25/2023 0157   HGBUR NEGATIVE 09/25/2023 0157   BILIRUBINUR NEGATIVE 09/25/2023 0157   KETONESUR NEGATIVE 09/25/2023 0157   PROTEINUR NEGATIVE 09/25/2023 0157   NITRITE NEGATIVE 09/25/2023 0157   LEUKOCYTESUR NEGATIVE 09/25/2023 0157   This document was prepared using Dragon Voice Recognition software and may include unintentional dictation errors.  Dr. Sedalia Muta [facility]  If 7PM-7AM, please contact overnight-coverage provider If 7AM-7PM, please contact day attending provider [email or web address]  10/31/2023, 3:15 PM

## 2023-10-31 NOTE — ED Notes (Signed)
DO notified of CIWA 23. DO to bedside. Verbal order to this RN for 2mg  Ativan IVP

## 2023-10-31 NOTE — Assessment & Plan Note (Signed)
Magnesium sulfate 2 g IV one-time dose ordered on admission Recheck magnesium level in a.m.

## 2023-10-31 NOTE — Assessment & Plan Note (Signed)
Continue outpatient follow-up with PCP and GI provider as appropriate Recommend discontinuation of alcohol use

## 2023-11-01 ENCOUNTER — Encounter: Payer: Self-pay | Admitting: Internal Medicine

## 2023-11-01 DIAGNOSIS — F10939 Alcohol use, unspecified with withdrawal, unspecified: Secondary | ICD-10-CM | POA: Diagnosis not present

## 2023-11-01 LAB — URINE DRUG SCREEN, QUALITATIVE (ARMC ONLY)
Amphetamines, Ur Screen: NOT DETECTED
Barbiturates, Ur Screen: NOT DETECTED
Benzodiazepine, Ur Scrn: POSITIVE — AB
Cannabinoid 50 Ng, Ur ~~LOC~~: NOT DETECTED
Cocaine Metabolite,Ur ~~LOC~~: NOT DETECTED
MDMA (Ecstasy)Ur Screen: NOT DETECTED
Methadone Scn, Ur: NOT DETECTED
Opiate, Ur Screen: NOT DETECTED
Phencyclidine (PCP) Ur S: NOT DETECTED
Tricyclic, Ur Screen: NOT DETECTED

## 2023-11-01 LAB — CBC
HCT: 33.2 % — ABNORMAL LOW (ref 39.0–52.0)
Hemoglobin: 12.2 g/dL — ABNORMAL LOW (ref 13.0–17.0)
MCH: 35.7 pg — ABNORMAL HIGH (ref 26.0–34.0)
MCHC: 36.7 g/dL — ABNORMAL HIGH (ref 30.0–36.0)
MCV: 97.1 fL (ref 80.0–100.0)
Platelets: 80 10*3/uL — ABNORMAL LOW (ref 150–400)
RBC: 3.42 MIL/uL — ABNORMAL LOW (ref 4.22–5.81)
RDW: 12.2 % (ref 11.5–15.5)
WBC: 3.9 10*3/uL — ABNORMAL LOW (ref 4.0–10.5)
nRBC: 0 % (ref 0.0–0.2)

## 2023-11-01 LAB — MAGNESIUM: Magnesium: 1.6 mg/dL — ABNORMAL LOW (ref 1.7–2.4)

## 2023-11-01 LAB — BASIC METABOLIC PANEL
Anion gap: 11 (ref 5–15)
BUN: 15 mg/dL (ref 6–20)
CO2: 29 mmol/L (ref 22–32)
Calcium: 8.7 mg/dL — ABNORMAL LOW (ref 8.9–10.3)
Chloride: 97 mmol/L — ABNORMAL LOW (ref 98–111)
Creatinine, Ser: 1.21 mg/dL (ref 0.61–1.24)
GFR, Estimated: >60 mL/min (ref >60)
Glucose, Bld: 102 mg/dL — ABNORMAL HIGH (ref 70–99)
Potassium: 3.1 mmol/L — ABNORMAL LOW (ref 3.5–5.1)
Sodium: 137 mmol/L (ref 135–145)

## 2023-11-01 LAB — PHOSPHORUS: Phosphorus: 2.6 mg/dL (ref 2.5–4.6)

## 2023-11-01 MED ORDER — HYDRALAZINE HCL 50 MG PO TABS
50.0000 mg | ORAL_TABLET | Freq: Two times a day (BID) | ORAL | Status: DC
  Administered 2023-11-01 – 2023-11-03 (×4): 50 mg via ORAL
  Filled 2023-11-01 (×4): qty 1

## 2023-11-01 MED ORDER — METOPROLOL SUCCINATE ER 25 MG PO TB24
12.5000 mg | ORAL_TABLET | Freq: Every morning | ORAL | Status: DC
  Administered 2023-11-02 – 2023-11-03 (×2): 12.5 mg via ORAL
  Filled 2023-11-01 (×2): qty 1

## 2023-11-01 MED ORDER — ORAL CARE MOUTH RINSE: 15.0000 mL | OROMUCOSAL | Status: DC | PRN

## 2023-11-01 MED ORDER — POTASSIUM CHLORIDE CRYS ER 20 MEQ PO TBCR
40.0000 meq | EXTENDED_RELEASE_TABLET | Freq: Once | ORAL | Status: AC
  Administered 2023-11-01: 40 meq via ORAL
  Filled 2023-11-01: qty 2

## 2023-11-01 MED ORDER — LOSARTAN POTASSIUM 50 MG PO TABS
50.0000 mg | ORAL_TABLET | Freq: Every day | ORAL | Status: DC
  Administered 2023-11-01 – 2023-11-03 (×3): 50 mg via ORAL
  Filled 2023-11-01 (×3): qty 1

## 2023-11-01 MED ORDER — MAGNESIUM SULFATE 2 GM/50ML IV SOLN
2.0000 g | Freq: Once | INTRAVENOUS | Status: AC
  Administered 2023-11-01: 2 g via INTRAVENOUS
  Filled 2023-11-01: qty 50

## 2023-11-01 NOTE — Discharge Instructions (Signed)
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
## 2023-11-01 NOTE — ED Notes (Signed)
Patient quiet and sleeping.

## 2023-11-01 NOTE — Progress Notes (Signed)
  PROGRESS NOTE    Melvin Knight  LKG:[id number] DOB: [birth year] DOA: 10/31/2023 PCP: Margarita Mail, DO  [id number]  LOS: 1 day   Brief hospital course:   Assessment & Plan: Melvin Knight is a 42 year old male with history of alcohol abuse and dependence, alcohol withdrawal, hypertension, depression, anxiety, who presents emergency department for chief concerns of alcohol withdrawals.    * Alcohol withdrawal (HCC) Alcohol abuse Continue CIWA with ativan PRN Cont folic acid and thiamine  Hypokalemia Hypomag Hypophos Secondary to chronic alcohol use --monitor and supplement PRN  Thrombocytopenia (HCC) Patient's platelet range 84-232 in the last 2 months; presumed secondary to chronic alcohol use --monitor  Transaminitis, mild Secondary to chronic alcohol use complicated by hepatic steatosis  Tobacco abuse As needed nicotine patch ordered  Essential hypertension --resume home Toprol, hydralazine and losartan   DVT prophylaxis: Lovenox SQ Code Status: Full code  Family Communication:  Level of care: Med-Surg Dispo:   The patient is from: home Anticipated d/c is to: home Anticipated d/c date is: 1-2 days Patient currently is not medically ready to d/c due to: alcohol withdrawal, severe electrolyte def   Subjective and Interval History:  Pt reported feeling tired and sleepy.     Objective: Vitals:   11/01/23 0820 11/01/23 0943 11/01/23 1200 11/01/23 1636  BP: (!) 162/113 (!) 154/111 (!) 159/112 (!) 166/93  Pulse: 90 98 80 63  Resp: 16  18 17   Temp: 98.6 F (37 C)  99 F (37.2 C)   TempSrc: Oral  Oral   SpO2: 95%  95% 99%  Weight:      Height:        Intake/Output Summary (Last 24 hours) at 11/01/2023 1945 Last data filed at 11/01/2023 1605 Gross per 24 hour  Intake 41.51 ml  Output --  Net 41.51 ml   Filed Weights   10/31/23 1103  Weight: 88.5 kg    Examination:   Constitutional: NAD, AAOx3 HEENT: conjunctivae and lids normal, EOMI CV:  No cyanosis.   RESP: normal respiratory effort, on RA Neuro: II - XII grossly intact.   Psych: Normal mood and affect.  Appropriate judgement and reason   Data Reviewed: I have personally reviewed labs and imaging studies  Time spent: 50 minutes  Darlin Priestly, MD [facility] If 7PM-7AM, please contact night-coverage 11/01/2023, 7:45 PM

## 2023-11-02 DIAGNOSIS — F10939 Alcohol use, unspecified with withdrawal, unspecified: Secondary | ICD-10-CM | POA: Diagnosis not present

## 2023-11-02 LAB — BASIC METABOLIC PANEL
Anion gap: 10 (ref 5–15)
BUN: 11 mg/dL (ref 6–20)
CO2: 26 mmol/L (ref 22–32)
Calcium: 8.9 mg/dL (ref 8.9–10.3)
Chloride: 98 mmol/L (ref 98–111)
Creatinine, Ser: 1.02 mg/dL (ref 0.61–1.24)
GFR, Estimated: >60 mL/min (ref >60)
Glucose, Bld: 92 mg/dL (ref 70–99)
Potassium: 3 mmol/L — ABNORMAL LOW (ref 3.5–5.1)
Sodium: 134 mmol/L — ABNORMAL LOW (ref 135–145)

## 2023-11-02 LAB — CBC
HCT: 33.1 % — ABNORMAL LOW (ref 39.0–52.0)
Hemoglobin: 12.2 g/dL — ABNORMAL LOW (ref 13.0–17.0)
MCH: 35.5 pg — ABNORMAL HIGH (ref 26.0–34.0)
MCHC: 36.9 g/dL — ABNORMAL HIGH (ref 30.0–36.0)
MCV: 96.2 fL (ref 80.0–100.0)
Platelets: 76 10*3/uL — ABNORMAL LOW (ref 150–400)
RBC: 3.44 MIL/uL — ABNORMAL LOW (ref 4.22–5.81)
RDW: 11.8 % (ref 11.5–15.5)
WBC: 3.5 10*3/uL — ABNORMAL LOW (ref 4.0–10.5)
nRBC: 0 % (ref 0.0–0.2)

## 2023-11-02 LAB — PHOSPHORUS: Phosphorus: 1.9 mg/dL — ABNORMAL LOW (ref 2.5–4.6)

## 2023-11-02 LAB — MAGNESIUM: Magnesium: 1.3 mg/dL — ABNORMAL LOW (ref 1.7–2.4)

## 2023-11-02 MED ORDER — MAGNESIUM SULFATE 4 GM/100ML IV SOLN
4.0000 g | Freq: Once | INTRAVENOUS | Status: AC
  Administered 2023-11-02: 4 g via INTRAVENOUS
  Filled 2023-11-02: qty 100

## 2023-11-02 MED ORDER — POTASSIUM & SODIUM PHOSPHATES 280-160-250 MG PO PACK
2.0000 | PACK | Freq: Once | ORAL | Status: AC
  Administered 2023-11-02: 2 via ORAL
  Filled 2023-11-02: qty 2

## 2023-11-02 MED ORDER — POTASSIUM CHLORIDE CRYS ER 20 MEQ PO TBCR
40.0000 meq | EXTENDED_RELEASE_TABLET | Freq: Once | ORAL | Status: AC
  Administered 2023-11-02: 40 meq via ORAL
  Filled 2023-11-02: qty 2

## 2023-11-02 NOTE — Progress Notes (Signed)
PROGRESS NOTE    Melvin Knight  WRU:[id number] DOB: 04/24/1981 DOA: 10/31/2023 PCP: Margarita Mail, DO   Assessment & Plan:   Principal Problem:   Alcohol withdrawal (HCC) Active Problems:   Electrolyte depletion   Hypokalemia   Hypomagnesemia   Hypophosphatemia   Thrombocytopenia (HCC)   Transaminitis   Tobacco abuse   Anxiety and depression   Abnormal LFTs   Essential hypertension   Alcoholic liver disease (HCC)   Sinus tachycardia   Hepatic steatosis  Assessment and Plan: Alcohol withdrawal: continue on folate, thiamine. Continue CIWA protocol. Wants to quit drinking    Hypokalemia: potassium given  Hypomagnesemia: mg sulfate given  Hypophosphatemia: potass phos given  Thrombocytopenia: likely secondary to alcohol abuse. Will continue to monitor    Transaminitis: likely secondary to alcohol abuse  Tobacco abuse: nicotine patch to prevent w/drawal. Smoking cessation counseling x 5 mins    HTN: continue on metoprolol, losartan, hydralazine       DVT prophylaxis: lovenox Code Status: full  Family Communication: discussed pt's care w/ pt's family at bedside and answered their questions Disposition Plan: likely d/c back home   Level of care: Med-Surg Status is: Inpatient Remains inpatient appropriate because: likely d/c back home    Consultants:    Procedures:   Antimicrobials:  Subjective: Pt c/o malaise   Objective: Vitals:   11/01/23 2000 11/01/23 2230 11/02/23 0400 11/02/23 0802  BP: (!) 165/128 (!) 161/128 (!) 164/117 (!) 151/108  Pulse: 76 75 73 83  Resp: 18  17 19   Temp: 99 F (37.2 C)  98.6 F (37 C) 99.2 F (37.3 C)  TempSrc: Oral  Oral   SpO2: 98%  97% 95%  Weight:      Height:        Intake/Output Summary (Last 24 hours) at 11/02/2023 0816 Last data filed at 11/01/2023 1605 Gross per 24 hour  Intake 41.51 ml  Output --  Net 41.51 ml   Filed Weights   10/31/23 1103  Weight: 88.5 kg    Examination:  General  exam: Appears calm and comfortable  Respiratory system: Clear to auscultation. Respiratory effort normal. Cardiovascular system: S1 & S2 +. No  rubs, gallops or clicks Gastrointestinal system: Abdomen is nondistended, soft and nontender. Normal bowel sounds heard. Central nervous system: Alert and oriented. Moves all extremities  Psychiatry: Judgement and insight appear normal. Mood & affect appropriate.     Data Reviewed: I have personally reviewed following labs and imaging studies  CBC: Recent Labs  Lab 10/31/23 1108 11/01/23 0434 11/02/23 0417  WBC 5.7 3.9* 3.5*  HGB 13.4 12.2* 12.2*  HCT 36.3* 33.2* 33.1*  MCV 96.0 97.1 96.2  PLT 110* 80* 76*   Basic Metabolic Panel: Recent Labs  Lab 10/31/23 1108 10/31/23 1830 11/01/23 0434 11/02/23 0417  NA 140  --  137 134*  K 2.9* 3.3* 3.1* 3.0*  CL 97*  --  97* 98  CO2 23  --  29 26  GLUCOSE 148*  --  102* 92  BUN 20  --  15 11  CREATININE 1.24  --  1.21 1.02  CALCIUM 9.3  --  8.7* 8.9  MG 1.2*  --  1.6* 1.3*  PHOS 1.2*  --  2.6 1.9*   GFR: Estimated Creatinine Clearance: 102.3 mL/min (by C-G formula based on SCr of 1.02 mg/dL). Liver Function Tests: Recent Labs  Lab 10/31/23 1108  AST 87*  ALT 51*  ALKPHOS 115  BILITOT 1.5*  PROT 8.4*  ALBUMIN  4.6   No results for input(s): "LIPASE", "AMYLASE" in the last 168 hours. No results for input(s): "AMMONIA" in the last 168 hours. Coagulation Profile: No results for input(s): "INR", "PROTIME" in the last 168 hours. Cardiac Enzymes: No results for input(s): "CKTOTAL", "CKMB", "CKMBINDEX", "TROPONINI" in the last 168 hours. BNP (last 3 results) No results for input(s): "PROBNP" in the last 8760 hours. HbA1C: No results for input(s): "HGBA1C" in the last 72 hours. CBG: No results for input(s): "GLUCAP" in the last 168 hours. Lipid Profile: No results for input(s): "CHOL", "HDL", "LDLCALC", "TRIG", "CHOLHDL", "LDLDIRECT" in the last 72 hours. Thyroid Function  Tests: No results for input(s): "TSH", "T4TOTAL", "FREET4", "T3FREE", "THYROIDAB" in the last 72 hours. Anemia Panel: No results for input(s): "VITAMINB12", "FOLATE", "FERRITIN", "TIBC", "IRON", "RETICCTPCT" in the last 72 hours. Sepsis Labs: No results for input(s): "PROCALCITON", "LATICACIDVEN" in the last 168 hours.  No results found for this or any previous visit (from the past 240 hour(s)).       Radiology Studies: No results found.      Scheduled Meds:  enoxaparin (LOVENOX) injection  40 mg Subcutaneous Q24H   folic acid  1 mg Oral Daily   hydrALAZINE  50 mg Oral BID   losartan  50 mg Oral Daily   metoprolol succinate  12.5 mg Oral q morning   multivitamin with minerals  1 tablet Oral Daily   thiamine  100 mg Oral Daily   Or   thiamine  100 mg Intravenous Daily   Continuous Infusions:   LOS: 2 days      Charise Killian, MD [facility] Pager [phone]  If 7PM-7AM, please contact night-coverage [email or web address] 11/02/2023, 8:16 AM
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
## 2023-11-02 NOTE — Progress Notes (Signed)
Patient states he is missing his cell phone. Looked again through personal belongings with permission in front of patient; no cell phone with belongings.  Called ED charge RN Herbert Seta) who stated she did not see him with it, and his primary nurse said they did not see it when he came in with EMS nor throughout the day.

## 2023-11-02 NOTE — Plan of Care (Signed)
  Problem: Education: Goal: Knowledge of General Education information will improve Description: Including pain rating scale, medication(s)/side effects and non-pharmacologic comfort measures Outcome: Progressing   Problem: Health Behavior/Discharge Planning: Goal: Ability to manage health-related needs will improve Outcome: Progressing   Problem: Clinical Measurements: Goal: Ability to maintain clinical measurements within normal limits will improve Outcome: Progressing Goal: Will remain free from infection Outcome: Progressing Goal: Diagnostic test results will improve Outcome: Progressing Goal: Respiratory complications will improve Outcome: Progressing Goal: Cardiovascular complication will be avoided Outcome: Progressing   Problem: Activity: Goal: Risk for activity intolerance will decrease Outcome: Progressing   Problem: Nutrition: Goal: Adequate nutrition will be maintained Outcome: Progressing   Problem: Coping: Goal: Level of anxiety will decrease Outcome: Progressing   Problem: Elimination: Goal: Will not experience complications related to bowel motility Outcome: Progressing Goal: Will not experience complications related to urinary retention Outcome: Progressing   Problem: Pain Management: Goal: General experience of comfort will improve Outcome: Progressing   Problem: Safety: Goal: Ability to remain free from injury will improve Outcome: Progressing   Problem: Skin Integrity: Goal: Risk for impaired skin integrity will decrease Outcome: Progressing   Problem: Education: Goal: Knowledge of disease or condition will improve Outcome: Progressing Goal: Understanding of discharge needs will improve Outcome: Progressing   Problem: Health Behavior/Discharge Planning: Goal: Ability to identify changes in lifestyle to reduce recurrence of condition will improve Outcome: Progressing Goal: Identification of resources available to assist in meeting health  care needs will improve Outcome: Progressing   Problem: Physical Regulation: Goal: Complications related to the disease process, condition or treatment will be avoided or minimized Outcome: Progressing   Problem: Safety: Goal: Ability to remain free from injury will improve Outcome: Progressing

## 2023-11-03 DIAGNOSIS — F10939 Alcohol use, unspecified with withdrawal, unspecified: Secondary | ICD-10-CM | POA: Diagnosis not present

## 2023-11-03 LAB — COMPREHENSIVE METABOLIC PANEL
ALT: 59 U/L — ABNORMAL HIGH (ref 0–44)
AST: 105 U/L — ABNORMAL HIGH (ref 15–41)
Albumin: 3.7 g/dL (ref 3.5–5.0)
Alkaline Phosphatase: 110 U/L (ref 38–126)
Anion gap: 8 (ref 5–15)
BUN: 12 mg/dL (ref 6–20)
CO2: 27 mmol/L (ref 22–32)
Calcium: 9 mg/dL (ref 8.9–10.3)
Chloride: 100 mmol/L (ref 98–111)
Creatinine, Ser: 0.8 mg/dL (ref 0.61–1.24)
GFR, Estimated: >60 mL/min (ref >60)
Glucose, Bld: 132 mg/dL — ABNORMAL HIGH (ref 70–99)
Potassium: 3.1 mmol/L — ABNORMAL LOW (ref 3.5–5.1)
Sodium: 135 mmol/L (ref 135–145)
Total Bilirubin: 1.2 mg/dL — ABNORMAL HIGH (ref <1.2)
Total Protein: 7.1 g/dL (ref 6.5–8.1)

## 2023-11-03 LAB — CBC
HCT: 33.8 % — ABNORMAL LOW (ref 39.0–52.0)
Hemoglobin: 12.5 g/dL — ABNORMAL LOW (ref 13.0–17.0)
MCH: 35.3 pg — ABNORMAL HIGH (ref 26.0–34.0)
MCHC: 37 g/dL — ABNORMAL HIGH (ref 30.0–36.0)
MCV: 95.5 fL (ref 80.0–100.0)
Platelets: 83 10*3/uL — ABNORMAL LOW (ref 150–400)
RBC: 3.54 MIL/uL — ABNORMAL LOW (ref 4.22–5.81)
RDW: 11.8 % (ref 11.5–15.5)
WBC: 3.6 10*3/uL — ABNORMAL LOW (ref 4.0–10.5)
nRBC: 0 % (ref 0.0–0.2)

## 2023-11-03 LAB — MAGNESIUM: Magnesium: 1.7 mg/dL (ref 1.7–2.4)

## 2023-11-03 LAB — PHOSPHORUS: Phosphorus: 3.2 mg/dL (ref 2.5–4.6)

## 2023-11-03 MED ORDER — POTASSIUM CHLORIDE CRYS ER 20 MEQ PO TBCR
40.0000 meq | EXTENDED_RELEASE_TABLET | Freq: Two times a day (BID) | ORAL | Status: DC
  Administered 2023-11-03: 40 meq via ORAL
  Filled 2023-11-03: qty 2

## 2023-11-03 MED ORDER — MAGNESIUM SULFATE 2 GM/50ML IV SOLN
2.0000 g | Freq: Once | INTRAVENOUS | Status: AC
  Administered 2023-11-03: 2 g via INTRAVENOUS
  Filled 2023-11-03: qty 50

## 2023-11-03 MED ORDER — CHLORDIAZEPOXIDE HCL 25 MG PO CAPS: 25.0000 mg | ORAL_CAPSULE | Freq: Three times a day (TID) | ORAL | 0 refills | Status: AC | PRN

## 2023-11-03 NOTE — Discharge Summary (Signed)
Physician Discharge Summary  Melvin Knight UJW:[id number] DOB: [birth year] DOA: 10/31/2023  PCP: Margarita Mail, DO  Admit date: 10/31/2023 Discharge date: 11/03/2023  Admitted From: home  Disposition:  home   Recommendations for Outpatient Follow-up:  Follow up with PCP in 1-2 weeks   Home Health: no  Equipment/Devices:  Discharge Condition: stable CODE STATUS: full  Diet recommendation: Heart Healthy   Brief/Interim Summary: HPI was taken from Dr. Sedalia Muta: Mr. Melvin Knight is a 42 year old male with history of alcohol abuse and dependence, alcohol withdrawal, hypertension, depression, anxiety, who presents emergency department for chief concerns of alcohol withdrawals.   Vitals in the ED showed temperature of 98.2, respiration rate 22, heart rate 105, blood pressure 170/1 117, SpO2 100% room air.   Serum sodium is 140, potassium 2.9, chloride 97, bicarb 23, BUN of 20, serum creatinine 1.24, EGFR greater than 60, nonfasting blood glucose 148, WBC 5.7, hemoglobin 13.4, platelets of 110.  EtOH level was 46.   ED treatment: Valium 10 mg IV one-time dose, potassium chloride 10 mEq IV x 2 doses, potassium chloride 40 mEq p.o. one-time dose.  Patient was initiated on CIWA precaution per EDP.   CIWA precaution was continued on admission. ----------------------------------- At bedside, patient was able to tell me his name, age, current location, current calendar year.   He reports his last EtOH drink was Saturday, 10/29/2023.  He normally drinks 1/5 bottom shelf whiskey per day.  He reports he developed tremors, and profound weakness, prompting him to call EMS for further evaluation.  He denies focal pain, shortness of breath, fever, cough, chills, nausea at this time.   He is trying to detox from alcohol use.  He reports he has been to to rehab facilities already.  Discharge Diagnoses:  Principal Problem:   Alcohol withdrawal (HCC) Active Problems:   Electrolyte depletion    Hypokalemia   Hypomagnesemia   Hypophosphatemia   Thrombocytopenia (HCC)   Transaminitis   Tobacco abuse   Anxiety and depression   Abnormal LFTs   Essential hypertension   Alcoholic liver disease (HCC)   Sinus tachycardia   Hepatic steatosis  Alcohol withdrawal: continue on folate, thiamine. Continue CIWA protocol. Wants to quit drinking. D/c home w/ librium    Hypokalemia: potassium given  Hypomagnesemia: mg sulfate given  Hypophosphatemia: WNL   Thrombocytopenia: likely secondary to alcohol abuse. Will continue to monitor    Transaminitis: likely secondary to alcohol abuse  Tobacco abuse: nicotine patch to prevent w/drawal. Smoking cessation counseling x 5 mins    HTN: continue on metoprolol, losartan, hydralazine   Discharge Instructions  Discharge Instructions     Diet - low sodium heart healthy   Complete by: As directed    Discharge instructions   Complete by: As directed    F/u w/ PCP in 1-2 weeks.   Increase activity slowly   Complete by: As directed       Allergies as of 11/03/2023   No Known Allergies      Medication List     TAKE these medications    chlordiazePOXIDE 25 MG capsule Commonly known as: LIBRIUM Take 1 capsule (25 mg total) by mouth 3 (three) times daily as needed for up to 21 days for withdrawal.   escitalopram 20 MG tablet Commonly known as: Lexapro Take 1 tablet (20 mg total) by mouth daily.   FLUoxetine 40 MG capsule Commonly known as: PROZAC Take 40 mg by mouth every morning.   folic acid 1 MG tablet Commonly known as:  FOLVITE Take 1 tablet (1 mg total) by mouth daily.   gabapentin 300 MG capsule Commonly known as: NEURONTIN Take 300 mg by mouth 3 (three) times daily.   hydrALAZINE 50 MG tablet Commonly known as: APRESOLINE Take 1 tablet (50 mg total) by mouth 2 (two) times daily. If systolic BP greater than 150 mmHg.   hydrocortisone 1 % ointment Apply 1 Application topically 2 (two) times daily.    hydrOXYzine 10 MG tablet Commonly known as: ATARAX Take 1 tablet (10 mg total) by mouth 3 (three) times daily as needed for anxiety.   losartan 50 MG tablet Commonly known as: COZAAR Take 1 tablet (50 mg total) by mouth daily.   MAGNESIUM PO Take by mouth.   melatonin 3 MG Tabs tablet Take 6 mg by mouth at bedtime as needed.   metoprolol succinate 25 MG 24 hr tablet Commonly known as: TOPROL-XL Take 12.5 mg by mouth every morning.   mometasone 0.1 % cream Commonly known as: ELOCON Apply 1 Application topically in the morning and at bedtime.   Mucinex 600 MG 12 hr tablet Generic drug: guaiFENesin Take 1,200 mg by mouth 2 (two) times daily as needed.   multivitamin with minerals tablet Take 1 tablet by mouth daily.   Potassium Chloride ER 20 MEQ Tbcr Take 1 tablet by mouth every morning.   POTASSIUM PO Take by mouth.   thiamine 100 MG tablet Commonly known as: VITAMIN B1 Take 100 mg by mouth every morning.   traZODone 50 MG tablet Commonly known as: DESYREL Take 50-100 mg by mouth at bedtime as needed.   vitamin B-12 100 MCG tablet Commonly known as: CYANOCOBALAMIN Take 100 mcg by mouth daily.   Vitamin D (Ergocalciferol) 1.25 MG (50000 UNIT) Caps capsule Commonly known as: DRISDOL Take 1 capsule (50,000 Units total) by mouth every 7 (seven) days.        No Known Allergies  Consultations:    Procedures/Studies: No results found. (Echo, Carotid, EGD, Colonoscopy, ERCP)    Subjective: Pt c/o malaise    Discharge Exam: Vitals:   11/03/23 1222 11/03/23 1344  BP: (!) 150/104 (!) 151/98  Pulse: 79   Resp: 17   Temp: 98.8 F (37.1 C)   SpO2: 98%    Vitals:   11/03/23 0333 11/03/23 0843 11/03/23 1222 11/03/23 1344  BP: (!) 163/106 (!) 141/105 (!) 150/104 (!) 151/98  Pulse: 72 79 79   Resp: 16 17 17    Temp: 98.5 F (36.9 C) 97.9 F (36.6 C) 98.8 F (37.1 C)   TempSrc:  Oral    SpO2: 99% 97% 98%   Weight:      Height:        General:  Pt is alert, awake, not in acute distress Cardiovascular:S1/S2 +, no rubs, no gallops Respiratory: CTA bilaterally, no wheezing, no rhonchi Abdominal: Soft, NT, obese, bowel sounds + Extremities: no edema, no cyanosis    The results of significant diagnostics from this hospitalization (including imaging, microbiology, ancillary and laboratory) are listed below for reference.     Microbiology: No results found for this or any previous visit (from the past 240 hours).   Labs: BNP (last 3 results) No results for input(s): "BNP" in the last 8760 hours. Basic Metabolic Panel: Recent Labs  Lab 10/31/23 1108 10/31/23 1830 11/01/23 0434 11/02/23 0417 11/03/23 0452  NA 140  --  137 134* 135  K 2.9* 3.3* 3.1* 3.0* 3.1*  CL 97*  --  97* 98 100  CO2  23  --  29 26 27   GLUCOSE 148*  --  102* 92 132*  BUN 20  --  15 11 12   CREATININE 1.24  --  1.21 1.02 0.80  CALCIUM 9.3  --  8.7* 8.9 9.0  MG 1.2*  --  1.6* 1.3* 1.7  PHOS 1.2*  --  2.6 1.9* 3.2   Liver Function Tests: Recent Labs  Lab 10/31/23 1108 11/03/23 0452  AST 87* 105*  ALT 51* 59*  ALKPHOS 115 110  BILITOT 1.5* 1.2*  PROT 8.4* 7.1  ALBUMIN 4.6 3.7   No results for input(s): "LIPASE", "AMYLASE" in the last 168 hours. No results for input(s): "AMMONIA" in the last 168 hours. CBC: Recent Labs  Lab 10/31/23 1108 11/01/23 0434 11/02/23 0417 11/03/23 0452  WBC 5.7 3.9* 3.5* 3.6*  HGB 13.4 12.2* 12.2* 12.5*  HCT 36.3* 33.2* 33.1* 33.8*  MCV 96.0 97.1 96.2 95.5  PLT 110* 80* 76* 83*   Cardiac Enzymes: No results for input(s): "CKTOTAL", "CKMB", "CKMBINDEX", "TROPONINI" in the last 168 hours. BNP: Invalid input(s): "POCBNP" CBG: No results for input(s): "GLUCAP" in the last 168 hours. D-Dimer No results for input(s): "DDIMER" in the last 72 hours. Hgb A1c No results for input(s): "HGBA1C" in the last 72 hours. Lipid Profile No results for input(s): "CHOL", "HDL", "LDLCALC", "TRIG", "CHOLHDL", "LDLDIRECT" in  the last 72 hours. Thyroid function studies No results for input(s): "TSH", "T4TOTAL", "T3FREE", "THYROIDAB" in the last 72 hours.  Invalid input(s): "FREET3" Anemia work up No results for input(s): "VITAMINB12", "FOLATE", "FERRITIN", "TIBC", "IRON", "RETICCTPCT" in the last 72 hours. Urinalysis    Component Value Date/Time   COLORURINE YELLOW (A) 09/25/2023 0157   APPEARANCEUR CLEAR (A) 09/25/2023 0157   LABSPEC 1.015 09/25/2023 0157   PHURINE 7.0 09/25/2023 0157   GLUCOSEU 150 (A) 09/25/2023 0157   HGBUR NEGATIVE 09/25/2023 0157   BILIRUBINUR NEGATIVE 09/25/2023 0157   KETONESUR NEGATIVE 09/25/2023 0157   PROTEINUR NEGATIVE 09/25/2023 0157   NITRITE NEGATIVE 09/25/2023 0157   LEUKOCYTESUR NEGATIVE 09/25/2023 0157   Sepsis Labs Recent Labs  Lab 10/31/23 1108 11/01/23 0434 11/02/23 0417 11/03/23 0452  WBC 5.7 3.9* 3.5* 3.6*   Microbiology No results found for this or any previous visit (from the past 240 hours).   Time coordinating discharge: Over 30 minutes  SIGNED:   Charise Killian, MD  [facility] 11/03/2023, 2:52 PM Pager   If 7PM-7AM, please contact night-coverage [email or web address]

## 2023-11-03 NOTE — TOC Progression Note (Signed)
Transition of Care [facility]) - Progression Note    Patient Details  Name: Melvin Knight MRN: [id number] Date of Birth: 06-12-1981  [facility] CM/SW Contact  Truddie Hidden, RN Phone Number: 11/03/2023, 10:59 AM  Clinical Narrative:    TOC continuing to follow patient's progress throughout discharge planning.        Expected Discharge Plan and Services                                               Social Determinants of Health (SDOH) Interventions SDOH Screenings   Food Insecurity: No Food Insecurity (11/01/2023)  Housing: Low Risk  (11/01/2023)  Transportation Needs: No Transportation Needs (11/01/2023)  Utilities: Not At Risk (11/01/2023)  Alcohol Screen: Low Risk  (03/17/2023)  Depression (PHQ2-9): Low Risk  (10/06/2023)  Financial Resource Strain: Low Risk  (03/16/2023)  Physical Activity: Sufficiently Active (03/16/2023)  Social Connections: Unknown (03/16/2023)  Stress: No Stress Concern Present (03/16/2023)  Tobacco Use: High Risk (11/01/2023)    Readmission Risk Interventions     No data to display

## 2023-11-03 NOTE — Progress Notes (Signed)
IV removed, hemostasis achieved, tip intact. Pt tol well. Provided d/c education, pt verbalizes understanding to pick up scripts, go to F/U appointment and stop drinking. Pt provided with taxi voucher and [place] [facility] called for pickup. Pt denies further questions at this time.

## 2023-11-03 NOTE — Plan of Care (Signed)
  Problem: Activity: Goal: Risk for activity intolerance will decrease Outcome: Adequate for Discharge   Problem: Nutrition: Goal: Adequate nutrition will be maintained Outcome: Adequate for Discharge

## 2023-11-04 ENCOUNTER — Telehealth: Payer: Self-pay

## 2023-11-04 NOTE — Transitions of Care (Post Inpatient/ED Visit) (Unsigned)
   11/04/2023  Name: Melvin Knight MRN: [id number] DOB: [birth year]  Today's TOC FU Call Status: Today's TOC FU Call Status:: Unsuccessful Call (1st Attempt) Unsuccessful Call (1st Attempt) Date: 11/04/23  Attempted to reach the patient regarding the most recent Inpatient/ED visit.  Follow Up Plan: Additional outreach attempts will be made to reach the patient to complete the Transitions of Care (Post Inpatient/ED visit) call.   Signature  Karena Addison, LPN [facility] Nurse Health Advisor Direct Dial [phone]

## 2023-11-08 NOTE — Transitions of Care (Post Inpatient/ED Visit) (Signed)
   11/08/2023  Name: Melvin Knight MRN: [id number] DOB: May 26, 1981  Today's TOC FU Call Status: Today's TOC FU Call Status:: Unsuccessful Call (2nd Attempt) Unsuccessful Call (1st Attempt) Date: 11/04/23 Unsuccessful Call (2nd Attempt) Date: 11/08/23  Attempted to reach the patient regarding the most recent Inpatient/ED visit.  Follow Up Plan: Additional outreach attempts will be made to reach the patient to complete the Transitions of Care (Post Inpatient/ED visit) call.   Signature Karena Addison, LPN [facility] Nurse Health Advisor Direct Dial [phone]

## 2023-11-08 NOTE — Transitions of Care (Post Inpatient/ED Visit) (Signed)
   11/08/2023  Name: Claudell Nolden MRN: [id number] DOB: 09-26-1981  Today's TOC FU Call Status: Today's TOC FU Call Status:: Unsuccessful Call (3rd Attempt) Unsuccessful Call (1st Attempt) Date: 11/04/23 Unsuccessful Call (2nd Attempt) Date: 11/08/23 Unsuccessful Call (3rd Attempt) Date: 11/08/23  Attempted to reach the patient regarding the most recent Inpatient/ED visit.  Follow Up Plan: No further outreach attempts will be made at this time. We have been unable to contact the patient.  Signature Karena Addison, LPN [facility] Nurse Health Advisor Direct Dial [phone]

## 2023-11-14 ENCOUNTER — Telehealth: Payer: Self-pay

## 2023-11-14 NOTE — Transitions of Care (Post Inpatient/ED Visit) (Unsigned)
   11/14/2023  Name: Melvin Knight MRN: [id number] DOB: Sep 17, 1981  Today's TOC FU Call Status: Today's TOC FU Call Status:: Unsuccessful Call (1st Attempt) Unsuccessful Call (1st Attempt) Date: 11/14/23  Attempted to reach the patient regarding the most recent Inpatient/ED visit.  Follow Up Plan: Additional outreach attempts will be made to reach the patient to complete the Transitions of Care (Post Inpatient/ED visit) call.   Signature Karena Addison, LPN [facility] Nurse Health Advisor Direct Dial [phone]

## 2023-11-24 NOTE — Transitions of Care (Post Inpatient/ED Visit) (Signed)
   11/24/2023  Name: Roberta Kelly MRN: [id number] DOB: 07/24/1981  Today's TOC FU Call Status: Today's TOC FU Call Status:: Unsuccessful Call (3rd Attempt) Unsuccessful Call (1st Attempt) Date: 11/14/23 Unsuccessful Call (2nd Attempt) Date: 11/24/23 Unsuccessful Call (3rd Attempt) Date: 11/24/23  Attempted to reach the patient regarding the most recent Inpatient/ED visit.  Follow Up Plan: No further outreach attempts will be made at this time. We have been unable to contact the patient.  Signature Julian Lemmings, LPN [facility] Nurse Health Advisor Direct Dial [phone]

## 2023-11-24 NOTE — Transitions of Care (Post Inpatient/ED Visit) (Signed)
   11/24/2023  Name: Melvin Knight MRN: [id number] DOB: September 27, 1981  Today's TOC FU Call Status: Today's TOC FU Call Status:: Unsuccessful Call (2nd Attempt) Unsuccessful Call (1st Attempt) Date: 11/14/23 Unsuccessful Call (2nd Attempt) Date: 11/24/23  Attempted to reach the patient regarding the most recent Inpatient/ED visit.  Follow Up Plan: Additional outreach attempts will be made to reach the patient to complete the Transitions of Care (Post Inpatient/ED visit) call.   Signature Julian Lemmings, LPN [facility] Nurse Health Advisor Direct Dial [phone]

## 2023-12-07 ENCOUNTER — Ambulatory Visit: Payer: Self-pay | Admitting: Internal Medicine

## 2023-12-19 ENCOUNTER — Other Ambulatory Visit: Payer: Self-pay

## 2023-12-19 ENCOUNTER — Inpatient Hospital Stay
Admission: EM | Admit: 2023-12-19 | Discharge: 2023-12-21 | DRG: 897 | Disposition: A | Discharge status: Home / Self Care | Payer: BLUE CROSS/BLUE SHIELD | Attending: Hospitalist | Admitting: Hospitalist

## 2023-12-19 ENCOUNTER — Encounter: Payer: Self-pay | Admitting: Emergency Medicine

## 2023-12-19 DIAGNOSIS — N179 Acute kidney failure, unspecified: Secondary | ICD-10-CM | POA: Diagnosis present

## 2023-12-19 DIAGNOSIS — Z8249 Family history of ischemic heart disease and other diseases of the circulatory system: Secondary | ICD-10-CM

## 2023-12-19 DIAGNOSIS — F10932 Alcohol use, unspecified with withdrawal with perceptual disturbance: Principal | ICD-10-CM

## 2023-12-19 DIAGNOSIS — F32A Depression, unspecified: Secondary | ICD-10-CM | POA: Diagnosis present

## 2023-12-19 DIAGNOSIS — I10 Essential (primary) hypertension: Secondary | ICD-10-CM | POA: Diagnosis present

## 2023-12-19 DIAGNOSIS — I160 Hypertensive urgency: Secondary | ICD-10-CM | POA: Diagnosis present

## 2023-12-19 DIAGNOSIS — F10939 Alcohol use, unspecified with withdrawal, unspecified: Secondary | ICD-10-CM | POA: Diagnosis present

## 2023-12-19 DIAGNOSIS — F10232 Alcohol dependence with withdrawal with perceptual disturbance: Secondary | ICD-10-CM | POA: Diagnosis present

## 2023-12-19 DIAGNOSIS — F419 Anxiety disorder, unspecified: Secondary | ICD-10-CM | POA: Diagnosis present

## 2023-12-19 DIAGNOSIS — Z720 Tobacco use: Secondary | ICD-10-CM | POA: Diagnosis not present

## 2023-12-19 DIAGNOSIS — Z823 Family history of stroke: Secondary | ICD-10-CM | POA: Diagnosis not present

## 2023-12-19 DIAGNOSIS — F17210 Nicotine dependence, cigarettes, uncomplicated: Secondary | ICD-10-CM | POA: Diagnosis present

## 2023-12-19 DIAGNOSIS — Z79899 Other long term (current) drug therapy: Secondary | ICD-10-CM | POA: Diagnosis not present

## 2023-12-19 DIAGNOSIS — G629 Polyneuropathy, unspecified: Secondary | ICD-10-CM | POA: Diagnosis present

## 2023-12-19 DIAGNOSIS — D696 Thrombocytopenia, unspecified: Secondary | ICD-10-CM | POA: Diagnosis present

## 2023-12-19 DIAGNOSIS — F10931 Alcohol use, unspecified with withdrawal delirium: Secondary | ICD-10-CM | POA: Diagnosis present

## 2023-12-19 DIAGNOSIS — Z8659 Personal history of other mental and behavioral disorders: Secondary | ICD-10-CM

## 2023-12-19 DIAGNOSIS — F10231 Alcohol dependence with withdrawal delirium: Principal | ICD-10-CM | POA: Diagnosis present

## 2023-12-19 DIAGNOSIS — Z87898 Personal history of other specified conditions: Secondary | ICD-10-CM

## 2023-12-19 LAB — COMPREHENSIVE METABOLIC PANEL
ALT: 34 U/L (ref 0–44)
AST: 69 U/L — ABNORMAL HIGH (ref 15–41)
Albumin: 4.3 g/dL (ref 3.5–5.0)
Alkaline Phosphatase: 148 U/L — ABNORMAL HIGH (ref 38–126)
Anion gap: 20 — ABNORMAL HIGH (ref 5–15)
BUN: 27 mg/dL — ABNORMAL HIGH (ref 6–20)
CO2: 18 mmol/L — ABNORMAL LOW (ref 22–32)
Calcium: 9.3 mg/dL (ref 8.9–10.3)
Chloride: 101 mmol/L (ref 98–111)
Creatinine, Ser: 1.83 mg/dL — ABNORMAL HIGH (ref 0.61–1.24)
GFR, Estimated: 47 mL/min — ABNORMAL LOW (ref >60)
Glucose, Bld: 167 mg/dL — ABNORMAL HIGH (ref 70–99)
Potassium: 3.5 mmol/L (ref 3.5–5.1)
Sodium: 139 mmol/L (ref 135–145)
Total Bilirubin: 1.5 mg/dL — ABNORMAL HIGH (ref 0.0–1.2)
Total Protein: 8.1 g/dL (ref 6.5–8.1)

## 2023-12-19 LAB — ETHANOL: Alcohol, Ethyl (B): <10 mg/dL (ref <10)

## 2023-12-19 LAB — CBC
HCT: 35.7 % — ABNORMAL LOW (ref 39.0–52.0)
Hemoglobin: 13.2 g/dL (ref 13.0–17.0)
MCH: 35.4 pg — ABNORMAL HIGH (ref 26.0–34.0)
MCHC: 37 g/dL — ABNORMAL HIGH (ref 30.0–36.0)
MCV: 95.7 fL (ref 80.0–100.0)
Platelets: 92 10*3/uL — ABNORMAL LOW (ref 150–400)
RBC: 3.73 MIL/uL — ABNORMAL LOW (ref 4.22–5.81)
RDW: 12.3 % (ref 11.5–15.5)
WBC: 4.8 10*3/uL (ref 4.0–10.5)
nRBC: 0 % (ref 0.0–0.2)

## 2023-12-19 MED ORDER — ONDANSETRON HCL 4 MG/2ML IJ SOLN: 4.0000 mg | Freq: Four times a day (QID) | INTRAMUSCULAR | Status: DC | PRN

## 2023-12-19 MED ORDER — METOPROLOL SUCCINATE ER 25 MG PO TB24
12.5000 mg | ORAL_TABLET | Freq: Every morning | ORAL | Status: DC
  Administered 2023-12-20 – 2023-12-21 (×2): 12.5 mg via ORAL
  Filled 2023-12-19 (×2): qty 1

## 2023-12-19 MED ORDER — THIAMINE HCL 100 MG/ML IJ SOLN: 100.0000 mg | Freq: Every day | INTRAMUSCULAR | Status: DC

## 2023-12-19 MED ORDER — LOSARTAN POTASSIUM 50 MG PO TABS
50.0000 mg | ORAL_TABLET | Freq: Every day | ORAL | Status: DC
  Administered 2023-12-19 – 2023-12-21 (×3): 50 mg via ORAL
  Filled 2023-12-19 (×3): qty 1

## 2023-12-19 MED ORDER — ONDANSETRON HCL 4 MG PO TABS: 4.0000 mg | ORAL_TABLET | Freq: Four times a day (QID) | ORAL | Status: DC | PRN

## 2023-12-19 MED ORDER — VITAMIN B-12 100 MCG PO TABS
100.0000 ug | ORAL_TABLET | Freq: Every day | ORAL | Status: DC
  Administered 2023-12-19 – 2023-12-21 (×3): 100 ug via ORAL
  Filled 2023-12-19 (×4): qty 1

## 2023-12-19 MED ORDER — ACETAMINOPHEN 325 MG PO TABS: 650.0000 mg | ORAL_TABLET | Freq: Four times a day (QID) | ORAL | Status: DC | PRN

## 2023-12-19 MED ORDER — ENOXAPARIN SODIUM 40 MG/0.4ML IJ SOSY
40.0000 mg | PREFILLED_SYRINGE | INTRAMUSCULAR | Status: DC
Start: 2023-12-19 — End: 2023-12-21
  Administered 2023-12-19: 40 mg via SUBCUTANEOUS
  Filled 2023-12-19 (×2): qty 0.4

## 2023-12-19 MED ORDER — MAGNESIUM HYDROXIDE 400 MG/5ML PO SUSP: 30.0000 mL | Freq: Every day | ORAL | Status: DC | PRN

## 2023-12-19 MED ORDER — GABAPENTIN 300 MG PO CAPS
300.0000 mg | ORAL_CAPSULE | Freq: Three times a day (TID) | ORAL | Status: DC
  Administered 2023-12-19 – 2023-12-21 (×3): 300 mg via ORAL
  Filled 2023-12-19 (×3): qty 1

## 2023-12-19 MED ORDER — LORAZEPAM 2 MG/ML IJ SOLN
1.0000 mg | INTRAMUSCULAR | Status: DC | PRN
  Administered 2023-12-19 – 2023-12-20 (×3): 2 mg via INTRAVENOUS
  Administered 2023-12-20 (×2): 4 mg via INTRAVENOUS
  Administered 2023-12-20: 2 mg via INTRAVENOUS
  Filled 2023-12-19 (×2): qty 1
  Filled 2023-12-19 (×2): qty 2
  Filled 2023-12-19 (×2): qty 1

## 2023-12-19 MED ORDER — HYDROXYZINE HCL 10 MG PO TABS
10.0000 mg | ORAL_TABLET | Freq: Three times a day (TID) | ORAL | Status: DC | PRN
  Filled 2023-12-19: qty 1

## 2023-12-19 MED ORDER — ADULT MULTIVITAMIN W/MINERALS CH
1.0000 | ORAL_TABLET | Freq: Every day | ORAL | Status: DC
  Administered 2023-12-19 – 2023-12-21 (×3): 1 via ORAL
  Filled 2023-12-19 (×3): qty 1

## 2023-12-19 MED ORDER — ESCITALOPRAM OXALATE 10 MG PO TABS: 20.0000 mg | ORAL_TABLET | Freq: Every day | ORAL | Status: DC

## 2023-12-19 MED ORDER — MULTI-VITAMIN/MINERALS PO TABS: 1.0000 | ORAL_TABLET | Freq: Every day | ORAL | Status: DC

## 2023-12-19 MED ORDER — POTASSIUM CHLORIDE CRYS ER 20 MEQ PO TBCR
20.0000 meq | EXTENDED_RELEASE_TABLET | Freq: Every morning | ORAL | Status: DC
  Administered 2023-12-20: 20 meq via ORAL
  Filled 2023-12-19: qty 1

## 2023-12-19 MED ORDER — SODIUM CHLORIDE 0.9 % IV SOLN
INTRAVENOUS | Status: AC
Start: 2023-12-19 — End: 2023-12-20

## 2023-12-19 MED ORDER — PHENOBARBITAL SODIUM 130 MG/ML IJ SOLN
130.0000 mg | Freq: Once | INTRAMUSCULAR | Status: AC
  Administered 2023-12-19: 130 mg via INTRAVENOUS
  Filled 2023-12-19: qty 1

## 2023-12-19 MED ORDER — ACETAMINOPHEN 325 MG RE SUPP: 650.0000 mg | Freq: Four times a day (QID) | RECTAL | Status: DC | PRN

## 2023-12-19 MED ORDER — FLUOXETINE HCL 20 MG PO CAPS
40.0000 mg | ORAL_CAPSULE | Freq: Every morning | ORAL | Status: DC
  Administered 2023-12-20 – 2023-12-21 (×2): 40 mg via ORAL
  Filled 2023-12-19 (×2): qty 2

## 2023-12-19 MED ORDER — LORAZEPAM 1 MG PO TABS
1.0000 mg | ORAL_TABLET | ORAL | Status: DC | PRN
  Administered 2023-12-20 (×2): 2 mg via ORAL
  Filled 2023-12-19 (×2): qty 2

## 2023-12-19 MED ORDER — THIAMINE MONONITRATE 100 MG PO TABS
100.0000 mg | ORAL_TABLET | Freq: Every day | ORAL | Status: DC
  Administered 2023-12-19 – 2023-12-21 (×3): 100 mg via ORAL
  Filled 2023-12-19 (×3): qty 1

## 2023-12-19 MED ORDER — FOLIC ACID 1 MG PO TABS
1.0000 mg | ORAL_TABLET | Freq: Every day | ORAL | Status: DC
  Administered 2023-12-19 – 2023-12-21 (×3): 1 mg via ORAL
  Filled 2023-12-19 (×3): qty 1

## 2023-12-19 MED ORDER — TRAZODONE HCL 50 MG PO TABS
50.0000 mg | ORAL_TABLET | Freq: Every evening | ORAL | Status: DC | PRN
  Administered 2023-12-20: 100 mg via ORAL
  Filled 2023-12-19: qty 2

## 2023-12-19 MED ORDER — THIAMINE HCL 100 MG PO TABS: 100.0000 mg | ORAL_TABLET | Freq: Every morning | ORAL | Status: DC

## 2023-12-19 MED ORDER — GUAIFENESIN ER 600 MG PO TB12: 1200.0000 mg | ORAL_TABLET | Freq: Two times a day (BID) | ORAL | Status: DC | PRN

## 2023-12-19 MED ORDER — FOLIC ACID 1 MG PO TABS: 1.0000 mg | ORAL_TABLET | Freq: Every day | ORAL | Status: DC

## 2023-12-19 MED ORDER — HYDRALAZINE HCL 50 MG PO TABS
50.0000 mg | ORAL_TABLET | Freq: Two times a day (BID) | ORAL | Status: DC
  Administered 2023-12-19 – 2023-12-21 (×3): 50 mg via ORAL
  Filled 2023-12-19 (×3): qty 1

## 2023-12-19 MED ORDER — SODIUM CHLORIDE 0.9 % IV BOLUS
1000.0000 mL | Freq: Once | INTRAVENOUS | Status: AC
  Administered 2023-12-19: 1000 mL via INTRAVENOUS

## 2023-12-19 NOTE — ED Provider Triage Note (Signed)
Emergency Medicine Provider Triage Evaluation Note  Melvin Knight , a 43 y.o. male  was evaluated in triage.  Pt complains of alcohol withdrawal, last drink was last night, drinks 5-6 per day.  Review of Systems  Positive: Hallucinations, tremors Negative:   Physical Exam  BP (!) 156/105 (BP Location: Left Arm)   Pulse (!) 119   Temp 98.6 F (37 C) (Oral)   Resp 20   Ht 5\' 7"  (1.702 m)   Wt 83.9 kg   SpO2 97%   BMI 28.98 kg/m  Gen:   Awake, no distress   Resp:  Normal effort  MSK:   Moves extremities without difficulty  Other:  Fully body tremors  Medical Decision Making  Medically screening exam initiated at 6:22 PM.  Appropriate orders placed.  Tomasita Morrow was informed that the remainder of the evaluation will be completed by another provider, this initial triage assessment does not replace that evaluation, and the importance of remaining in the ED until their evaluation is complete.    Cameron Ali, PA-C 12/19/23 1824

## 2023-12-19 NOTE — ED Notes (Signed)
First Nurse Note: Pt to ED via [facility] from home for alcohol withdrawals. Per EMS pt is diaphoretic, vomiting, and shaking. Last drink was Yesterday. CBG 138. Pt is a daily drinker. HR 115-130. SpO2 97% on room air.  Bp 190/110 Pt given 2 mg of Versed IM by EMS in route

## 2023-12-19 NOTE — ED Provider Notes (Addendum)
[facility] Provider Note    Event Date/Time   First MD Initiated Contact with Patient 12/19/23 1847     (approximate)   History   Alcohol Problem   HPI Melvin Knight is a 43 y.o. male with history of alcohol abuse, prior withdrawal seizures presenting today for alcohol withdrawal.  Patient states he has not had any alcohol in the past 24 hours.  He started developing a tremor overnight and sweats which is worsened.  He reports having some hallucination overnight but none since then.  Has some nausea and decreased p.o. intake.  Otherwise denies chest pain, shortness of breath, diarrhea, dysuria.  Patient reports having 5-6 drinks per day.     Physical Exam   Triage Vital Signs: ED Triage Vitals  Encounter Vitals Group     BP 12/19/23 1817 (!) 156/105     Systolic BP Percentile --      Diastolic BP Percentile --      Pulse Rate 12/19/23 1817 (!) 119     Resp 12/19/23 1817 20     Temp 12/19/23 1817 98.6 F (37 C)     Temp Source 12/19/23 1817 Oral     SpO2 12/19/23 1817 97 %     Weight 12/19/23 1818 185 lb (83.9 kg)     Height 12/19/23 1818 5\' 7"  (1.702 m)     Head Circumference --      Peak Flow --      Pain Score 12/19/23 1818 0     Pain Loc --      Pain Education --      Exclude from Growth Chart --     Most recent vital signs: Vitals:   12/19/23 2100 12/19/23 2113  BP: (!) 180/118   Pulse: 91   Resp: 13   Temp:    SpO2: 99% 99%   Physical Exam: I have reviewed the vital signs and nursing notes. General: Awake, alert, visibly tremulous and diaphoretic Head:  Atraumatic, normocephalic.   ENT:  EOM intact, PERRL. Oral mucosa is pink and moist with no lesions. Neck: Neck is supple with full range of motion, No meningeal signs. Cardiovascular:  tachycardic, RR, No murmurs. Peripheral pulses palpable and equal bilaterally. Respiratory:  Symmetrical chest wall expansion.  No rhonchi, rales, or wheezes.  Good air movement throughout.  No  use of accessory muscles.   Musculoskeletal:  No cyanosis or edema. Moving extremities with full ROM Abdomen:  Soft, nontender, nondistended. Neuro:  GCS 15, moving all four extremities, interacting appropriately. Speech clear. Psych:  Calm, appropriate.   Skin:  Warm, diaphoretic    ED Results / Procedures / Treatments   Labs (all labs ordered are listed, but only abnormal results are displayed) Labs Reviewed  COMPREHENSIVE METABOLIC PANEL - Abnormal; Notable for the following components:      Result Value   CO2 18 (*)    Glucose, Bld 167 (*)    BUN 27 (*)    Creatinine, Ser 1.83 (*)    AST 69 (*)    Alkaline Phosphatase 148 (*)    Total Bilirubin 1.5 (*)    GFR, Estimated 47 (*)    Anion gap 20 (*)    All other components within normal limits  CBC - Abnormal; Notable for the following components:   RBC 3.73 (*)    HCT 35.7 (*)    MCH 35.4 (*)    MCHC 37.0 (*)    Platelets 92 (*)  All other components within normal limits  ETHANOL  URINE DRUG SCREEN, QUALITATIVE ([facility] ONLY)  COMPREHENSIVE METABOLIC PANEL  CBC     EKG My EKG interpretation: Rate of 91, normal sinus rhythm, normal axis, normal intervals.  No acute ST elevation or depression   RADIOLOGY    PROCEDURES:  Critical Care performed: Yes, see critical care procedure note(s)  .Critical Care  Performed by: Janith Lima, MD Authorized by: Janith Lima, MD   Critical care provider statement:    Critical care time (minutes):  30   Critical care was necessary to treat or prevent imminent or life-threatening deterioration of the following conditions: delirium tremens.   Critical care was time spent personally by me on the following activities:  Development of treatment plan with patient or surrogate, discussions with consultants, evaluation of patient's response to treatment, examination of patient, ordering and review of laboratory studies, ordering and review of radiographic studies, ordering and  performing treatments and interventions, pulse oximetry, re-evaluation of patient's condition and review of old charts   I assumed direction of critical care for this patient from another provider in my specialty: no     Care discussed with: admitting provider      MEDICATIONS ORDERED IN ED: Medications  LORazepam (ATIVAN) tablet 1-4 mg ( Oral See Alternative 12/19/23 1844)    Or  LORazepam (ATIVAN) injection 1-4 mg (2 mg Intravenous Given 12/19/23 1844)  thiamine (VITAMIN B1) tablet 100 mg (100 mg Oral Given 12/19/23 1929)    Or  thiamine (VITAMIN B1) injection 100 mg ( Intravenous See Alternative 12/19/23 1929)  folic acid (FOLVITE) tablet 1 mg (1 mg Oral Given 12/19/23 1929)  multivitamin with minerals tablet 1 tablet (1 tablet Oral Given 12/19/23 1929)  hydrALAZINE (APRESOLINE) tablet 50 mg (has no administration in time range)  losartan (COZAAR) tablet 50 mg (has no administration in time range)  metoprolol succinate (TOPROL-XL) 24 hr tablet 12.5 mg (has no administration in time range)  escitalopram (LEXAPRO) tablet 20 mg (has no administration in time range)  FLUoxetine (PROZAC) capsule 40 mg (has no administration in time range)  hydrOXYzine (ATARAX) tablet 10 mg (has no administration in time range)  traZODone (DESYREL) tablet 50-100 mg (has no administration in time range)  vitamin B-12 (CYANOCOBALAMIN) tablet 100 mcg (has no administration in time range)  gabapentin (NEURONTIN) capsule 300 mg (has no administration in time range)  potassium chloride SA (KLOR-CON M) CR tablet 20 mEq (has no administration in time range)  guaiFENesin (MUCINEX) 12 hr tablet 1,200 mg (has no administration in time range)  enoxaparin (LOVENOX) injection 40 mg (has no administration in time range)  0.9 %  sodium chloride infusion (has no administration in time range)  acetaminophen (TYLENOL) tablet 650 mg (has no administration in time range)    Or  acetaminophen (TYLENOL) suppository 650 mg (has no  administration in time range)  magnesium hydroxide (MILK OF MAGNESIA) suspension 30 mL (has no administration in time range)  ondansetron (ZOFRAN) tablet 4 mg (has no administration in time range)    Or  ondansetron (ZOFRAN) injection 4 mg (has no administration in time range)  PHENObarbital (LUMINAL) injection 130 mg (130 mg Intravenous Given 12/19/23 1929)  sodium chloride 0.9 % bolus 1,000 mL (1,000 mLs Intravenous New Bag/Given 12/19/23 2154)     IMPRESSION / MDM / ASSESSMENT AND PLAN / ED COURSE  I reviewed the triage vital signs and the nursing notes.  Differential diagnosis includes, but is not limited to, alcohol withdrawals, delirium tremens, tremors, dehydration, AKI, electrolyte abnormality  Patient's presentation is most consistent with acute presentation with potential threat to life or bodily function.  Patient is a 43 year old male presenting today for alcohol withdrawal symptoms with concern for severe features.  Notably elevated CIWA score with tachycardia and tremors.  Admitting to hallucinations intermittently over the past 24 hours as well.  Initially given Ativan and then dosed with phenobarbital.  Laboratory workup notable for AKI and patient received a total of 2 L of fluid.  Did result in symptomatic management.  I discussed whether he is actively seeking detox treatment versus if he was just looking for symptomatic treatment today and will continue drinking.  He wishes to detox.  Given his history of prior seizures and concern for being in delirium tremens, will admit to hospitalist for further care.  The patient is on the cardiac monitor to evaluate for evidence of arrhythmia and/or significant heart rate changes. 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   Janith Lima, MD 12/19/23 2215    Janith Lima, MD 12/19/23 2216

## 2023-12-19 NOTE — ED Notes (Signed)
Pt provided with lunch tray. Sitting up in bed eating at this time

## 2023-12-19 NOTE — H&P (Incomplete)
[place]   PATIENT NAME: Melvin Knight    MR#:  [id number]  DATE OF BIRTH:  17-Feb-1981  DATE OF ADMISSION:  12/19/2023  PRIMARY CARE PHYSICIAN: Margarita Mail, DO   Patient is coming from: Home  REQUESTING/REFERRING PHYSICIAN: Claudell Kyle, MD  CHIEF COMPLAINT:   Chief Complaint  Patient presents with   Alcohol Problem    HISTORY OF PRESENT ILLNESS:  Melvin Knight is a 43 y.o. Caucasian male with medical history significant for alcohol abuse and dependence, anxiety and hypertension, who presented to the emergency room with acute onset of tremors that given difficulty with ambulation as well as nausea and vomiting.  He admitted to mild dyspnea without chest pain or palpitations.  No cough or wheezing.  No diarrhea or melena or bright red blood per rectum.  No bilious vomitus or hematemesis.  No dysuria, oliguria or hematuria or flank pain.  He usually drinks 6 liquor drinks/shots per day.  His last drink was 24 to 36 hours ago.  ED Course: When he came to the ER, BP was 156/105 with heart rate of 119 and otherwise normal vital signs.  Labs revealed blood glucose 167 with a CO2 of 18 and BUN 27 creatinine 1.83 anion gap of 20 and alk phos 148 with AST of 69 total bili 1.5.  CBC showed microcytosis and thrombocytopenia.  Alcohol level was less than 10. EKG as reviewed by me :  EKG showed normal sinus rhythm with a rate of 91., Imaging: None.  The patient was given 2 mg of IV Ativan and 130 mg of IV phenobarbital in addition to folic acid, and thiamine as well as multivitamins.  The patient was fairly interested in detoxification.  He will be admitted to a progressive unit bed for further evaluation and management. PAST MEDICAL HISTORY:   Past Medical History:  Diagnosis Date   AKI (acute kidney injury) (HCC) 09/24/2022   Anxiety    Hypertension    Hypokalemia    Hypokalemia 09/24/2022   Hypomagnesemia 09/24/2022  -Alcohol abuse  PAST SURGICAL HISTORY:  History  reviewed. No pertinent surgical history.  SOCIAL HISTORY:   Social History   Tobacco Use   Smoking status: Every Day    Current packs/day: 1.00    Types: Cigarettes   Smokeless tobacco: Never  Substance Use Topics   Alcohol use: Yes    Alcohol/week: 6.0 standard drinks of alcohol    Types: 6 Shots of liquor per week    Comment: 6 cocktails per day    FAMILY HISTORY:   Family History  Problem Relation Age of Onset   Hypertension Father    Stroke Father    Cancer Sister     DRUG ALLERGIES:  No Known Allergies  REVIEW OF SYSTEMS:   ROS As per history of present illness. All pertinent systems were reviewed above. Constitutional, HEENT, cardiovascular, respiratory, GI, GU, musculoskeletal, neuro, psychiatric, endocrine, integumentary and hematologic systems were reviewed and are otherwise negative/unremarkable except for positive findings mentioned above in the HPI.   MEDICATIONS AT HOME:   Prior to Admission medications   Medication Sig Start Date End Date Taking? Authorizing Provider  escitalopram (LEXAPRO) 20 MG tablet Take 1 tablet (20 mg total) by mouth daily. 09/01/23 08/31/24  Gillis Santa, MD  FLUoxetine (PROZAC) 40 MG capsule Take 40 mg by mouth every morning. 10/17/23   [provider]  folic acid (FOLVITE) 1 MG tablet Take 1 tablet (1 mg total) by mouth daily. 09/26/22  Esaw Grandchild A, DO  gabapentin (NEURONTIN) 300 MG capsule Take 300 mg by mouth 3 (three) times daily. 06/04/23   [provider]  hydrALAZINE (APRESOLINE) 50 MG tablet Take 1 tablet (50 mg total) by mouth 2 (two) times daily. If systolic BP greater than 150 mmHg. 10/06/23   Margarita Mail, DO  hydrocortisone 1 % ointment Apply 1 Application topically 2 (two) times daily. 09/06/23   Margarita Mail, DO  hydrOXYzine (ATARAX) 10 MG tablet Take 1 tablet (10 mg total) by mouth 3 (three) times daily as needed for anxiety. 03/17/23   Margarita Mail, DO  losartan (COZAAR) 50  MG tablet Take 1 tablet (50 mg total) by mouth daily. 09/02/23 09/01/24  Gillis Santa, MD  MAGNESIUM PO Take by mouth.    [provider]  melatonin 3 MG TABS tablet Take 6 mg by mouth at bedtime as needed. 06/04/23   [provider]  metoprolol succinate (TOPROL-XL) 25 MG 24 hr tablet Take 12.5 mg by mouth every morning. 06/04/23   [provider]  mometasone (ELOCON) 0.1 % cream Apply 1 Application topically in the morning and at bedtime. 06/13/23   [provider]  MUCINEX 600 MG 12 hr tablet Take 1,200 mg by mouth 2 (two) times daily as needed. 06/23/23   [provider]  Multiple Vitamins-Minerals (MULTIVITAMIN WITH MINERALS) tablet Take 1 tablet by mouth daily. 07/06/22   Margarita Mail, DO  Potassium Chloride ER 20 MEQ TBCR Take 1 tablet by mouth every morning. 06/04/23   [provider]  POTASSIUM PO Take by mouth.    [provider]  thiamine (VITAMIN B1) 100 MG tablet Take 100 mg by mouth every morning. 06/04/23   [provider]  traZODone (DESYREL) 50 MG tablet Take 50-100 mg by mouth at bedtime as needed. 06/04/23   [provider]  vitamin B-12 (CYANOCOBALAMIN) 100 MCG tablet Take 100 mcg by mouth daily.    [provider]      VITAL SIGNS:  Blood pressure (!) 152/101, pulse (!) 104, temperature 98.6 F (37 C), temperature source Oral, resp. rate 18, height 5\' 7"  (1.702 m), weight 83.9 kg, SpO2 95%.  PHYSICAL EXAMINATION:  Physical Exam  GENERAL:  43 y.o.-year-old Caucasian male patient lying in the bed with no acute distress.  EYES: Pupils equal, round, reactive to light and accommodation. No scleral icterus. Extraocular muscles intact.  HEENT: Head atraumatic, normocephalic. Oropharynx and nasopharynx clear.  NECK:  Supple, no jugular venous distention. No thyroid enlargement, no tenderness.  LUNGS: Normal breath sounds bilaterally, no wheezing, rales,rhonchi or crepitation. No use of  accessory muscles of respiration.  CARDIOVASCULAR: Regular rate and rhythm, S1, S2 normal. No murmurs, rubs, or gallops.  ABDOMEN: Soft, nondistended, nontender. Bowel sounds present. No organomegaly or mass.  EXTREMITIES: No pedal edema, cyanosis, or clubbing.  NEUROLOGIC: Cranial nerves II through XII are intact. Muscle strength 5/5 in all extremities. Sensation intact. Gait not checked.  PSYCHIATRIC: The patient is alert and oriented x 3.  Normal affect and good eye contact. SKIN: No obvious rash, lesion, or ulcer.   LABORATORY PANEL:   CBC Recent Labs  Lab 12/19/23 1831  WBC 4.8  HGB 13.2  HCT 35.7*  PLT 92*   ------------------------------------------------------------------------------------------------------------------  Chemistries  Recent Labs  Lab 12/19/23 1831  NA 139  K 3.5  CL 101  CO2 18*  GLUCOSE 167*  BUN 27*  CREATININE 1.83*  CALCIUM 9.3  AST 69*  ALT 34  ALKPHOS 148*  BILITOT 1.5*   ------------------------------------------------------------------------------------------------------------------  Cardiac Enzymes No results for input(s): "TROPONINI" in the last 168 hours. ------------------------------------------------------------------------------------------------------------------  RADIOLOGY:  No results found.    IMPRESSION AND PLAN:  Assessment and Plan: * Alcohol withdrawal delirium (HCC) - The patient be admitted to a unit bed. - We will continue the patient on CIWA protocol. - The patient be continued on folic acid, multivitamins and thiamine. - She was counseled for alcohol cessation. - Psychiatry consult will be obtained to assess for detoxification.  Hypertensive urgency - The patient will be placed on as needed IV labetalol. - This is like secondary to #1. - We will continue antihypertensive therapy.  Anxiety and depression - We will continue SSRI therapy and Ativan.  Tobacco abuse I counseled the patient for smoking  cessation and the patient will receive further counseling here.   Peripheral neuropathy - We will continue Neurontin.   DVT prophylaxis: Lovenox.  Advanced Care Planning:  Code Status: full code.  Family Communication:  The plan of care was discussed in details with the patient (and family). I answered all questions. The patient agreed to proceed with the above mentioned plan. Further management will depend upon hospital course. Disposition Plan: Back to previous home environment Consults called: none.  All the records are reviewed and case discussed with ED provider.  Status is: Inpatient   At the time of the admission, it appears that the appropriate admission status for this patient is inpatient.  This is judged to be reasonable and necessary in order to provide the required intensity of service to ensure the patient's safety given the presenting symptoms, physical exam findings and initial radiographic and laboratory data in the context of comorbid conditions.  The patient requires inpatient status due to high intensity of service, high risk of further deterioration and high frequency of surveillance required.  I certify that at the time of admission, it is my clinical judgment that the patient will require inpatient hospital care extending more than 2 midnights.                            Dispo: The patient is from: Home              Anticipated d/c is to: Home              Patient currently is not medically stable to d/c.              Difficult to place patient: No  Hannah Beat M.D on 12/20/2023 at 2:42 AM  [facility]   From 7 PM-7 AM, contact night-coverage [email or web address]  CC: Primary care physician; Margarita Mail, DO

## 2023-12-19 NOTE — ED Triage Notes (Signed)
Pt to ED via [facility] from home. Pt here for alcohol withdrawals. Pt states that he drinks 5-6 drinks per day. Pt states that his last drink. Pt has visible tremors, and sweats.

## 2023-12-20 ENCOUNTER — Other Ambulatory Visit (HOSPITAL_COMMUNITY): Payer: Self-pay | Admitting: Psychiatry

## 2023-12-20 DIAGNOSIS — F10930 Alcohol use, unspecified with withdrawal, uncomplicated: Secondary | ICD-10-CM

## 2023-12-20 DIAGNOSIS — G629 Polyneuropathy, unspecified: Secondary | ICD-10-CM

## 2023-12-20 DIAGNOSIS — F10931 Alcohol use, unspecified with withdrawal delirium: Secondary | ICD-10-CM | POA: Diagnosis not present

## 2023-12-20 LAB — CBC
HCT: 31.2 % — ABNORMAL LOW (ref 39.0–52.0)
Hemoglobin: 11.4 g/dL — ABNORMAL LOW (ref 13.0–17.0)
MCH: 36 pg — ABNORMAL HIGH (ref 26.0–34.0)
MCHC: 36.5 g/dL — ABNORMAL HIGH (ref 30.0–36.0)
MCV: 98.4 fL (ref 80.0–100.0)
Platelets: 73 10*3/uL — ABNORMAL LOW (ref 150–400)
RBC: 3.17 MIL/uL — ABNORMAL LOW (ref 4.22–5.81)
RDW: 12.4 % (ref 11.5–15.5)
WBC: 2.9 10*3/uL — ABNORMAL LOW (ref 4.0–10.5)
nRBC: 0 % (ref 0.0–0.2)

## 2023-12-20 LAB — COMPREHENSIVE METABOLIC PANEL
ALT: 25 U/L (ref 0–44)
AST: 58 U/L — ABNORMAL HIGH (ref 15–41)
Albumin: 3.5 g/dL (ref 3.5–5.0)
Alkaline Phosphatase: 116 U/L (ref 38–126)
Anion gap: 11 (ref 5–15)
BUN: 24 mg/dL — ABNORMAL HIGH (ref 6–20)
CO2: 24 mmol/L (ref 22–32)
Calcium: 8.3 mg/dL — ABNORMAL LOW (ref 8.9–10.3)
Chloride: 103 mmol/L (ref 98–111)
Creatinine, Ser: 1.3 mg/dL — ABNORMAL HIGH (ref 0.61–1.24)
GFR, Estimated: >60 mL/min (ref >60)
Glucose, Bld: 113 mg/dL — ABNORMAL HIGH (ref 70–99)
Potassium: 3 mmol/L — ABNORMAL LOW (ref 3.5–5.1)
Sodium: 138 mmol/L (ref 135–145)
Total Bilirubin: 1.6 mg/dL — ABNORMAL HIGH (ref 0.0–1.2)
Total Protein: 6.9 g/dL (ref 6.5–8.1)

## 2023-12-20 MED ORDER — HALOPERIDOL LACTATE 5 MG/ML IJ SOLN
5.0000 mg | Freq: Once | INTRAMUSCULAR | Status: AC
  Administered 2023-12-20: 5 mg via INTRAMUSCULAR
  Filled 2023-12-20: qty 1

## 2023-12-20 MED ORDER — POTASSIUM CHLORIDE CRYS ER 20 MEQ PO TBCR
40.0000 meq | EXTENDED_RELEASE_TABLET | Freq: Once | ORAL | Status: AC
Start: 2023-12-20 — End: 2023-12-20
  Administered 2023-12-20: 40 meq via ORAL
  Filled 2023-12-20: qty 2

## 2023-12-20 MED ORDER — DEXMEDETOMIDINE HCL IN NACL 400 MCG/100ML IV SOLN
0.0000 ug/kg/h | INTRAVENOUS | Status: DC
  Administered 2023-12-20: 0.1 ug/kg/h via INTRAVENOUS
  Filled 2023-12-20: qty 100

## 2023-12-20 NOTE — Progress Notes (Signed)
PROGRESS NOTE   HPI was taken from Dr. Arville Care: Melvin Knight is a 43 y.o. Caucasian male with medical history significant for alcohol abuse and dependence, anxiety and hypertension, who presented to the emergency room with acute onset of tremors that given difficulty with ambulation as well as nausea and vomiting.  He admitted to mild dyspnea without chest pain or palpitations.  No cough or wheezing.  No diarrhea or melena or bright red blood per rectum.  No bilious vomitus or hematemesis.  No dysuria, oliguria or hematuria or flank pain.  He usually drinks 6 liquor drinks/shots per day.  His last drink was 24 to 36 hours ago.   ED Course: When he came to the ER, BP was 156/105 with heart rate of 119 and otherwise normal vital signs.  Labs revealed blood glucose 167 with a CO2 of 18 and BUN 27 creatinine 1.83 anion gap of 20 and alk phos 148 with AST of 69 total bili 1.5.  CBC showed microcytosis and thrombocytopenia.  Alcohol level was less than 10. EKG as reviewed by me :  EKG showed normal sinus rhythm with a rate of 91., Imaging: None.   The patient was given 2 mg of IV Ativan and 130 mg of IV phenobarbital in addition to folic acid, and thiamine as well as multivitamins.  The patient was fairly interested in detoxification.  He will be admitted to a progressive unit bed for further evaluation and management.   Orry Sigl  BMW:[id number] DOB: 1981-01-10 DOA: 12/19/2023 PCP: Margarita Mail, DO   Assessment & Plan:   Principal Problem:   Alcohol withdrawal delirium (HCC) Active Problems:   Hypertensive urgency   Anxiety and depression   Tobacco abuse   Peripheral neuropathy  Assessment and Plan: Alcohol withdrawal delirium: continue on CIWA protocol but still having significant w/drawls so precedex started. Transfer order put in for stepdown but no beds currently. Alcohol cessation counseling. Continue on folic acid, thiamine   Hypertensive urgency: urgency resolved but still w/  HTN. Continue on metoprolol, losartan    Depression: severity unknown. Continue on home dose of fluoxetine    Tobacco abuse: received smoking cessation counseling already  Peripheral neuropathy: continue on home dose of gabapentin        DVT prophylaxis: lovenox Code Status: full  Family Communication:  Disposition Plan: depends on PT/OT recs (not consulted yet)  Level of care: Progressive  Status is: Inpatient Remains inpatient appropriate because: severity of illness    Consultants:    Procedures:  Antimicrobials:   Subjective: Pt c/o the shakes  Objective: Vitals:   12/20/23 0600 12/20/23 0603 12/20/23 0630 12/20/23 0732  BP: (!) 156/102  (!) 151/99 (!) 154/94  Pulse: (!) 106  87 86  Resp: 19  19   Temp:  98.5 F (36.9 C)    TempSrc:      SpO2: 94%  96%   Weight:      Height:       No intake or output data in the 24 hours ending 12/20/23 0907 Filed Weights   12/19/23 1818  Weight: 83.9 kg    Examination:  General exam: Appears uncomfortable  Respiratory system: Clear to auscultation. Respiratory effort normal. Cardiovascular system: S1 & S2+. No rubs, gallops or clicks. Gastrointestinal system: Abdomen is nondistended, soft and nontender.  Normal bowel sounds heard. Central nervous system: Alert and awake. Moves all extremities  Psychiatry: Judgement and insight appears at baseline. Flat mood and affect     Data Reviewed:  I have personally reviewed following labs and imaging studies  CBC: Recent Labs  Lab 12/19/23 1831 12/20/23 0520  WBC 4.8 2.9*  HGB 13.2 11.4*  HCT 35.7* 31.2*  MCV 95.7 98.4  PLT 92* 73*   Basic Metabolic Panel: Recent Labs  Lab 12/19/23 1831 12/20/23 0520  NA 139 138  K 3.5 3.0*  CL 101 103  CO2 18* 24  GLUCOSE 167* 113*  BUN 27* 24*  CREATININE 1.83* 1.30*  CALCIUM 9.3 8.3*   GFR: Estimated Creatinine Clearance: 78.2 mL/min (A) (by C-G formula based on SCr of 1.3 mg/dL (H)). Liver Function  Tests: Recent Labs  Lab 12/19/23 1831 12/20/23 0520  AST 69* 58*  ALT 34 25  ALKPHOS 148* 116  BILITOT 1.5* 1.6*  PROT 8.1 6.9  ALBUMIN 4.3 3.5   No results for input(s): "LIPASE", "AMYLASE" in the last 168 hours. No results for input(s): "AMMONIA" in the last 168 hours. Coagulation Profile: No results for input(s): "INR", "PROTIME" in the last 168 hours. Cardiac Enzymes: No results for input(s): "CKTOTAL", "CKMB", "CKMBINDEX", "TROPONINI" in the last 168 hours. BNP (last 3 results) No results for input(s): "PROBNP" in the last 8760 hours. HbA1C: No results for input(s): "HGBA1C" in the last 72 hours. CBG: No results for input(s): "GLUCAP" in the last 168 hours. Lipid Profile: No results for input(s): "CHOL", "HDL", "LDLCALC", "TRIG", "CHOLHDL", "LDLDIRECT" in the last 72 hours. Thyroid Function Tests: No results for input(s): "TSH", "T4TOTAL", "FREET4", "T3FREE", "THYROIDAB" in the last 72 hours. Anemia Panel: No results for input(s): "VITAMINB12", "FOLATE", "FERRITIN", "TIBC", "IRON", "RETICCTPCT" in the last 72 hours. Sepsis Labs: No results for input(s): "PROCALCITON", "LATICACIDVEN" in the last 168 hours.  No results found for this or any previous visit (from the past 240 hours).       Radiology Studies: No results found.      Scheduled Meds:  enoxaparin (LOVENOX) injection  40 mg Subcutaneous Q24H   FLUoxetine  40 mg Oral q morning   folic acid  1 mg Oral Daily   gabapentin  300 mg Oral TID   hydrALAZINE  50 mg Oral BID   losartan  50 mg Oral Daily   metoprolol succinate  12.5 mg Oral q morning   multivitamin with minerals  1 tablet Oral Daily   potassium chloride SA  20 mEq Oral q morning   thiamine  100 mg Oral Daily   Or   thiamine  100 mg Intravenous Daily   vitamin B-12  100 mcg Oral Daily   Continuous Infusions:  sodium chloride 100 mL/hr at 12/20/23 1610     LOS: 1 day       Charise Killian, MD [facility] Pager [phone]  [phone]  If 7PM-7AM, please contact night-coverage [email or web address] 12/20/2023, 9:07 AM
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
## 2023-12-20 NOTE — Assessment & Plan Note (Addendum)
-   The patient will be placed on as needed IV labetalol. - This is like secondary to #1. - We will continue antihypertensive therapy.

## 2023-12-20 NOTE — Assessment & Plan Note (Signed)
-  I counseled the patient for smoking cessation and the patient will receive further counseling here.

## 2023-12-20 NOTE — Assessment & Plan Note (Signed)
-   We will continue SSRI therapy and Ativan.

## 2023-12-20 NOTE — Progress Notes (Signed)
Attempted to meet with patient for psychiatric consultation. Patient is currently sedated and unable to engage in assessment. Per RN, patient has been confused, attempting to get out of bed, and making delusional statements. Receiving PRN Ativan and Haldol.   Will continue to follow and re-attempt assessment.
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
## 2023-12-20 NOTE — Assessment & Plan Note (Addendum)
-   The patient be admitted to a unit bed. - We will continue the patient on CIWA protocol. - The patient be continued on folic acid, multivitamins and thiamine. - She was counseled for alcohol cessation. - Psychiatry consult will be obtained to assess for detoxification.

## 2023-12-20 NOTE — ED Notes (Signed)
Continues to get confused and getting out of bed. Bed alarm remains activated. Patient states "I was going to answer my door, I was going to get on my computer". Patient consistently confused to his environment and thinks he is at home and has to be redirected.

## 2023-12-20 NOTE — Assessment & Plan Note (Signed)
-  We will continue Neurontin. ?

## 2023-12-20 NOTE — Discharge Instructions (Addendum)
Intensive Outpatient Programs   [facility] [address] [place],  [place], [place] [phone]  [facility] (Inpatient and outpatient)[phone] (Suboxone and Methadone) [address] [phone]  [facility] Programs - Intensive Outpatient [address] [place], [place] [address]  [phone]  [facility] (Outpatient, Inpatient, [facility] (Groups and Residental) (insurance only) [phone] [place], [place] [phone]   [facility] (for caregivers and family) [address] Laurell Josephs [address] [place], [place] [phone]  Residential Treatment Programs  [facility](2 years) Residential: 52 days)[facility] ([facility] [address] [place], [place], [place] [phone] or [phone]  [facility] [address] [place], [place], [place] [phone]  [facility] (RTS) [address] [place], [place], [place] [phone] Admissions: 8am-3pm M-F  [facility] Program: Residential Program [phone] Days)             [facility]: [facility]  [place], [place], [place]  [phone] or [phone] in Hours over the weekend or by referral)  [facility] [address] [place], [place] [address] [phone] (Do virtual or phone assessment, offer transportation within 25 miles, have in patient and Outpatient options)   Mobil Crisis: [facility]:[phone] (for crisis  response 24 hours a day)     Psychologist, counselling Name: [facility] Agency Address: [address] [place], [place] [address] Phone: [phone] Email: [email or web address] Website: [email or web address] Service(s) Offered: Housing services, self-sufficiency, congregate meal program, weatherization program, Field seismologist program, emergency food assistance,  housing counseling, home ownership program, wheels-towork program.  Agency Name: [facility] [employer] [phone]) Address: [address], [place], [place] [address] Phone: [phone] Website: [email or web address] Service(s) Offered: Transportation for [employer], subscription and demand response; Dial-a-Ride for citizens 47 years of age or older.  Agency Name: Department of Social Services Address: [address] [place], [place] [address] Phone: [phone] Service(s) Offered: Child support services; child welfare services; food stamps; Medicaid; work first family assistance; and aid with fuel,  rent, food and medicine, transportation assistance.  Agency Name: Disabled [employer] (DAV) Transportation  Network Phone: [phone] Service(s) Offered: Transports veterans to the [facility] medical center. Call  forty-eight hours in advance and leave the name, telephone  number, date, and time of appointment. Veteran will be  contacted by the driver the day before the appointment to  arrange a pick up point   Transportation Resources  Agency Name: [facility] Agency Address: [address] [place], [place] [address] Phone: [phone] Email: [email or web address] Website: [email or web address] Service(s) Offered: Housing services, self-sufficiency, congregate meal program, weatherization program, Field seismologist program, emergency food assistance,  housing counseling, home  ownership program, wheels-towork program.  Agency Name: [facility] [employer] [id number]) Address: [address], [place], [place] [address] Phone: [phone] Website: [email or web address] Service(s) Offered: Transportation for [employer], subscription and demand response; Dial-a-Ride for citizens 20 years of age or older.  Agency Name: Department of Social Services Address: [address] [place], [place] [address] Phone: [phone] Service(s) Offered: Child support services; child welfare services; food stamps; Medicaid; work first family assistance; and aid with fuel,  rent, food and medicine, transportation assistance.  Agency Name: Disabled [employer] (DAV) Transportation  Network Phone: [phone] Service(s) Offered: Transports veterans to the [facility] medical center. Call  forty-eight hours in advance and leave the name, telephone  number, date, and time of appointment. Veteran will be  contacted by the driver the day before the appointment to  arrange a pick up point    [employer] ACTA currently provides door to door services. ACTA connects with PART daily for services to [facility]. ACTA also performs contract services to [employer] operates 27 vehicles, all but 3 mini-vans are equipped with lifts for special needs as well as the general public. ACTA drivers are each CDL certified and trained in First Aid and CPR. ACTA was established in 2002 by [employer]. An independent Industrial/product designer. ACTA operates via Cytogeneticist with required Research scientist (physical sciences) from [place]. ACTA provides over 80,000 passenger trips each year, including [facility] and [employer] sites.  Call at least by 11 AM one business day prior to needing transportation  [employer].                      [place], [place]  [address]     Office Hours: Monday-Friday  8 AM - 5 PM

## 2023-12-21 DIAGNOSIS — F10931 Alcohol use, unspecified with withdrawal delirium: Secondary | ICD-10-CM | POA: Diagnosis not present

## 2023-12-21 LAB — URINE DRUG SCREEN, QUALITATIVE (ARMC ONLY)
Amphetamines, Ur Screen: NOT DETECTED
Barbiturates, Ur Screen: NOT DETECTED
Benzodiazepine, Ur Scrn: POSITIVE — AB
Cannabinoid 50 Ng, Ur ~~LOC~~: POSITIVE — AB
Cocaine Metabolite,Ur ~~LOC~~: NOT DETECTED
MDMA (Ecstasy)Ur Screen: NOT DETECTED
Methadone Scn, Ur: NOT DETECTED
Opiate, Ur Screen: POSITIVE — AB
Phencyclidine (PCP) Ur S: NOT DETECTED
Tricyclic, Ur Screen: POSITIVE — AB

## 2023-12-21 NOTE — ED Notes (Signed)
Called dietary at this time for missing meal tray. Per dietary they will send a tray up.

## 2023-12-21 NOTE — ED Notes (Addendum)
Pt got dressed and walked away from bed in hallway. This RN found pt returning to the lobby. Pt states that he walked back in because he realized that his IV was still in. This RN redirected pt back to his bed. Reached out to MD. Per MD plan was to d/c but no orders have been placed.

## 2023-12-21 NOTE — Discharge Summary (Signed)
Physician Discharge Summary   Melvin Knight  male DOB: Jul 17, 1981  UEA:[id number]  PCP: Margarita Mail, DO  Admit date: 12/19/2023 Discharge date: 12/21/2023  Admitted From: home Disposition:  home CODE STATUS: Full code   Hospital Course:  For full details, please see H&P, progress notes, consult notes and ancillary notes.  Briefly,  Melvin Knight is a 43 y.o. Caucasian male with medical history significant for alcohol abuse and dependence, anxiety and hypertension, who presented to the emergency room with acute onset of tremors that resulted in difficulty with ambulation as well as nausea and vomiting.   Alcohol withdrawal delirium:  --started on CIWA protocol but still having significant w/drawls so precedex started.  Precedex gtt was turned off next day, and pt's withdrawal symptoms resolved.  Pt was therefore discharged home as per request. Continue on folic acid, thiamine   Hypertensive urgency:  urgency resolved but still w/ HTN.  Not taking Toprol PTA, which was resumed. --cont hydralazine and losartan   Depression: severity unknown.  --not taking Lexapro PTA Continue on home dose of fluoxetine    Tobacco abuse:  received smoking cessation counseling    Unless noted above, medications under "STOP" list are ones pt was not taking PTA.  Discharge Diagnoses:  Principal Problem:   Alcohol withdrawal delirium (HCC) Active Problems:   Alcohol withdrawal (HCC)   Hypertensive urgency   Anxiety and depression   Tobacco abuse   Peripheral neuropathy   30 Day Unplanned Readmission Risk Score    Flowsheet Row ED to Hosp-Admission (Current) from 12/19/2023 in [facility] Emergency Department at [facility]  30 Day Unplanned Readmission Risk Score (%) 26.2 Filed at 12/21/2023 1200       This score is the patient's risk of an unplanned readmission within 30 days of being discharged (0 -100%). The score is based on dignosis, age, lab data, medications, orders,  and past utilization.   Low:  0-14.9   Medium: 15-21.9   High: 22-29.9   Extreme: 30 and above         Discharge Instructions:  Allergies as of 12/21/2023   No Known Allergies      Medication List     STOP taking these medications    escitalopram 20 MG tablet Commonly known as: Lexapro   gabapentin 300 MG capsule Commonly known as: NEURONTIN   hydrocortisone 1 % ointment   hydrOXYzine 10 MG tablet Commonly known as: ATARAX   melatonin 3 MG Tabs tablet   mometasone 0.1 % cream Commonly known as: ELOCON   Mucinex 600 MG 12 hr tablet Generic drug: guaiFENesin   Potassium Chloride ER 20 MEQ Tbcr   POTASSIUM PO       TAKE these medications    acamprosate 333 MG tablet Commonly known as: CAMPRAL Take 666 mg by mouth 3 (three) times daily.   FLUoxetine 40 MG capsule Commonly known as: PROZAC Take 40 mg by mouth every morning.   folic acid 1 MG tablet Commonly known as: FOLVITE Take 1 tablet (1 mg total) by mouth daily.   hydrALAZINE 50 MG tablet Commonly known as: APRESOLINE Take 1 tablet (50 mg total) by mouth 2 (two) times daily. If systolic BP greater than 150 mmHg.   losartan 50 MG tablet Commonly known as: COZAAR Take 1 tablet (50 mg total) by mouth daily.   Magnesium 400 MG Tabs Take 1 tablet by mouth 2 (two) times daily.   metoprolol succinate 25 MG 24 hr tablet Commonly known as: TOPROL-XL  Take 12.5 mg by mouth every morning.   multivitamin with minerals tablet Take 1 tablet by mouth daily.   thiamine 100 MG tablet Commonly known as: VITAMIN B1 Take 100 mg by mouth every morning.   traZODone 50 MG tablet Commonly known as: DESYREL Take 50-100 mg by mouth at bedtime as needed.   vitamin B-12 100 MCG tablet Commonly known as: CYANOCOBALAMIN Take 100 mcg by mouth daily.         Follow-up Information     Margarita Mail, DO Follow up in 1 week(s).   Specialty: Internal Medicine Contact information: [address] [place] [address] [phone]                 No Known Allergies   The results of significant diagnostics from this hospitalization (including imaging, microbiology, ancillary and laboratory) are listed below for reference.   Consultations:   Procedures/Studies: No results found.    Labs: BNP (last 3 results) No results for input(s): "BNP" in the last 8760 hours. Basic Metabolic Panel: Recent Labs  Lab 12/19/23 1831 12/20/23 0520  NA 139 138  K 3.5 3.0*  CL 101 103  CO2 18* 24  GLUCOSE 167* 113*  BUN 27* 24*  CREATININE 1.83* 1.30*  CALCIUM 9.3 8.3*   Liver Function Tests: Recent Labs  Lab 12/19/23 1831 12/20/23 0520  AST 69* 58*  ALT 34 25  ALKPHOS 148* 116  BILITOT 1.5* 1.6*  PROT 8.1 6.9  ALBUMIN 4.3 3.5   No results for input(s): "LIPASE", "AMYLASE" in the last 168 hours. No results for input(s): "AMMONIA" in the last 168 hours. CBC: Recent Labs  Lab 12/19/23 1831 12/20/23 0520  WBC 4.8 2.9*  HGB 13.2 11.4*  HCT 35.7* 31.2*  MCV 95.7 98.4  PLT 92* 73*   Cardiac Enzymes: No results for input(s): "CKTOTAL", "CKMB", "CKMBINDEX", "TROPONINI" in the last 168 hours. BNP: Invalid input(s): "POCBNP" CBG: No results for input(s): "GLUCAP" in the last 168 hours. D-Dimer No results for input(s): "DDIMER" in the last 72 hours. Hgb A1c No results for input(s): "HGBA1C" in the last 72 hours. Lipid Profile No results for input(s): "CHOL", "HDL", "LDLCALC", "TRIG", "CHOLHDL", "LDLDIRECT" in the last 72 hours. Thyroid function studies No results for input(s): "TSH", "T4TOTAL", "T3FREE", "THYROIDAB" in the last 72 hours.  Invalid input(s): "FREET3" Anemia work up No results for input(s): "VITAMINB12", "FOLATE", "FERRITIN", "TIBC", "IRON", "RETICCTPCT" in the last 72 hours. Urinalysis    Component Value Date/Time   COLORURINE YELLOW (A) 09/25/2023 0157   APPEARANCEUR CLEAR (A) 09/25/2023 0157   LABSPEC 1.015 09/25/2023  0157   PHURINE 7.0 09/25/2023 0157   GLUCOSEU 150 (A) 09/25/2023 0157   HGBUR NEGATIVE 09/25/2023 0157   BILIRUBINUR NEGATIVE 09/25/2023 0157   KETONESUR NEGATIVE 09/25/2023 0157   PROTEINUR NEGATIVE 09/25/2023 0157   NITRITE NEGATIVE 09/25/2023 0157   LEUKOCYTESUR NEGATIVE 09/25/2023 0157   Sepsis Labs Recent Labs  Lab 12/19/23 1831 12/20/23 0520  WBC 4.8 2.9*   Microbiology No results found for this or any previous visit (from the past 240 hours).   Total time spend on discharging this patient, including the last patient exam, discussing the hospital stay, instructions for ongoing care as it relates to all pertinent caregivers, as well as preparing the medical discharge records, prescriptions, and/or referrals as applicable, is 35 minutes.    Darlin Priestly, MD  [facility] 12/21/2023, 3:09 PM

## 2023-12-21 NOTE — ED Notes (Signed)
Pt up to the bathroom at this time. Gait steady. Standby assistance provided.

## 2023-12-21 NOTE — ED Notes (Signed)
Pt has pulled off all monitoring equipment.

## 2023-12-22 ENCOUNTER — Telehealth: Payer: Self-pay

## 2023-12-22 NOTE — Transitions of Care (Post Inpatient/ED Visit) (Signed)
   12/22/2023  Name: Diamante Rubin MRN: [id number] DOB: 04/30/1981  Today's TOC FU Call Status: Today's TOC FU Call Status:: Unsuccessful Call (1st Attempt) Unsuccessful Call (1st Attempt) Date: 12/22/23  Attempted to reach the patient regarding the most recent Inpatient/ED visit.  Follow Up Plan: Additional outreach attempts will be made to reach the patient to complete the Transitions of Care (Post Inpatient/ED visit) call.   Signature Karena Addison, LPN [facility] Nurse Health Advisor Direct Dial [phone]

## 2023-12-27 NOTE — Transitions of Care (Post Inpatient/ED Visit) (Signed)
   12/27/2023  Name: Prentis Langdon MRN: [id number] DOB: Mar 19, 1981  Today's TOC FU Call Status: Today's TOC FU Call Status:: Unsuccessful Call (3rd Attempt) Unsuccessful Call (1st Attempt) Date: 12/22/23 Unsuccessful Call (3rd Attempt) Date: 12/27/23  Attempted to reach the patient regarding the most recent Inpatient/ED visit.  Follow Up Plan: No further outreach attempts will be made at this time. We have been unable to contact the patient.  Signature Julian Lemmings, LPN [facility] Nurse Health Advisor Direct Dial [phone]

## 2024-01-02 ENCOUNTER — Inpatient Hospital Stay
Admission: EM | Admit: 2024-01-02 | Discharge: 2024-01-06 | DRG: 897 | Disposition: A | Discharge status: Home / Self Care | Payer: BLUE CROSS/BLUE SHIELD | Attending: Hospitalist | Admitting: Hospitalist

## 2024-01-02 ENCOUNTER — Other Ambulatory Visit: Payer: Self-pay

## 2024-01-02 DIAGNOSIS — F32A Depression, unspecified: Secondary | ICD-10-CM | POA: Diagnosis present

## 2024-01-02 DIAGNOSIS — Z8249 Family history of ischemic heart disease and other diseases of the circulatory system: Secondary | ICD-10-CM

## 2024-01-02 DIAGNOSIS — E876 Hypokalemia: Secondary | ICD-10-CM | POA: Diagnosis present

## 2024-01-02 DIAGNOSIS — K7010 Alcoholic hepatitis without ascites: Secondary | ICD-10-CM | POA: Diagnosis present

## 2024-01-02 DIAGNOSIS — Z634 Disappearance and death of family member: Secondary | ICD-10-CM

## 2024-01-02 DIAGNOSIS — Z823 Family history of stroke: Secondary | ICD-10-CM

## 2024-01-02 DIAGNOSIS — F10930 Alcohol use, unspecified with withdrawal, uncomplicated: Secondary | ICD-10-CM

## 2024-01-02 DIAGNOSIS — F10939 Alcohol use, unspecified with withdrawal, unspecified: Secondary | ICD-10-CM | POA: Diagnosis present

## 2024-01-02 DIAGNOSIS — F10931 Alcohol use, unspecified with withdrawal delirium: Principal | ICD-10-CM | POA: Diagnosis present

## 2024-01-02 DIAGNOSIS — Z87891 Personal history of nicotine dependence: Secondary | ICD-10-CM

## 2024-01-02 DIAGNOSIS — I10 Essential (primary) hypertension: Secondary | ICD-10-CM | POA: Diagnosis present

## 2024-01-02 DIAGNOSIS — F419 Anxiety disorder, unspecified: Secondary | ICD-10-CM | POA: Diagnosis present

## 2024-01-02 DIAGNOSIS — Z79899 Other long term (current) drug therapy: Secondary | ICD-10-CM

## 2024-01-02 DIAGNOSIS — F10231 Alcohol dependence with withdrawal delirium: Principal | ICD-10-CM | POA: Diagnosis present

## 2024-01-02 LAB — COMPREHENSIVE METABOLIC PANEL
ALT: 85 U/L — ABNORMAL HIGH (ref 0–44)
AST: 208 U/L — ABNORMAL HIGH (ref 15–41)
Albumin: 4.2 g/dL (ref 3.5–5.0)
Alkaline Phosphatase: 115 U/L (ref 38–126)
Anion gap: 20 — ABNORMAL HIGH (ref 5–15)
BUN: 15 mg/dL (ref 6–20)
CO2: 20 mmol/L — ABNORMAL LOW (ref 22–32)
Calcium: 8.1 mg/dL — ABNORMAL LOW (ref 8.9–10.3)
Chloride: 100 mmol/L (ref 98–111)
Creatinine, Ser: 1.21 mg/dL (ref 0.61–1.24)
GFR, Estimated: >60 mL/min (ref >60)
Glucose, Bld: 187 mg/dL — ABNORMAL HIGH (ref 70–99)
Potassium: 3.1 mmol/L — ABNORMAL LOW (ref 3.5–5.1)
Sodium: 140 mmol/L (ref 135–145)
Total Bilirubin: 2 mg/dL — ABNORMAL HIGH (ref 0.0–1.2)
Total Protein: 7.8 g/dL (ref 6.5–8.1)

## 2024-01-02 LAB — URINE DRUG SCREEN, QUALITATIVE (ARMC ONLY)
Amphetamines, Ur Screen: NOT DETECTED
Barbiturates, Ur Screen: POSITIVE — AB
Benzodiazepine, Ur Scrn: POSITIVE — AB
Cannabinoid 50 Ng, Ur ~~LOC~~: NOT DETECTED
Cocaine Metabolite,Ur ~~LOC~~: NOT DETECTED
MDMA (Ecstasy)Ur Screen: NOT DETECTED
Methadone Scn, Ur: NOT DETECTED
Opiate, Ur Screen: NOT DETECTED
Phencyclidine (PCP) Ur S: NOT DETECTED
Tricyclic, Ur Screen: NOT DETECTED

## 2024-01-02 LAB — CBC
HCT: 36 % — ABNORMAL LOW (ref 39.0–52.0)
Hemoglobin: 13 g/dL (ref 13.0–17.0)
MCH: 35.5 pg — ABNORMAL HIGH (ref 26.0–34.0)
MCHC: 36.1 g/dL — ABNORMAL HIGH (ref 30.0–36.0)
MCV: 98.4 fL (ref 80.0–100.0)
Platelets: 112 10*3/uL — ABNORMAL LOW (ref 150–400)
RBC: 3.66 MIL/uL — ABNORMAL LOW (ref 4.22–5.81)
RDW: 12.3 % (ref 11.5–15.5)
WBC: 4.8 10*3/uL (ref 4.0–10.5)
nRBC: 0 % (ref 0.0–0.2)

## 2024-01-02 LAB — ETHANOL: Alcohol, Ethyl (B): <10 mg/dL (ref <10)

## 2024-01-02 MED ORDER — LORAZEPAM 2 MG PO TABS
0.0000 mg | ORAL_TABLET | Freq: Four times a day (QID) | ORAL | Status: DC
  Administered 2024-01-03 (×2): 1 mg via ORAL
  Filled 2024-01-02 (×2): qty 1

## 2024-01-02 MED ORDER — LORAZEPAM 2 MG PO TABS
2.0000 mg | ORAL_TABLET | Freq: Once | ORAL | Status: AC
  Administered 2024-01-02: 2 mg via ORAL
  Filled 2024-01-02: qty 1

## 2024-01-02 MED ORDER — THIAMINE HCL 100 MG/ML IJ SOLN
100.0000 mg | Freq: Every day | INTRAMUSCULAR | Status: DC
  Administered 2024-01-02 – 2024-01-05 (×3): 100 mg via INTRAVENOUS
  Filled 2024-01-02 (×3): qty 2

## 2024-01-02 MED ORDER — LORAZEPAM 2 MG/ML IJ SOLN
0.0000 mg | Freq: Four times a day (QID) | INTRAMUSCULAR | Status: DC
  Administered 2024-01-02 – 2024-01-03 (×3): 2 mg via INTRAVENOUS
  Administered 2024-01-04: 1 mg via INTRAVENOUS
  Administered 2024-01-04: 3 mg via INTRAVENOUS
  Filled 2024-01-02 (×4): qty 1
  Filled 2024-01-02: qty 2

## 2024-01-02 MED ORDER — LORAZEPAM 2 MG/ML IJ SOLN
2.0000 mg | Freq: Once | INTRAMUSCULAR | Status: AC
  Administered 2024-01-02: 2 mg via INTRAVENOUS
  Filled 2024-01-02: qty 1

## 2024-01-02 MED ORDER — LORAZEPAM 2 MG/ML IJ SOLN
0.0000 mg | Freq: Two times a day (BID) | INTRAMUSCULAR | Status: DC
Start: 2024-01-05 — End: 2024-01-07

## 2024-01-02 MED ORDER — THIAMINE MONONITRATE 100 MG PO TABS
100.0000 mg | ORAL_TABLET | Freq: Every day | ORAL | Status: DC
  Administered 2024-01-03 – 2024-01-06 (×2): 100 mg via ORAL
  Filled 2024-01-02 (×3): qty 1

## 2024-01-02 MED ORDER — LORAZEPAM 2 MG PO TABS: 0.0000 mg | ORAL_TABLET | Freq: Two times a day (BID) | ORAL | Status: DC

## 2024-01-02 MED ORDER — SODIUM CHLORIDE 0.9 % IV BOLUS
1000.0000 mL | Freq: Once | INTRAVENOUS | Status: AC
  Administered 2024-01-02: 1000 mL via INTRAVENOUS

## 2024-01-02 MED ORDER — CHLORDIAZEPOXIDE HCL 10 MG PO CAPS: ORAL_CAPSULE | ORAL | 0 refills | Status: DC
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
## 2024-01-02 NOTE — ED Provider Notes (Signed)
 [facility] Provider Note    Event Date/Time   First MD Initiated Contact with Patient 01/02/24 1819     (approximate)  History   Chief Complaint: Withdrawal  HPI  Melvin Knight is a 43 y.o. male with a past medical history of hypertension, anxiety, alcohol abuse, who presents to the emergency department for withdrawal like symptoms.  According to the patient he typically drinks approximately 5 liquor drinks per day however since his mother passed away 3 days ago he has been drinking a significant amount more.  Patient states his last drink was approximately 20 hours ago.  On arrival patient noted to be hypertensive, with significant tremor.  Patient states a history of a prior seizure in the past from alcohol withdrawal.  States he does not know the last time he went over 24 hours without an alcoholic drink.  Physical Exam   Triage Vital Signs: ED Triage Vitals [01/02/24 1827]  Encounter Vitals Group     BP (!) 166/115     Systolic BP Percentile      Diastolic BP Percentile      Pulse Rate 90     Resp 20     Temp 98.5 F (36.9 C)     Temp src      SpO2 96 %     Weight 180 lb (81.6 kg)     Height 5\' 7"  (1.702 m)     Head Circumference      Peak Flow      Pain Score 0     Pain Loc      Pain Education      Exclude from Growth Chart     Most recent vital signs: Vitals:   01/02/24 1827 01/02/24 1915  BP: (!) 166/115 (!) 171/115  Pulse: 90 86  Resp: 20   Temp: 98.5 F (36.9 C)   SpO2: 96%     General: Awake, no distress.  CV:  Good peripheral perfusion.  Regular rate and rhythm  Resp:  Normal effort.  Equal breath sounds bilaterally.  Abd:  No distention.  Soft, nontender.  No rebound or guarding.  ED Results / Procedures / Treatments   EKG  I have reviewed and interpreted the EKG.  Shows normal sinus rhythm at 88 bpm the narrow QRS, normal axis, normal intervals, nonspecific ST changes.  Electrical interference likely due to the  patient's tremor.  MEDICATIONS ORDERED IN ED: Medications  LORazepam  (ATIVAN ) injection 0-4 mg (2 mg Intravenous Given 01/02/24 1919)    Or  LORazepam  (ATIVAN ) tablet 0-4 mg ( Oral See Alternative 01/02/24 1919)  LORazepam  (ATIVAN ) injection 0-4 mg (has no administration in time range)    Or  LORazepam  (ATIVAN ) tablet 0-4 mg (has no administration in time range)  thiamine  (VITAMIN B1) tablet 100 mg ( Oral See Alternative 01/02/24 1833)    Or  thiamine  (VITAMIN B1) injection 100 mg (100 mg Intravenous Given 01/02/24 1833)  LORazepam  (ATIVAN ) injection 2 mg (2 mg Intravenous Given 01/02/24 1833)  sodium chloride  0.9 % bolus 1,000 mL (1,000 mLs Intravenous New Bag/Given 01/02/24 1833)     IMPRESSION / MDM / ASSESSMENT AND PLAN / ED COURSE  I reviewed the triage vital signs and the nursing notes.  Patient's presentation is most consistent with acute presentation with potential threat to life or bodily function.  Patient presents to the emergency department alcohol withdrawal symptoms.  Patient has significant tremor, hypertensive.  States his last drink was approximately 20 hours  ago.  History of a prior withdrawal seizure as well.  Patient's lab work shows reassuring CBC with a normal white blood cell count.  Patient's chemistry does show LFT elevation likely due to chronic alcoholism as well as an anion gap of 20 possibly due to alcoholic ketoacidosis.  Alcohol level is negative today.  We will treat with IV Ativan , IV fluids.  I have ordered CIWA protocols for the patient.  Will continue to monitor in the emergency department.  Patient may require admission to the hospital service given his significant withdrawal history.  Patient is much more calm appearing after 2 rounds of IV Ativan .  I offered to admit the patient to the hospital given his significant withdrawal symptoms as well as withdrawal history of a seizure.  Patient states he needs to be discharged home as he needs to be home for his  mother's funeral arrangements.  Patient states he believes he is ready to stop drinking however.  I discussed with the patient a trial of Librium  at home I also had a long discussion with the patient that if he is to restart drinking he needs to discontinue that medication.  Patient is agreeable.  Patient unable to reach anyone to pick him up tonight.  Will monitor the patient in the emergency department on CIWA protocols.  With a plan to discharge home in the morning on a Librium  taper.  Patient agreeable to this.  FINAL CLINICAL IMPRESSION(S) / ED DIAGNOSES   Alcohol withdrawal Tremor  Note:  This document was prepared using Dragon voice recognition software and may include unintentional dictation errors.   Ruth Cove, MD 01/02/24 2244

## 2024-01-02 NOTE — Discharge Instructions (Addendum)
Please take your Librium as prescribed for the next 1 week.  As we discussed if you begin drinking alcohol please discontinue use of this medication.  Please call the number provided for [facility] if you need additional help or resources with stopping drinking.  Return to the emergency department for any seizure activity, significant withdrawal symptoms, or any other symptom personally concerning to yourself   Intensive Outpatient Programs   [facility] [address] [place],  [place], [place] [phone]  [facility] (Inpatient and outpatient)[phone] (Suboxone and Methadone) [address] [phone]  [facility]: [facility] Programs - Intensive Outpatient [address] [place], [place] [address]  [phone]  [facility] (Outpatient, Inpatient, [facility] (Groups and Residental) (insurance only) [phone] [place], [place] [phone]   [facility] (for caregivers and family) [address] Laurell Josephs [address] [place], [place] [phone]  Residential Treatment Programs  [facility](2 years) Residential: 43 days)[facility] [address] [place], [place], [place] [phone] or [phone]  [facility] [address] [place], [place], [place] [phone]  [facility] ([facility]) [address] [place], [place], [place] [phone] Admissions: 8am-3pm M-F  [facility] Program: Residential Program [phone] Days)              [facility]: [facility]  [place], [place], [place]  [phone] or [phone] in Hours over the weekend or by referral)  [facility] [address] [place], [place] [address] [phone] (Do virtual or phone assessment, offer transportation within 25 miles, have in patient and Outpatient options)   [facility] Crisis: [facility]:[phone] (for crisis response 24 hours a day)

## 2024-01-02 NOTE — ED Notes (Signed)
 Per Dr Azalee Bolds, give ativan  based on CIWA at 1900.

## 2024-01-03 ENCOUNTER — Encounter: Payer: Self-pay | Admitting: Internal Medicine

## 2024-01-03 DIAGNOSIS — F10931 Alcohol use, unspecified with withdrawal delirium: Secondary | ICD-10-CM | POA: Diagnosis present

## 2024-01-03 DIAGNOSIS — E876 Hypokalemia: Secondary | ICD-10-CM | POA: Diagnosis present

## 2024-01-03 DIAGNOSIS — F10231 Alcohol dependence with withdrawal delirium: Secondary | ICD-10-CM | POA: Diagnosis present

## 2024-01-03 DIAGNOSIS — F10939 Alcohol use, unspecified with withdrawal, unspecified: Secondary | ICD-10-CM | POA: Diagnosis not present

## 2024-01-03 DIAGNOSIS — Z87891 Personal history of nicotine dependence: Secondary | ICD-10-CM | POA: Diagnosis not present

## 2024-01-03 DIAGNOSIS — K7010 Alcoholic hepatitis without ascites: Secondary | ICD-10-CM | POA: Diagnosis present

## 2024-01-03 DIAGNOSIS — Z634 Disappearance and death of family member: Secondary | ICD-10-CM | POA: Diagnosis not present

## 2024-01-03 DIAGNOSIS — Z79899 Other long term (current) drug therapy: Secondary | ICD-10-CM | POA: Diagnosis not present

## 2024-01-03 DIAGNOSIS — Z8249 Family history of ischemic heart disease and other diseases of the circulatory system: Secondary | ICD-10-CM | POA: Diagnosis not present

## 2024-01-03 DIAGNOSIS — Z823 Family history of stroke: Secondary | ICD-10-CM | POA: Diagnosis not present

## 2024-01-03 DIAGNOSIS — I10 Essential (primary) hypertension: Secondary | ICD-10-CM | POA: Diagnosis present

## 2024-01-03 DIAGNOSIS — F419 Anxiety disorder, unspecified: Secondary | ICD-10-CM | POA: Diagnosis present

## 2024-01-03 DIAGNOSIS — F32A Depression, unspecified: Secondary | ICD-10-CM | POA: Diagnosis present

## 2024-01-03 LAB — MAGNESIUM
Magnesium: <0.5 mg/dL — CL (ref 1.7–2.4)
Magnesium: 1.4 mg/dL — ABNORMAL LOW (ref 1.7–2.4)

## 2024-01-03 LAB — PROTIME-INR
INR: 1 (ref 0.8–1.2)
Prothrombin Time: 13.8 s (ref 11.4–15.2)

## 2024-01-03 LAB — PHOSPHORUS: Phosphorus: 1.8 mg/dL — ABNORMAL LOW (ref 2.5–4.6)

## 2024-01-03 MED ORDER — LORAZEPAM 2 MG/ML IJ SOLN
1.0000 mg | INTRAMUSCULAR | Status: DC | PRN
  Administered 2024-01-04: 1 mg via INTRAVENOUS
  Filled 2024-01-03: qty 1

## 2024-01-03 MED ORDER — ACAMPROSATE CALCIUM 333 MG PO TBEC
666.0000 mg | DELAYED_RELEASE_TABLET | Freq: Three times a day (TID) | ORAL | Status: DC
  Administered 2024-01-03 – 2024-01-06 (×7): 666 mg via ORAL
  Filled 2024-01-03 (×13): qty 2

## 2024-01-03 MED ORDER — PHENOBARBITAL SODIUM 65 MG/ML IJ SOLN
65.0000 mg | Freq: Three times a day (TID) | INTRAMUSCULAR | Status: DC
  Administered 2024-01-05 – 2024-01-06 (×4): 65 mg via INTRAVENOUS
  Filled 2024-01-03 (×4): qty 1

## 2024-01-03 MED ORDER — ONDANSETRON HCL 4 MG/2ML IJ SOLN: 4.0000 mg | Freq: Four times a day (QID) | INTRAMUSCULAR | Status: DC | PRN

## 2024-01-03 MED ORDER — HYDRALAZINE HCL 20 MG/ML IJ SOLN
10.0000 mg | Freq: Four times a day (QID) | INTRAMUSCULAR | Status: DC | PRN
  Administered 2024-01-03 – 2024-01-05 (×4): 10 mg via INTRAVENOUS
  Filled 2024-01-03 (×4): qty 1

## 2024-01-03 MED ORDER — MAGNESIUM OXIDE -MG SUPPLEMENT 400 (240 MG) MG PO TABS
400.0000 mg | ORAL_TABLET | Freq: Two times a day (BID) | ORAL | Status: DC
  Administered 2024-01-03 – 2024-01-06 (×5): 400 mg via ORAL
  Filled 2024-01-03 (×6): qty 1

## 2024-01-03 MED ORDER — PHENOBARBITAL SODIUM 65 MG/ML IJ SOLN: 32.5000 mg | Freq: Three times a day (TID) | INTRAMUSCULAR | Status: DC

## 2024-01-03 MED ORDER — FLUOXETINE HCL 20 MG PO CAPS
40.0000 mg | ORAL_CAPSULE | Freq: Every morning | ORAL | Status: DC
  Administered 2024-01-03 – 2024-01-06 (×3): 40 mg via ORAL
  Filled 2024-01-03 (×4): qty 2

## 2024-01-03 MED ORDER — HYDRALAZINE HCL 50 MG PO TABS
50.0000 mg | ORAL_TABLET | Freq: Two times a day (BID) | ORAL | Status: DC
  Administered 2024-01-03 – 2024-01-06 (×4): 50 mg via ORAL
  Filled 2024-01-03 (×6): qty 1

## 2024-01-03 MED ORDER — LOSARTAN POTASSIUM 50 MG PO TABS
50.0000 mg | ORAL_TABLET | Freq: Every day | ORAL | Status: DC
  Administered 2024-01-03 – 2024-01-06 (×3): 50 mg via ORAL
  Filled 2024-01-03 (×4): qty 1

## 2024-01-03 MED ORDER — TRAZODONE HCL 50 MG PO TABS
50.0000 mg | ORAL_TABLET | Freq: Every evening | ORAL | Status: DC | PRN
  Administered 2024-01-04: 100 mg via ORAL
  Filled 2024-01-03: qty 2

## 2024-01-03 MED ORDER — PHENOBARBITAL SODIUM 130 MG/ML IJ SOLN
97.5000 mg | Freq: Three times a day (TID) | INTRAMUSCULAR | Status: AC
Start: 2024-01-03 — End: 2024-01-05
  Administered 2024-01-03 – 2024-01-04 (×5): 97.5 mg via INTRAVENOUS
  Filled 2024-01-03 (×5): qty 1

## 2024-01-03 MED ORDER — POTASSIUM & SODIUM PHOSPHATES 280-160-250 MG PO PACK
1.0000 | PACK | Freq: Three times a day (TID) | ORAL | Status: AC
  Administered 2024-01-03 (×3): 1 via ORAL
  Filled 2024-01-03 (×4): qty 1

## 2024-01-03 MED ORDER — POTASSIUM CHLORIDE CRYS ER 20 MEQ PO TBCR: 40.0000 meq | EXTENDED_RELEASE_TABLET | ORAL | Status: DC

## 2024-01-03 MED ORDER — LABETALOL HCL 5 MG/ML IV SOLN
20.0000 mg | INTRAVENOUS | Status: DC | PRN
  Administered 2024-01-03: 20 mg via INTRAVENOUS
  Filled 2024-01-03: qty 4

## 2024-01-03 MED ORDER — ONDANSETRON HCL 4 MG PO TABS: 4.0000 mg | ORAL_TABLET | Freq: Four times a day (QID) | ORAL | Status: DC | PRN

## 2024-01-03 MED ORDER — METOPROLOL SUCCINATE ER 25 MG PO TB24
12.5000 mg | ORAL_TABLET | Freq: Every morning | ORAL | Status: DC
Start: 2024-01-03 — End: 2024-01-06
  Administered 2024-01-03 – 2024-01-06 (×3): 12.5 mg via ORAL
  Filled 2024-01-03: qty 0.5
  Filled 2024-01-03 (×2): qty 1
  Filled 2024-01-03: qty 0.5

## 2024-01-03 MED ORDER — POTASSIUM CHLORIDE CRYS ER 20 MEQ PO TBCR
40.0000 meq | EXTENDED_RELEASE_TABLET | ORAL | Status: AC
  Administered 2024-01-03 (×2): 40 meq via ORAL
  Filled 2024-01-03 (×2): qty 2

## 2024-01-03 MED ORDER — MAGNESIUM SULFATE 4 GM/100ML IV SOLN
4.0000 g | Freq: Once | INTRAVENOUS | Status: AC
  Administered 2024-01-03: 4 g via INTRAVENOUS
  Filled 2024-01-03: qty 100

## 2024-01-03 MED ORDER — LORAZEPAM 1 MG PO TABS: 1.0000 mg | ORAL_TABLET | Freq: Three times a day (TID) | ORAL | Status: DC

## 2024-01-03 MED ORDER — MAGNESIUM SULFATE 4 GM/100ML IV SOLN
4.0000 g | INTRAVENOUS | Status: AC
  Administered 2024-01-03: 4 g via INTRAVENOUS
  Filled 2024-01-03 (×2): qty 100

## 2024-01-03 NOTE — ED Provider Notes (Signed)
-----------------------------------------   12:47 AM on 01/03/2024 -----------------------------------------   Patient remains hypertensive despite medication per CIWA.  He is not hallucinating.  Will administer IV Ativan.  Patient now consents to hospitalization.  Will consult hospital services for evaluation and admission.   Irean Hong, MD 01/03/24 [phone]

## 2024-01-03 NOTE — ED Notes (Signed)
Admitting MD at the bedside for pt evaluation.

## 2024-01-03 NOTE — ED Notes (Signed)
Patient continues to try and get out of bed. Writer and ED tech having to redirect behaviors. Patient is following directions.

## 2024-01-03 NOTE — Progress Notes (Signed)
Melvin Knight

## 2024-01-03 NOTE — ED Notes (Signed)
Pt seen attempting stand from stretcher. Pt asked where he is going and he states he is going to his moms funeral. Pt reminded that his family is waiting for him to get released and that he is not missing anything at this time. Pt verbalized understanding and returned to his bed.

## 2024-01-03 NOTE — ED Notes (Signed)
VOL medical admit

## 2024-01-03 NOTE — ED Notes (Signed)
Hospital meal provided, pt tolerated w/o complaints.  Waste discarded appropriately.

## 2024-01-03 NOTE — ED Notes (Signed)
Breakfast provided with orange juice

## 2024-01-03 NOTE — ED Notes (Signed)
Vol.Marland Kitchen

## 2024-01-03 NOTE — ED Notes (Signed)
Pt attempting to get out of bed. Redirected by nursing staff. Pt states "sundrop".

## 2024-01-03 NOTE — ED Notes (Signed)
Pt up requesting a shower. Shower supplies provided to pt along with a pair of blue scrubs, non slip socks and underwear.

## 2024-01-03 NOTE — ED Notes (Signed)
Pt seen standing next to stretcher with bleeding noted to left hand. Tape and Tegaderm partially removed and IV catheter no longer in hand. Small amount of blood noted to sheet on stretcher and on floor next to stretcher. Gauze applied and bleeding controlled. Pt denies pain at IV site and catheter intact upon removal.

## 2024-01-03 NOTE — ED Notes (Signed)
Patient wanted to go to restroom.  Patient was ambulated to restroom with Clinical research associate. Patient states, "I need to get out of here, my mom's funeral is Friday."  Writer reminded patient it was only Tuesday and he is currently at hospital due to ETOH withdraws and also reminded him of history of seizures due to withdraws.  Patient went to restroom, and then back to bed.

## 2024-01-03 NOTE — ED Notes (Addendum)
Patient has received multiple doses of ativan IV and one dose PO and one dose of Phenobarbital. Patient continues to be disoriented to place and time. Patient has tried to get out of bed and stating, "Can I not go speak with colleagues standing over there." No one would be in the direction patient was pointing to.  Patient is redirectable at this time.

## 2024-01-03 NOTE — H&P (Addendum)
History and Physical    Loney Domingo HYQ:[id number] DOB: [birth year] DOA: 01/02/2024  PCP: Margarita Mail, DO (Confirm with patient/family/NH records and if not entered, this has to be entered at [facility] point of entry) Patient coming from: Home  I have personally briefly reviewed patient's old medical records in [facility]  Chief Complaint: Feeling shaky  HPI: Melvin Knight is a 43 y.o. male with medical history significant of alcohol abuse, HTN, anxiety/depression presented with severe alcohol withdrawal symptoms.  Patient reported that he does binge drinking liquor heavily every day for hospitalist over the weekend.  Last drink was more than 24 hours ago then the patient had to stop pointing yesterday because of attending his mother's funeral. In the evening he started to have severe tremors and nauseous and unsteady gait and came to ED.  He denied any abdominal pain diarrhea fever chills.  Denied any suicidal ideation.  ED Course: Tachycardia, blood pressure significantly elevated and patient was found to be delirium tremens.  Blood work showed K3.1, creatinine 1.2, AST 208, ALT 85, glucose 97, WBC 4.8, hemoglobin 13, platelet 112.  Patient was given multiple dosage of Ativan in the ED, and started on phenobarbital tapering.  Review of Systems: As per HPI otherwise 14 point review of systems negative.    Past Medical History:  Diagnosis Date   AKI (acute kidney injury) (HCC) 09/24/2022   Anxiety    Hypertension    Hypokalemia    Hypokalemia 09/24/2022   Hypomagnesemia 09/24/2022    History reviewed. No pertinent surgical history.   reports that he has been smoking cigarettes. He has never used smokeless tobacco. He reports current alcohol use of about 6.0 standard drinks of alcohol per week. He reports that he does not currently use drugs.  No Known Allergies  Family History  Problem Relation Age of Onset   Hypertension Father    Stroke Father    Cancer Sister       Prior to Admission medications   Medication Sig Start Date End Date Taking? Authorizing Provider  acamprosate (CAMPRAL) 333 MG tablet Take 666 mg by mouth 3 (three) times daily. 12/01/23  Yes [provider]  chlordiazePOXIDE (LIBRIUM) 10 MG capsule Day 1-2: Take 1 tablet p.o. every 8 hours. Day 3-4: Take 1 tablet p.o. every 12 hours. Day 5-6: Take 1 tablet p.o. daily. Day 7: Stop 01/02/24  Yes Minna Antis, MD  FLUoxetine (PROZAC) 40 MG capsule Take 40 mg by mouth every morning. 10/17/23  Yes [provider]  folic acid (FOLVITE) 1 MG tablet Take 1 tablet (1 mg total) by mouth daily. 09/26/22  Yes Esaw Grandchild A, DO  hydrALAZINE (APRESOLINE) 50 MG tablet Take 1 tablet (50 mg total) by mouth 2 (two) times daily. If systolic BP greater than 150 mmHg. 10/06/23  Yes Margarita Mail, DO  losartan (COZAAR) 50 MG tablet Take 1 tablet (50 mg total) by mouth daily. 09/02/23 09/01/24 Yes Gillis Santa, MD  Magnesium 400 MG TABS Take 1 tablet by mouth 2 (two) times daily.   Yes [provider]  metoprolol succinate (TOPROL-XL) 25 MG 24 hr tablet Take 12.5 mg by mouth every morning. 06/04/23  Yes [provider]  Multiple Vitamins-Minerals (MULTIVITAMIN WITH MINERALS) tablet Take 1 tablet by mouth daily. 07/06/22  Yes Margarita Mail, DO  thiamine (VITAMIN B1) 100 MG tablet Take 100 mg by mouth every morning. 06/04/23  Yes [provider]  traZODone (DESYREL) 50 MG tablet Take 50-100 mg by mouth at  bedtime as needed. 06/04/23  Yes [provider]  vitamin B-12 (CYANOCOBALAMIN) 100 MCG tablet Take 100 mcg by mouth daily.   Yes [provider]    Physical Exam: Vitals:   01/03/24 0615 01/03/24 0630 01/03/24 0645 01/03/24 0833  BP: (!) 157/97 (!) 137/98  126/89  Pulse: 80 86 97 85  Resp:    18  Temp:    98.6 F (37 C)  TempSrc:    Oral  SpO2: 98% 96% 97% 97%  Weight:      Height:        Constitutional: NAD, calm,  comfortable Vitals:   01/03/24 0615 01/03/24 0630 01/03/24 0645 01/03/24 0833  BP: (!) 157/97 (!) 137/98  126/89  Pulse: 80 86 97 85  Resp:    18  Temp:    98.6 F (37 C)  TempSrc:    Oral  SpO2: 98% 96% 97% 97%  Weight:      Height:       Eyes: PERRL, lids and conjunctivae normal ENMT: Mucous membranes are moist. Posterior pharynx clear of any exudate or lesions.Normal dentition.  Neck: normal, supple, no masses, no thyromegaly Respiratory: clear to auscultation bilaterally, no wheezing, no crackles. Normal respiratory effort. No accessory muscle use.  Cardiovascular: Regular rate and rhythm, no murmurs / rubs / gallops. No extremity edema. 2+ pedal pulses. No carotid bruits.  Abdomen: no tenderness, no masses palpated. No hepatosplenomegaly. Bowel sounds positive.  Musculoskeletal: no clubbing / cyanosis. No joint deformity upper and lower extremities. Good ROM, no contractures. Normal muscle tone.  Skin: no rashes, lesions, ulcers. No induration Neurologic: CN 2-12 grossly intact. Sensation intact, DTR normal. Strength 5/5 in all 4. Tremors about 3 Hz on B/L hands and fingertips Psychiatric: Normal judgment and insight. Alert and oriented x 3. Normal mood.     Labs on Admission: I have personally reviewed following labs and imaging studies  CBC: Recent Labs  Lab 01/02/24 1829  WBC 4.8  HGB 13.0  HCT 36.0*  MCV 98.4  PLT 112*   Basic Metabolic Panel: Recent Labs  Lab 01/02/24 1829  NA 140  K 3.1*  CL 100  CO2 20*  GLUCOSE 187*  BUN 15  CREATININE 1.21  CALCIUM 8.1*   GFR: Estimated Creatinine Clearance: 83 mL/min (by C-G formula based on SCr of 1.21 mg/dL). Liver Function Tests: Recent Labs  Lab 01/02/24 1829  AST 208*  ALT 85*  ALKPHOS 115  BILITOT 2.0*  PROT 7.8  ALBUMIN 4.2   No results for input(s): "LIPASE", "AMYLASE" in the last 168 hours. No results for input(s): "AMMONIA" in the last 168 hours. Coagulation Profile: No results for input(s):  "INR", "PROTIME" in the last 168 hours. Cardiac Enzymes: No results for input(s): "CKTOTAL", "CKMB", "CKMBINDEX", "TROPONINI" in the last 168 hours. BNP (last 3 results) No results for input(s): "PROBNP" in the last 8760 hours. HbA1C: No results for input(s): "HGBA1C" in the last 72 hours. CBG: No results for input(s): "GLUCAP" in the last 168 hours. Lipid Profile: No results for input(s): "CHOL", "HDL", "LDLCALC", "TRIG", "CHOLHDL", "LDLDIRECT" in the last 72 hours. Thyroid Function Tests: No results for input(s): "TSH", "T4TOTAL", "FREET4", "T3FREE", "THYROIDAB" in the last 72 hours. Anemia Panel: No results for input(s): "VITAMINB12", "FOLATE", "FERRITIN", "TIBC", "IRON", "RETICCTPCT" in the last 72 hours. Urine analysis:    Component Value Date/Time   COLORURINE YELLOW (A) 09/25/2023 0157   APPEARANCEUR CLEAR (A) 09/25/2023 0157   LABSPEC 1.015 09/25/2023 0157   PHURINE 7.0  09/25/2023 0157   GLUCOSEU 150 (A) 09/25/2023 0157   HGBUR NEGATIVE 09/25/2023 0157   BILIRUBINUR NEGATIVE 09/25/2023 0157   KETONESUR NEGATIVE 09/25/2023 0157   PROTEINUR NEGATIVE 09/25/2023 0157   NITRITE NEGATIVE 09/25/2023 0157   LEUKOCYTESUR NEGATIVE 09/25/2023 0157    Radiological Exams on Admission: No results found.  EKG: None  Assessment/Plan Principal Problem:   Alcohol withdrawal (HCC) Active Problems:   Delirium tremens (HCC)  (please populate well all problems here in Problem List. (For example, if patient is on BP meds at home and you resume or decide to hold them, it is a problem that needs to be her. Same for CAD, COPD, HLD and so on)  Delirium tremens -No significant hallucination but patient has had frequent agitation and appears to be very restless.  Given that the patient has had severe withdrawal symptoms recently when Precedex drip had to be used to prevent withdrawal seizure, agreed with phenobarbital tapering and CIWA protocol.  Acute alcoholic hepatitis Acute  transaminitis -Secondary to our abuse, calculated discriminant factor less than 32, no indication for steroid.  Severe hypomagnesia -IV replacement  Severe hypophosphoremia  -PO replacement  Anxiety/depression -Denied any suicidal ideation -Continue SSRI  HTN -Continue losartan and hydralazine DVT prophylaxis: Lovenox Code Status: Full code Family Communication: None at bedside Disposition Plan: Patient is sick with severe alcohol withdrawal requiring phenobarbital tapering CIWA protocol-close monitoring for withdrawal seizure, expect more than 2 midnight hospital stay Consults called: None Admission status: PCU admit   Emeline General MD [facility] Pager [phone]  01/03/2024, 9:17 AM

## 2024-01-04 DIAGNOSIS — F10939 Alcohol use, unspecified with withdrawal, unspecified: Secondary | ICD-10-CM | POA: Diagnosis not present

## 2024-01-04 LAB — COMPREHENSIVE METABOLIC PANEL
ALT: 70 U/L — ABNORMAL HIGH (ref 0–44)
AST: 149 U/L — ABNORMAL HIGH (ref 15–41)
Albumin: 3.6 g/dL (ref 3.5–5.0)
Alkaline Phosphatase: 105 U/L (ref 38–126)
Anion gap: 13 (ref 5–15)
BUN: 14 mg/dL (ref 6–20)
CO2: 23 mmol/L (ref 22–32)
Calcium: 8.1 mg/dL — ABNORMAL LOW (ref 8.9–10.3)
Chloride: 104 mmol/L (ref 98–111)
Creatinine, Ser: 1.36 mg/dL — ABNORMAL HIGH (ref 0.61–1.24)
GFR, Estimated: >60 mL/min (ref >60)
Glucose, Bld: 95 mg/dL (ref 70–99)
Potassium: 3.2 mmol/L — ABNORMAL LOW (ref 3.5–5.1)
Sodium: 140 mmol/L (ref 135–145)
Total Bilirubin: 1.3 mg/dL — ABNORMAL HIGH (ref 0.0–1.2)
Total Protein: 7.1 g/dL (ref 6.5–8.1)

## 2024-01-04 LAB — PHOSPHORUS: Phosphorus: 1.7 mg/dL — ABNORMAL LOW (ref 2.5–4.6)

## 2024-01-04 LAB — CBC
HCT: 31.5 % — ABNORMAL LOW (ref 39.0–52.0)
Hemoglobin: 11.6 g/dL — ABNORMAL LOW (ref 13.0–17.0)
MCH: 35.5 pg — ABNORMAL HIGH (ref 26.0–34.0)
MCHC: 36.8 g/dL — ABNORMAL HIGH (ref 30.0–36.0)
MCV: 96.3 fL (ref 80.0–100.0)
Platelets: 86 10*3/uL — ABNORMAL LOW (ref 150–400)
RBC: 3.27 MIL/uL — ABNORMAL LOW (ref 4.22–5.81)
RDW: 12.4 % (ref 11.5–15.5)
WBC: 3.3 10*3/uL — ABNORMAL LOW (ref 4.0–10.5)
nRBC: 0 % (ref 0.0–0.2)

## 2024-01-04 LAB — MRSA NEXT GEN BY PCR, NASAL: MRSA by PCR Next Gen: NOT DETECTED

## 2024-01-04 LAB — GLUCOSE, CAPILLARY: Glucose-Capillary: 85 mg/dL (ref 70–99)

## 2024-01-04 LAB — MAGNESIUM: Magnesium: 2 mg/dL (ref 1.7–2.4)

## 2024-01-04 MED ORDER — LORAZEPAM 2 MG/ML IJ SOLN
0.0000 mg | INTRAMUSCULAR | Status: DC | PRN
  Administered 2024-01-04 (×2): 4 mg via INTRAVENOUS
  Administered 2024-01-05 (×2): 1 mg via INTRAVENOUS
  Administered 2024-01-05: 4 mg via INTRAVENOUS
  Filled 2024-01-04: qty 2
  Filled 2024-01-04 (×2): qty 1
  Filled 2024-01-04: qty 2

## 2024-01-04 MED ORDER — LORAZEPAM 2 MG/ML IJ SOLN: 2.0000 mg | Freq: Once | INTRAMUSCULAR | Status: DC

## 2024-01-04 MED ORDER — CHLORHEXIDINE GLUCONATE CLOTH 2 % EX PADS
6.0000 | MEDICATED_PAD | Freq: Every day | CUTANEOUS | Status: DC
  Administered 2024-01-04 – 2024-01-05 (×2): 6 via TOPICAL

## 2024-01-04 MED ORDER — ENOXAPARIN SODIUM 40 MG/0.4ML IJ SOSY
40.0000 mg | PREFILLED_SYRINGE | Freq: Every day | INTRAMUSCULAR | Status: DC
  Administered 2024-01-04 – 2024-01-05 (×2): 40 mg via SUBCUTANEOUS
  Filled 2024-01-04 (×2): qty 0.4

## 2024-01-04 MED ORDER — ADULT MULTIVITAMIN W/MINERALS CH
1.0000 | ORAL_TABLET | Freq: Every day | ORAL | Status: DC
  Administered 2024-01-05 – 2024-01-06 (×2): 1 via ORAL
  Filled 2024-01-04 (×2): qty 1

## 2024-01-04 MED ORDER — LORAZEPAM 2 MG PO TABS: 0.0000 mg | ORAL_TABLET | ORAL | Status: DC | PRN

## 2024-01-04 MED ORDER — DEXMEDETOMIDINE HCL IN NACL 200 MCG/50ML IV SOLN
0.0000 ug/kg/h | INTRAVENOUS | Status: DC
  Administered 2024-01-04: 0.5 ug/kg/h via INTRAVENOUS
  Administered 2024-01-04 (×2): 0.4 ug/kg/h via INTRAVENOUS
  Administered 2024-01-05: 0.7 ug/kg/h via INTRAVENOUS
  Filled 2024-01-04 (×5): qty 50

## 2024-01-04 MED ORDER — FOLIC ACID 1 MG PO TABS
1.0000 mg | ORAL_TABLET | Freq: Every day | ORAL | Status: DC
  Administered 2024-01-05 – 2024-01-06 (×2): 1 mg via ORAL
  Filled 2024-01-04 (×2): qty 1

## 2024-01-04 MED ORDER — POTASSIUM PHOSPHATES 15 MMOLE/5ML IV SOLN
30.0000 mmol | Freq: Once | INTRAVENOUS | Status: AC
  Administered 2024-01-04: 30 mmol via INTRAVENOUS
  Filled 2024-01-04: qty 10

## 2024-01-04 MED ORDER — LORAZEPAM 2 MG/ML IJ SOLN
INTRAMUSCULAR | Status: AC
  Administered 2024-01-04: 2 mg
  Filled 2024-01-04: qty 1

## 2024-01-04 MED ORDER — LORAZEPAM 2 MG/ML IJ SOLN
1.0000 mg | Freq: Once | INTRAMUSCULAR | Status: DC
  Filled 2024-01-04: qty 1

## 2024-01-04 MED ORDER — LORAZEPAM 2 MG/ML IJ SOLN
0.0000 mg | INTRAMUSCULAR | Status: DC
  Filled 2024-01-04: qty 2

## 2024-01-04 MED ORDER — LORAZEPAM 2 MG PO TABS: 0.0000 mg | ORAL_TABLET | ORAL | Status: DC

## 2024-01-04 NOTE — Significant Event (Signed)
Pt has received 7 mg of ativan , 97.5 mg of phenobarbital , had a tele sitter, the bed alarm on and floor mats. The pt has set off the bed alarm consistently despite medications given . MD notified . (See MAR). Most recent CIWA is 18.

## 2024-01-04 NOTE — Progress Notes (Signed)
  PROGRESS NOTE    Melvin Knight  ZOX:[id number] DOB: 09/13/1981 DOA: 01/02/2024 PCP: Margarita Mail, DO  [id number]  LOS: 1 day   Brief hospital course:   Assessment & Plan: Melvin Knight is a 43 y.o. male with medical history significant of alcohol abuse, HTN, anxiety/depression presented with severe alcohol withdrawal symptoms.    Severe Alcohol withdrawal --tremors, hallucinations, agitations, despite IV ativan and phenobarbital.   --Pt has had monthly admissions for alcohol withdrawal since Oct 2024, and needed precedex gtt during his Jan 2025 hospitalization. --start precedex gtt --cont phenobarbital with taper  Alcohol abuse and dependence --thiamine, folic acid   Acute alcoholic hepatitis Acute transaminitis -Secondary to our abuse, calculated discriminant factor less than 32, no indication for steroid.   Severe hypomagnesia --monitor and supplement PRN   Severe hypophosphoremia  --monitor and supplement PRN  Hypokalemia --monitor and supplement PRN   Anxiety/depression --cont Prozac   HTN -Continue losartan and hydralazine and Toprol   DVT prophylaxis: Lovenox SQ Code Status: Full code  Family Communication: sister updated at bedside today Level of care: ICU Dispo:   The patient is from: home Anticipated d/c is to: home Anticipated d/c date is: 1-2 days   Subjective and Interval History:  Pt was severely agitated this morning despite receiving IV ativan and phenobarbital, and was a danger to himself, so pt was started on precedex gtt and transferred to ICU.   Objective: Vitals:   01/04/24 1200 01/04/24 1300 01/04/24 1400 01/04/24 1500  BP: (!) 123/90 (!) 131/93 (!) 132/94 (!) 120/98  Pulse: 79 79 81 80  Resp: 20 18 18 16   Temp:      TempSrc:      SpO2: 93% 95% 94% 94%  Weight:      Height:        Intake/Output Summary (Last 24 hours) at 01/04/2024 1634 Last data filed at 01/04/2024 1500 Gross per 24 hour  Intake 405.76 ml  Output --   Net 405.76 ml   Filed Weights   01/02/24 1827 01/04/24 0910  Weight: 81.6 kg 83 kg    Examination:   Constitutional: NAD, sedated CV: No cyanosis.   RESP: normal respiratory effort, on RA SKIN: warm, dry   Data Reviewed: I have personally reviewed labs and imaging studies  Time spent: 50 minutes  Darlin Priestly, MD [facility] If 7PM-7AM, please contact night-coverage 01/04/2024, 4:34 PM

## 2024-01-05 DIAGNOSIS — F10939 Alcohol use, unspecified with withdrawal, unspecified: Secondary | ICD-10-CM | POA: Diagnosis not present

## 2024-01-05 LAB — CBC
HCT: 34.1 % — ABNORMAL LOW (ref 39.0–52.0)
Hemoglobin: 12.5 g/dL — ABNORMAL LOW (ref 13.0–17.0)
MCH: 35.9 pg — ABNORMAL HIGH (ref 26.0–34.0)
MCHC: 36.7 g/dL — ABNORMAL HIGH (ref 30.0–36.0)
MCV: 98 fL (ref 80.0–100.0)
Platelets: 83 10*3/uL — ABNORMAL LOW (ref 150–400)
RBC: 3.48 MIL/uL — ABNORMAL LOW (ref 4.22–5.81)
RDW: 12 % (ref 11.5–15.5)
WBC: 2.9 10*3/uL — ABNORMAL LOW (ref 4.0–10.5)
nRBC: 0 % (ref 0.0–0.2)

## 2024-01-05 LAB — BASIC METABOLIC PANEL
Anion gap: 12 (ref 5–15)
BUN: 13 mg/dL (ref 6–20)
CO2: 21 mmol/L — ABNORMAL LOW (ref 22–32)
Calcium: 7.8 mg/dL — ABNORMAL LOW (ref 8.9–10.3)
Chloride: 104 mmol/L (ref 98–111)
Creatinine, Ser: 0.96 mg/dL (ref 0.61–1.24)
GFR, Estimated: >60 mL/min (ref >60)
Glucose, Bld: 99 mg/dL (ref 70–99)
Potassium: 3.1 mmol/L — ABNORMAL LOW (ref 3.5–5.1)
Sodium: 137 mmol/L (ref 135–145)

## 2024-01-05 LAB — PHOSPHORUS: Phosphorus: 3.7 mg/dL (ref 2.5–4.6)

## 2024-01-05 LAB — MAGNESIUM: Magnesium: 1.4 mg/dL — ABNORMAL LOW (ref 1.7–2.4)

## 2024-01-05 MED ORDER — MAGNESIUM SULFATE 2 GM/50ML IV SOLN
2.0000 g | Freq: Once | INTRAVENOUS | Status: AC
  Administered 2024-01-05: 2 g via INTRAVENOUS
  Filled 2024-01-05: qty 50

## 2024-01-05 MED ORDER — POTASSIUM CHLORIDE CRYS ER 20 MEQ PO TBCR
40.0000 meq | EXTENDED_RELEASE_TABLET | Freq: Once | ORAL | Status: AC
  Administered 2024-01-05: 40 meq via ORAL
  Filled 2024-01-05: qty 2

## 2024-01-05 NOTE — Plan of Care (Signed)
  Problem: Education: Goal: Knowledge of General Education information will improve Description: Including pain rating scale, medication(s)/side effects and non-pharmacologic comfort measures Outcome: Progressing   Problem: Health Behavior/Discharge Planning: Goal: Ability to manage health-related needs will improve Outcome: Progressing   Problem: Clinical Measurements: Goal: Ability to maintain clinical measurements within normal limits will improve Outcome: Progressing Goal: Will remain free from infection Outcome: Progressing Goal: Diagnostic test results will improve Outcome: Progressing Goal: Respiratory complications will improve Outcome: Progressing Goal: Cardiovascular complication will be avoided Outcome: Progressing   Problem: Activity: Goal: Risk for activity intolerance will decrease Outcome: Progressing   Problem: Nutrition: Goal: Adequate nutrition will be maintained Outcome: Progressing   Problem: Coping: Goal: Level of anxiety will decrease Outcome: Progressing   Problem: Elimination: Goal: Will not experience complications related to bowel motility Outcome: Progressing Goal: Will not experience complications related to urinary retention Outcome: Progressing   Problem: Pain Managment: Goal: General experience of comfort will improve and/or be controlled Outcome: Progressing   Problem: Safety: Goal: Ability to remain free from injury will improve Outcome: Progressing   Problem: Skin Integrity: Goal: Risk for impaired skin integrity will decrease Outcome: Progressing   Problem: Education: Goal: Knowledge of disease or condition will improve Outcome: Progressing Goal: Understanding of discharge needs will improve Outcome: Progressing   Problem: Health Behavior/Discharge Planning: Goal: Ability to identify changes in lifestyle to reduce recurrence of condition will improve Outcome: Progressing Goal: Identification of resources available to  assist in meeting health care needs will improve Outcome: Progressing   Problem: Physical Regulation: Goal: Complications related to the disease process, condition or treatment will be avoided or minimized Outcome: Progressing   Problem: Safety: Goal: Ability to remain free from injury will improve Outcome: Progressing

## 2024-01-05 NOTE — Plan of Care (Signed)

## 2024-01-05 NOTE — Progress Notes (Addendum)
No Ativan given for CIWA 10 because scheduled phenobarbital is due and precedex gtt was increased. Will reassess after administration of phenobarb.

## 2024-01-05 NOTE — Progress Notes (Signed)
  PROGRESS NOTE    Melvin Knight  WUJ:[id number] DOB: 1981-06-01 DOA: 01/02/2024 PCP: Margarita Mail, DO  [id number]  LOS: 2 days   Brief hospital course:   Assessment & Plan: Melvin Knight is a 43 y.o. male with medical history significant of alcohol abuse, HTN, anxiety/depression presented with severe alcohol withdrawal symptoms.    Severe Alcohol withdrawal --tremors, hallucinations, agitations, despite IV ativan and phenobarbital.   --Pt has had monthly admissions for alcohol withdrawal since Oct 2024, and needed precedex gtt during his Jan 2025 hospitalization. --weaned off precedex gtt this morning --cont CIWA ativan and phenobarbital taper  Alcohol abuse and dependence --thiamine, folic acid   Acute alcoholic hepatitis Acute transaminitis -Secondary to our abuse, calculated discriminant factor less than 32, no indication for steroid.   Severe hypomagnesia --monitor and supplement PRN   Severe hypophosphoremia  --monitor and supplement PRN  Hypokalemia --monitor and supplement PRN   Anxiety/depression --cont Prozac   HTN -Continue losartan and hydralazine and Toprol   DVT prophylaxis: Lovenox SQ Code Status: Full code Pt is ok with sister being his decision maker. Family Communication:  Level of care: Med-Surg Dispo:   The patient is from: home Anticipated d/c is to: home Anticipated d/c date is: tomorrow   Subjective and Interval History:  Precedex gtt off his morning.  Pt appeared to have returned to his baseline mental status.   Objective: Vitals:   01/05/24 1200 01/05/24 1300 01/05/24 1400 01/05/24 1500  BP: 107/80 113/77  111/86  Pulse: (!) 109 (!) 110 (!) 103 99  Resp: (!) 21 (!) 22  (!) 22  Temp:      TempSrc:      SpO2: 95% 100% 96% 93%  Weight:      Height:        Intake/Output Summary (Last 24 hours) at 01/05/2024 1634 Last data filed at 01/05/2024 0800 Gross per 24 hour  Intake 803.08 ml  Output --  Net 803.08 ml   Filed  Weights   01/02/24 1827 01/04/24 0910  Weight: 81.6 kg 83 kg    Examination:   Constitutional: NAD, AAOx3, calm HEENT: conjunctivae and lids normal, EOMI CV: No cyanosis.   RESP: normal respiratory effort, on RA Neuro: II - XII grossly intact.   Psych: Normal mood and affect.     Data Reviewed: I have personally reviewed labs and imaging studies  Time spent: 35 minutes  Darlin Priestly, MD [facility] If 7PM-7AM, please contact night-coverage 01/05/2024, 4:34 PM

## 2024-01-06 ENCOUNTER — Other Ambulatory Visit: Payer: Self-pay

## 2024-01-06 DIAGNOSIS — F10939 Alcohol use, unspecified with withdrawal, unspecified: Secondary | ICD-10-CM | POA: Diagnosis not present

## 2024-01-06 LAB — BASIC METABOLIC PANEL
Anion gap: 10 (ref 5–15)
BUN: 19 mg/dL (ref 6–20)
CO2: 22 mmol/L (ref 22–32)
Calcium: 8.5 mg/dL — ABNORMAL LOW (ref 8.9–10.3)
Chloride: 104 mmol/L (ref 98–111)
Creatinine, Ser: 0.95 mg/dL (ref 0.61–1.24)
GFR, Estimated: >60 mL/min (ref >60)
Glucose, Bld: 92 mg/dL (ref 70–99)
Potassium: 3.5 mmol/L (ref 3.5–5.1)
Sodium: 136 mmol/L (ref 135–145)

## 2024-01-06 LAB — CBC
HCT: 32.6 % — ABNORMAL LOW (ref 39.0–52.0)
Hemoglobin: 12 g/dL — ABNORMAL LOW (ref 13.0–17.0)
MCH: 35.3 pg — ABNORMAL HIGH (ref 26.0–34.0)
MCHC: 36.8 g/dL — ABNORMAL HIGH (ref 30.0–36.0)
MCV: 95.9 fL (ref 80.0–100.0)
Platelets: 108 10*3/uL — ABNORMAL LOW (ref 150–400)
RBC: 3.4 MIL/uL — ABNORMAL LOW (ref 4.22–5.81)
RDW: 11.9 % (ref 11.5–15.5)
WBC: 3.6 10*3/uL — ABNORMAL LOW (ref 4.0–10.5)
nRBC: 0 % (ref 0.0–0.2)

## 2024-01-06 LAB — MAGNESIUM: Magnesium: 1.4 mg/dL — ABNORMAL LOW (ref 1.7–2.4)

## 2024-01-06 LAB — PHOSPHORUS: Phosphorus: 3.3 mg/dL (ref 2.5–4.6)

## 2024-01-06 MED ORDER — PHENOBARBITAL 30 MG PO TABS
ORAL_TABLET | ORAL | 0 refills | Status: DC
  Filled 2024-01-06 (×2): qty 10, 3d supply, fill #0

## 2024-01-06 MED ORDER — MAGNESIUM SULFATE 4 GM/100ML IV SOLN
4.0000 g | Freq: Once | INTRAVENOUS | Status: AC
  Administered 2024-01-06: 4 g via INTRAVENOUS
  Filled 2024-01-06: qty 100

## 2024-01-06 MED ORDER — PHENOBARBITAL 32.4 MG PO TABS
64.8000 mg | ORAL_TABLET | Freq: Three times a day (TID) | ORAL | Status: DC
  Administered 2024-01-06: 64.8 mg via ORAL
  Filled 2024-01-06: qty 2

## 2024-01-06 MED ORDER — PHENOBARBITAL 32.4 MG PO TABS: 32.4000 mg | ORAL_TABLET | Freq: Three times a day (TID) | ORAL | Status: DC

## 2024-01-06 MED ORDER — POTASSIUM CHLORIDE CRYS ER 20 MEQ PO TBCR
40.0000 meq | EXTENDED_RELEASE_TABLET | Freq: Once | ORAL | Status: AC
  Administered 2024-01-06: 40 meq via ORAL
  Filled 2024-01-06: qty 2

## 2024-01-06 NOTE — Discharge Summary (Signed)
Physician Discharge Summary   Bethel Gaglio  male DOB: 06/30/1981  ZOX:[id number]  PCP: Margarita Mail, DO  Admit date: 01/02/2024 Discharge date: 01/06/2024  Admitted From: home Disposition:  home CODE STATUS: Full code   Hospital Course:  For full details, please see H&P, progress notes, consult notes and ancillary notes.  Briefly,  Melvin Knight is a 43 y.o. male with medical history significant of alcohol abuse, recurrent hospitalizations for alcohol withdrawal, HTN, anxiety/depression presented with severe alcohol withdrawal symptoms.    Severe Alcohol withdrawal --Pt has had monthly admissions for alcohol withdrawal since Oct 2024, and needed precedex gtt during his Jan 2025 hospitalization. --tremors, hallucinations, agitations, despite IV ativan and phenobarbital, so was put on precedex gtt, which was weaned off the next day. --pt was free from withdrawal symptoms and back to baseline mental status prior to discharge.  Pt was discharged to finish phenobarbital taper.   Alcohol abuse and dependence --cont thiamine, folic acid --pt said he has alcohol cessation resources and had been to alcohol rehab facilities multiple times in the past.   Acute alcoholic hepatitis Acute transaminitis -Secondary to alcohol abuse   Hypokalemia Severe hypomagnesia Severe hypophosphoremia  --monitored and supplemented PRN   Anxiety/depression --cont Prozac   HTN -Continue losartan and hydralazine and Toprol   Discharge Diagnoses:  Principal Problem:   Alcohol withdrawal (HCC) Active Problems:   Delirium tremens (HCC)   30 Day Unplanned Readmission Risk Score    Flowsheet Row ED to Hosp-Admission (Current) from 01/02/2024 in [facility] 1C MEDICAL TELEMETRY  30 Day Unplanned Readmission Risk Score (%) 32.85 Filed at 01/06/2024 0801       This score is the patient's risk of an unplanned readmission within 30 days of being discharged (0 -100%). The  score is based on dignosis, age, lab data, medications, orders, and past utilization.   Low:  0-14.9   Medium: 15-21.9   High: 22-29.9   Extreme: 30 and above         Discharge Instructions:  Allergies as of 01/06/2024   No Known Allergies      Medication List     TAKE these medications    acamprosate 333 MG tablet Commonly known as: CAMPRAL Take 666 mg by mouth 3 (three) times daily.   chlordiazePOXIDE 10 MG capsule Commonly known as: LIBRIUM Day 1-2: Take 1 tablet p.o. every 8 hours. Day 3-4: Take 1 tablet p.o. every 12 hours. Day 5-6: Take 1 tablet p.o. daily. Day 7: Stop   FLUoxetine 40 MG capsule Commonly known as: PROZAC Take 40 mg by mouth every morning.   folic acid 1 MG tablet Commonly known as: FOLVITE Take 1 tablet (1 mg total) by mouth daily.   hydrALAZINE 50 MG tablet Commonly known as: APRESOLINE Take 1 tablet (50 mg total) by mouth 2 (two) times daily. If systolic BP greater than 150 mmHg.   losartan 50 MG tablet Commonly known as: COZAAR Take 1 tablet (50 mg total) by mouth daily.   Magnesium 400 MG Tabs Take 1 tablet by mouth 2 (two) times daily.   metoprolol succinate 25 MG 24 hr tablet Commonly known as: TOPROL-XL Take 12.5 mg by mouth every morning.   multivitamin with minerals tablet Take 1 tablet by mouth daily.   PHENObarbital 30 MG tablet Commonly known as: LUMINAL Take 2 tablets (60 mg total) by mouth 2 (two) times daily for 1 day, THEN 1 tablet (30 mg total) 3 (three) times daily for 2 days.  Start taking on: January 06, 2024   thiamine 100 MG tablet Commonly known as: VITAMIN B1 Take 100 mg by mouth every morning.   traZODone 50 MG tablet Commonly known as: DESYREL Take 50-100 mg by mouth at bedtime as needed.   vitamin B-12 100 MCG tablet Commonly known as: CYANOCOBALAMIN Take 100 mcg by mouth daily.         Follow-up Information     Margarita Mail, DO Follow up in 1 week(s).   Specialty: Internal  Medicine Contact information: [address] [place] [address] [phone]                 No Known Allergies   The results of significant diagnostics from this hospitalization (including imaging, microbiology, ancillary and laboratory) are listed below for reference.   Consultations:   Procedures/Studies: No results found.    Labs: BNP (last 3 results) No results for input(s): "BNP" in the last 8760 hours. Basic Metabolic Panel: Recent Labs  Lab 01/02/24 1829 01/03/24 1658 01/04/24 0255 01/05/24 0310 01/06/24 0538  NA 140  --  140 137 136  K 3.1*  --  3.2* 3.1* 3.5  CL 100  --  104 104 104  CO2 20*  --  23 21* 22  GLUCOSE 187*  --  95 99 92  BUN 15  --  14 13 19   CREATININE 1.21  --  1.36* 0.96 0.95  CALCIUM 8.1*  --  8.1* 7.8* 8.5*  MG <0.5* 1.4* 2.0 1.4* 1.4*  PHOS 1.8*  --  1.7* 3.7 3.3   Liver Function Tests: Recent Labs  Lab 01/02/24 1829 01/04/24 0255  AST 208* 149*  ALT 85* 70*  ALKPHOS 115 105  BILITOT 2.0* 1.3*  PROT 7.8 7.1  ALBUMIN 4.2 3.6   No results for input(s): "LIPASE", "AMYLASE" in the last 168 hours. No results for input(s): "AMMONIA" in the last 168 hours. CBC: Recent Labs  Lab 01/02/24 1829 01/04/24 0255 01/05/24 0310 01/06/24 0538  WBC 4.8 3.3* 2.9* 3.6*  HGB 13.0 11.6* 12.5* 12.0*  HCT 36.0* 31.5* 34.1* 32.6*  MCV 98.4 96.3 98.0 95.9  PLT 112* 86* 83* 108*   Cardiac Enzymes: No results for input(s): "CKTOTAL", "CKMB", "CKMBINDEX", "TROPONINI" in the last 168 hours. BNP: Invalid input(s): "POCBNP" CBG: Recent Labs  Lab 01/04/24 0904  GLUCAP 85   D-Dimer No results for input(s): "DDIMER" in the last 72 hours. Hgb A1c No results for input(s): "HGBA1C" in the last 72 hours. Lipid Profile No results for input(s): "CHOL", "HDL", "LDLCALC", "TRIG", "CHOLHDL", "LDLDIRECT" in the last 72 hours. Thyroid function studies No results for input(s): "TSH", "T4TOTAL", "T3FREE", "THYROIDAB" in  the last 72 hours.  Invalid input(s): "FREET3" Anemia work up No results for input(s): "VITAMINB12", "FOLATE", "FERRITIN", "TIBC", "IRON", "RETICCTPCT" in the last 72 hours. Urinalysis    Component Value Date/Time   COLORURINE YELLOW (A) 09/25/2023 0157   APPEARANCEUR CLEAR (A) 09/25/2023 0157   LABSPEC 1.015 09/25/2023 0157   PHURINE 7.0 09/25/2023 0157   GLUCOSEU 150 (A) 09/25/2023 0157   HGBUR NEGATIVE 09/25/2023 0157   BILIRUBINUR NEGATIVE 09/25/2023 0157   KETONESUR NEGATIVE 09/25/2023 0157   PROTEINUR NEGATIVE 09/25/2023 0157   NITRITE NEGATIVE 09/25/2023 0157   LEUKOCYTESUR NEGATIVE 09/25/2023 0157   Sepsis Labs Recent Labs  Lab 01/02/24 1829 01/04/24 0255 01/05/24 0310 01/06/24 0538  WBC 4.8 3.3* 2.9* 3.6*   Microbiology Recent Results (from the past 240 hours)  MRSA Next Gen by PCR,  Nasal     Status: None   Collection Time: 01/04/24  9:11 AM   Specimen: Nasal Mucosa; Nasal Swab  Result Value Ref Range Status   MRSA by PCR Next Gen NOT DETECTED NOT DETECTED Final    Comment: (NOTE) The GeneXpert MRSA Assay (FDA approved for NASAL specimens only), is one component of a comprehensive MRSA colonization surveillance program. It is not intended to diagnose MRSA infection nor to guide or monitor treatment for MRSA infections. Test performance is not FDA approved in patients less than 68 years old. Performed at [facility], [address] [place], [place] [address]      Total time spend on discharging this patient, including the last patient exam, discussing the hospital stay, instructions for ongoing care as it relates to all pertinent caregivers, as well as preparing the medical discharge records, prescriptions, and/or referrals as applicable, is 35 minutes.    Darlin Priestly, MD  [facility] 01/08/2024, 7:36 PM

## 2024-01-06 NOTE — Plan of Care (Signed)
Problem: Education: Goal: Knowledge of General Education information will improve Description: Including pain rating scale, medication(s)/side effects and non-pharmacologic comfort measures Outcome: Progressing   Problem: Clinical Measurements: Goal: Cardiovascular complication will be avoided Outcome: Progressing   Problem: Coping: Goal: Level of anxiety will decrease Outcome: Progressing   Problem: Safety: Goal: Ability to remain free from injury will improve Outcome: Progressing

## 2024-01-06 NOTE — Progress Notes (Signed)
Mobility Specialist - Progress Note   01/06/24 1026  Mobility  Activity Ambulated with assistance in hallway  Level of Assistance Standby assist, set-up cues, supervision of patient - no hands on  Assistive Device None  Distance Ambulated (ft) 320 ft  Activity Response Tolerated well  Mobility visit 1 Mobility  Mobility Specialist Start Time (ACUTE ONLY) 0956  Mobility Specialist Stop Time (ACUTE ONLY) 1001  Mobility Specialist Time Calculation (min) (ACUTE ONLY) 5 min   Pt supine upon entry, utilizing RA. Pt agreeable to OOB amb in the hallway this date. Pt completed bed mob ModI, STS and amb two lap around the NS with supervision-- no LOB or unsteadiness noted. Pt returned to the room, left supine with alarm set and needs within reach.  Zetta Bills Mobility Specialist 01/06/24 10:40 AM

## 2024-01-09 ENCOUNTER — Telehealth: Payer: Self-pay

## 2024-01-09 ENCOUNTER — Other Ambulatory Visit (HOSPITAL_BASED_OUTPATIENT_CLINIC_OR_DEPARTMENT_OTHER): Payer: Self-pay

## 2024-01-09 NOTE — Transitions of Care (Post Inpatient/ED Visit) (Unsigned)
   01/09/2024  Name: Melvin Knight MRN: [id number] DOB: February 14, 1981  Today's TOC FU Call Status: Today's TOC FU Call Status:: Unsuccessful Call (1st Attempt) Unsuccessful Call (1st Attempt) Date: 01/09/24  Attempted to reach the patient regarding the most recent Inpatient/ED visit.  Follow Up Plan: Additional outreach attempts will be made to reach the patient to complete the Transitions of Care (Post Inpatient/ED visit) call.   Signature Karena Addison, LPN [facility] Nurse Health Advisor Direct Dial [phone]

## 2024-01-10 NOTE — Transitions of Care (Post Inpatient/ED Visit) (Unsigned)
   01/10/2024  Name: Melvin Knight MRN: [id number] DOB: July 26, 1981  Today's TOC FU Call Status: Today's TOC FU Call Status:: Unsuccessful Call (2nd Attempt) Unsuccessful Call (1st Attempt) Date: 01/09/24 Unsuccessful Call (2nd Attempt) Date: 01/10/24  Attempted to reach the patient regarding the most recent Inpatient/ED visit.  Follow Up Plan: Additional outreach attempts will be made to reach the patient to complete the Transitions of Care (Post Inpatient/ED visit) call.   Signature Karena Addison, LPN [facility] Nurse Health Advisor Direct Dial [phone]

## 2024-01-11 NOTE — Transitions of Care (Post Inpatient/ED Visit) (Signed)
   01/11/2024  Name: Melvin Knight MRN: [id number] DOB: [birth year]  Today's TOC FU Call Status: Today's TOC FU Call Status:: Unsuccessful Call (3rd Attempt) Unsuccessful Call (1st Attempt) Date: 01/09/24 Unsuccessful Call (2nd Attempt) Date: 01/10/24 Unsuccessful Call (3rd Attempt) Date: 01/11/24  Attempted to reach the patient regarding the most recent Inpatient/ED visit.  Follow Up Plan: No further outreach attempts will be made at this time. We have been unable to contact the patient.  Signature Karena Addison, LPN [facility] Nurse Health Advisor Direct Dial [phone]

## 2024-01-14 ENCOUNTER — Observation Stay
Admission: EM | Admit: 2024-01-14 | Discharge: 2024-01-16 | Disposition: A | Discharge status: Home / Self Care | Payer: BLUE CROSS/BLUE SHIELD | Attending: Emergency Medicine | Admitting: Emergency Medicine

## 2024-01-14 ENCOUNTER — Other Ambulatory Visit: Payer: Self-pay

## 2024-01-14 DIAGNOSIS — K2920 Alcoholic gastritis without bleeding: Secondary | ICD-10-CM | POA: Diagnosis not present

## 2024-01-14 DIAGNOSIS — Z6828 Body mass index (BMI) 28.0-28.9, adult: Secondary | ICD-10-CM | POA: Insufficient documentation

## 2024-01-14 DIAGNOSIS — F1020 Alcohol dependence, uncomplicated: Secondary | ICD-10-CM | POA: Diagnosis present

## 2024-01-14 DIAGNOSIS — F17210 Nicotine dependence, cigarettes, uncomplicated: Secondary | ICD-10-CM | POA: Insufficient documentation

## 2024-01-14 DIAGNOSIS — E876 Hypokalemia: Secondary | ICD-10-CM | POA: Diagnosis not present

## 2024-01-14 DIAGNOSIS — F10230 Alcohol dependence with withdrawal, uncomplicated: Principal | ICD-10-CM | POA: Insufficient documentation

## 2024-01-14 DIAGNOSIS — F10939 Alcohol use, unspecified with withdrawal, unspecified: Secondary | ICD-10-CM | POA: Diagnosis not present

## 2024-01-14 DIAGNOSIS — E663 Overweight: Secondary | ICD-10-CM | POA: Insufficient documentation

## 2024-01-14 DIAGNOSIS — E871 Hypo-osmolality and hyponatremia: Secondary | ICD-10-CM | POA: Diagnosis not present

## 2024-01-14 DIAGNOSIS — I10 Essential (primary) hypertension: Secondary | ICD-10-CM | POA: Insufficient documentation

## 2024-01-14 DIAGNOSIS — Z79899 Other long term (current) drug therapy: Secondary | ICD-10-CM | POA: Insufficient documentation

## 2024-01-14 DIAGNOSIS — F10930 Alcohol use, unspecified with withdrawal, uncomplicated: Principal | ICD-10-CM

## 2024-01-14 HISTORY — DX: Alcohol abuse, uncomplicated: F10.10

## 2024-01-14 LAB — ETHANOL: Alcohol, Ethyl (B): <10 mg/dL (ref <10)

## 2024-01-14 LAB — CBC WITH DIFFERENTIAL/PLATELET
Abs Immature Granulocytes: 0.01 10*3/uL (ref 0.00–0.07)
Basophils Absolute: 0.1 10*3/uL (ref 0.0–0.1)
Basophils Relative: 3 %
Eosinophils Absolute: 0 10*3/uL (ref 0.0–0.5)
Eosinophils Relative: 1 %
HCT: 35.1 % — ABNORMAL LOW (ref 39.0–52.0)
Hemoglobin: 12.5 g/dL — ABNORMAL LOW (ref 13.0–17.0)
Immature Granulocytes: 0 %
Lymphocytes Relative: 20 %
Lymphs Abs: 0.9 10*3/uL (ref 0.7–4.0)
MCH: 35.1 pg — ABNORMAL HIGH (ref 26.0–34.0)
MCHC: 35.6 g/dL (ref 30.0–36.0)
MCV: 98.6 fL (ref 80.0–100.0)
Monocytes Absolute: 0.4 10*3/uL (ref 0.1–1.0)
Monocytes Relative: 10 %
Neutro Abs: 3 10*3/uL (ref 1.7–7.7)
Neutrophils Relative %: 66 %
Platelets: 253 10*3/uL (ref 150–400)
RBC: 3.56 MIL/uL — ABNORMAL LOW (ref 4.22–5.81)
RDW: 11.9 % (ref 11.5–15.5)
WBC: 4.5 10*3/uL (ref 4.0–10.5)
nRBC: 0 % (ref 0.0–0.2)

## 2024-01-14 LAB — COMPREHENSIVE METABOLIC PANEL
ALT: 84 U/L — ABNORMAL HIGH (ref 0–44)
AST: 92 U/L — ABNORMAL HIGH (ref 15–41)
Albumin: 4.1 g/dL (ref 3.5–5.0)
Alkaline Phosphatase: 101 U/L (ref 38–126)
Anion gap: 16 — ABNORMAL HIGH (ref 5–15)
BUN: 11 mg/dL (ref 6–20)
CO2: 24 mmol/L (ref 22–32)
Calcium: 9.3 mg/dL (ref 8.9–10.3)
Chloride: 100 mmol/L (ref 98–111)
Creatinine, Ser: 1.01 mg/dL (ref 0.61–1.24)
GFR, Estimated: >60 mL/min (ref >60)
Glucose, Bld: 108 mg/dL — ABNORMAL HIGH (ref 70–99)
Potassium: 3.9 mmol/L (ref 3.5–5.1)
Sodium: 140 mmol/L (ref 135–145)
Total Bilirubin: 1.1 mg/dL (ref 0.0–1.2)
Total Protein: 8 g/dL (ref 6.5–8.1)

## 2024-01-14 LAB — TROPONIN I (HIGH SENSITIVITY): Troponin I (High Sensitivity): 7 ng/L (ref <18)

## 2024-01-14 LAB — ACETAMINOPHEN LEVEL: Acetaminophen (Tylenol), Serum: <10 ug/mL — ABNORMAL LOW (ref 10–30)

## 2024-01-14 LAB — SALICYLATE LEVEL: Salicylate Lvl: <7 mg/dL — ABNORMAL LOW (ref 7.0–30.0)

## 2024-01-14 LAB — MAGNESIUM: Magnesium: 0.9 mg/dL — CL (ref 1.7–2.4)

## 2024-01-14 LAB — PHOSPHORUS: Phosphorus: 2.6 mg/dL (ref 2.5–4.6)

## 2024-01-14 MED ORDER — LORAZEPAM 2 MG/ML IJ SOLN
0.0000 mg | INTRAMUSCULAR | Status: AC
  Administered 2024-01-14: 1 mg via INTRAVENOUS
  Administered 2024-01-14: 2 mg via INTRAVENOUS
  Filled 2024-01-14 (×2): qty 1

## 2024-01-14 MED ORDER — LORAZEPAM 2 MG/ML IJ SOLN: 0.0000 mg | Freq: Three times a day (TID) | INTRAMUSCULAR | Status: DC

## 2024-01-14 MED ORDER — MAGNESIUM SULFATE 2 GM/50ML IV SOLN
2.0000 g | Freq: Once | INTRAVENOUS | Status: AC
  Administered 2024-01-14: 2 g via INTRAVENOUS
  Filled 2024-01-14: qty 50

## 2024-01-14 MED ORDER — LACTATED RINGERS IV BOLUS
1000.0000 mL | Freq: Once | INTRAVENOUS | Status: AC
  Administered 2024-01-14: 1000 mL via INTRAVENOUS

## 2024-01-14 MED ORDER — PHENOBARBITAL SODIUM 130 MG/ML IJ SOLN
130.0000 mg | Freq: Once | INTRAMUSCULAR | Status: AC
  Administered 2024-01-14: 130 mg via INTRAVENOUS
  Filled 2024-01-14: qty 1

## 2024-01-14 MED ORDER — PHENOBARBITAL SODIUM 130 MG/ML IJ SOLN: 130.0000 mg | Freq: Four times a day (QID) | INTRAMUSCULAR | Status: DC | PRN

## 2024-01-14 MED ORDER — HYDRALAZINE HCL 50 MG PO TABS
50.0000 mg | ORAL_TABLET | Freq: Two times a day (BID) | ORAL | Status: DC
  Administered 2024-01-14 – 2024-01-15 (×4): 50 mg via ORAL
  Filled 2024-01-14 (×4): qty 1

## 2024-01-14 MED ORDER — ADULT MULTIVITAMIN W/MINERALS CH
1.0000 | ORAL_TABLET | Freq: Every day | ORAL | Status: DC
  Administered 2024-01-14 – 2024-01-15 (×2): 1 via ORAL
  Filled 2024-01-14 (×2): qty 1

## 2024-01-14 MED ORDER — ACAMPROSATE CALCIUM 333 MG PO TBEC
666.0000 mg | DELAYED_RELEASE_TABLET | Freq: Three times a day (TID) | ORAL | Status: DC
  Administered 2024-01-14 – 2024-01-15 (×5): 666 mg via ORAL
  Filled 2024-01-14 (×7): qty 2

## 2024-01-14 MED ORDER — THIAMINE HCL 100 MG/ML IJ SOLN: 100.0000 mg | Freq: Every day | INTRAMUSCULAR | Status: DC

## 2024-01-14 MED ORDER — FLUOXETINE HCL 20 MG PO CAPS
40.0000 mg | ORAL_CAPSULE | Freq: Every morning | ORAL | Status: DC
  Administered 2024-01-14 – 2024-01-15 (×2): 40 mg via ORAL
  Filled 2024-01-14 (×2): qty 2

## 2024-01-14 MED ORDER — PHENOBARBITAL SODIUM 130 MG/ML IJ SOLN: 130.0000 mg | Freq: Two times a day (BID) | INTRAMUSCULAR | Status: DC

## 2024-01-14 MED ORDER — LABETALOL HCL 5 MG/ML IV SOLN
10.0000 mg | INTRAVENOUS | Status: DC | PRN
  Administered 2024-01-16: 10 mg via INTRAVENOUS
  Filled 2024-01-14: qty 4

## 2024-01-14 MED ORDER — METOPROLOL SUCCINATE ER 25 MG PO TB24
12.5000 mg | ORAL_TABLET | Freq: Every morning | ORAL | Status: DC
  Administered 2024-01-14 – 2024-01-15 (×2): 12.5 mg via ORAL
  Filled 2024-01-14 (×2): qty 1

## 2024-01-14 MED ORDER — ADULT MULTIVITAMIN W/MINERALS CH: 1.0000 | ORAL_TABLET | Freq: Every day | ORAL | Status: DC

## 2024-01-14 MED ORDER — TRAZODONE HCL 50 MG PO TABS
50.0000 mg | ORAL_TABLET | Freq: Every evening | ORAL | Status: DC | PRN
  Administered 2024-01-14 – 2024-01-16 (×2): 100 mg via ORAL
  Filled 2024-01-14 (×2): qty 2

## 2024-01-14 MED ORDER — LOSARTAN POTASSIUM 50 MG PO TABS
50.0000 mg | ORAL_TABLET | Freq: Every day | ORAL | Status: DC
  Administered 2024-01-14 – 2024-01-15 (×2): 50 mg via ORAL
  Filled 2024-01-14 (×2): qty 1

## 2024-01-14 MED ORDER — THIAMINE HCL 100 MG/ML IJ SOLN
100.0000 mg | Freq: Once | INTRAMUSCULAR | Status: AC
Start: 2024-01-14 — End: 2024-01-14
  Administered 2024-01-14: 100 mg via INTRAVENOUS
  Filled 2024-01-14: qty 2

## 2024-01-14 MED ORDER — THIAMINE MONONITRATE 100 MG PO TABS
100.0000 mg | ORAL_TABLET | Freq: Every day | ORAL | Status: DC
  Administered 2024-01-15: 100 mg via ORAL
  Filled 2024-01-14: qty 1

## 2024-01-14 MED ORDER — FOLIC ACID 1 MG PO TABS: 1.0000 mg | ORAL_TABLET | Freq: Every day | ORAL | Status: DC

## 2024-01-14 MED ORDER — FOLIC ACID 1 MG PO TABS
1.0000 mg | ORAL_TABLET | Freq: Every day | ORAL | Status: DC
Start: 2024-01-14 — End: 2024-01-16
  Administered 2024-01-14 – 2024-01-15 (×2): 1 mg via ORAL
  Filled 2024-01-14 (×2): qty 1

## 2024-01-14 NOTE — TOC CM/SW Note (Signed)
 TOC consult received for SA resources. SA resources added to the AVS.  Alfonso Ramus, LCSW Transitions of Care Department [phone]

## 2024-01-14 NOTE — ED Triage Notes (Signed)
 Per EMS, Pt c/o ETOH withdrawal, generalized abdominal pain, n/v, and tremors starting this morning.  Pain score 5/10.  Pt reports last drink yesterday morning.    Denies SI/HI/AVH.    Pt reports he has been under significant stress.  Sts his mother recently died, he lost his phone, and his TV stopped working.

## 2024-01-14 NOTE — ED Notes (Signed)
 Pt has been able to tolerate 80% of lunch meal.

## 2024-01-14 NOTE — ED Notes (Signed)
 CCMD made aware of patient.

## 2024-01-14 NOTE — H&P (Signed)
 History and Physical    Melvin Knight:[id number] DOB: [birth year] DOA: 01/14/2024  PCP: Margarita Mail, DO (Confirm with patient/family/NH records and if not entered, this has to be entered at [facility] point of entry) Patient coming from: Home  I have personally briefly reviewed patient's old medical records in [facility]  Chief Complaint: I need help  HPI: Melvin Knight is a 43 y.o. male with medical history significant of alcohol abuse, HTN, anxiety/depression presented with alcohol withdrawal symptoms.  Patient does binge drinking and last drink was the night before yesterday.  Yesterday morning, patient woke up with feeling of nausea and vomited several times of stomach content but no abdominal pain.  In the evening he started to have tremors and feeling anxious and came to ED.  Denies any chest pain for abdominal pain diarrhea.  In the ED patient was found to have borderline tachycardia, blood pressure elevated nonhypoxic afebrile.  Blood work showed magnesium 0.8, K3.9, glucose 108, BUN 11 creatinine 1.0 AST 92 ALT 84, total bilirubin 1.1.  Patient was given phenobarbital 130 mg IV x 2 in the ED and started on CIWA protocol.  Review of Systems: As per HPI otherwise 14 point review of systems negative.   Past Medical History:  Diagnosis Date   AKI (acute kidney injury) (HCC) 09/24/2022   Anxiety    ETOH abuse    Hypertension    Hypokalemia    Hypokalemia 09/24/2022   Hypomagnesemia 09/24/2022    History reviewed. No pertinent surgical history.   reports that he has been smoking cigarettes. He has never used smokeless tobacco. He reports current alcohol use of about 6.0 standard drinks of alcohol per week. He reports that he does not currently use drugs.  No Known Allergies  Family History  Problem Relation Age of Onset   Hypertension Father    Stroke Father    Cancer Sister    High transaminase ALT  Prior to Admission medications   Medication Sig Start Date  End Date Taking? Authorizing Provider  acamprosate (CAMPRAL) 333 MG tablet Take 666 mg by mouth 3 (three) times daily. 12/01/23  Yes [provider]  chlordiazePOXIDE (LIBRIUM) 10 MG capsule Day 1-2: Take 1 tablet p.o. every 8 hours. Day 3-4: Take 1 tablet p.o. every 12 hours. Day 5-6: Take 1 tablet p.o. daily. Day 7: Stop 01/02/24  Yes Minna Antis, MD  FLUoxetine (PROZAC) 40 MG capsule Take 40 mg by mouth every morning. 10/17/23  Yes [provider]  folic acid (FOLVITE) 1 MG tablet Take 1 tablet (1 mg total) by mouth daily. 09/26/22  Yes Esaw Grandchild A, DO  hydrALAZINE (APRESOLINE) 50 MG tablet Take 1 tablet (50 mg total) by mouth 2 (two) times daily. If systolic BP greater than 150 mmHg. 10/06/23  Yes Margarita Mail, DO  losartan (COZAAR) 50 MG tablet Take 1 tablet (50 mg total) by mouth daily. 09/02/23 09/01/24 Yes Gillis Santa, MD  Magnesium 400 MG TABS Take 1 tablet by mouth 2 (two) times daily.   Yes [provider]  metoprolol succinate (TOPROL-XL) 25 MG 24 hr tablet Take 12.5 mg by mouth every morning. 06/04/23  Yes [provider]  Multiple Vitamins-Minerals (MULTIVITAMIN WITH MINERALS) tablet Take 1 tablet by mouth daily. 07/06/22  Yes Margarita Mail, DO  thiamine (VITAMIN B1) 100 MG tablet Take 100 mg by mouth every morning. 06/04/23  Yes [provider]  traZODone (DESYREL) 50 MG tablet Take 50-100 mg by mouth at bedtime as needed. 06/04/23  Yes [provider]  vitamin B-12 (CYANOCOBALAMIN) 100 MCG tablet Take 100 mcg by mouth daily.   Yes [provider]  PHENObarbital (LUMINAL) 30 MG tablet Take 2 tablets (60 mg total) by mouth 2 (two) times daily for 1 day, THEN 1 tablet (30 mg total) 3 (three) times daily for 2 days. 01/06/24 01/09/24  Darlin Priestly, MD    Physical Exam: Vitals:   01/14/24 1304 01/14/24 1305 01/14/24 1310 01/14/24 1310  BP:  (!) 145/111  (!) 145/111  Pulse:  97 (!) 107 (!) 107  Resp:  (!) 21  (!) 23   Temp: 98.6 F (37 C)     TempSrc: Oral     SpO2:  99% 99%   Weight:      Height:        Constitutional: NAD, calm, comfortable Vitals:   01/14/24 1304 01/14/24 1305 01/14/24 1310 01/14/24 1310  BP:  (!) 145/111  (!) 145/111  Pulse:  97 (!) 107 (!) 107  Resp:  (!) 21 (!) 23   Temp: 98.6 F (37 C)     TempSrc: Oral     SpO2:  99% 99%   Weight:      Height:       Eyes: PERRL, lids and conjunctivae normal ENMT: Mucous membranes are moist. Posterior pharynx clear of any exudate or lesions.Normal dentition.  Neck: normal, supple, no masses, no thyromegaly Respiratory: clear to auscultation bilaterally, no wheezing, no crackles. Normal respiratory effort. No accessory muscle use.  Cardiovascular: Regular rate and rhythm, no murmurs / rubs / gallops. No extremity edema. 2+ pedal pulses. No carotid bruits.  Abdomen: no tenderness, no masses palpated. No hepatosplenomegaly. Bowel sounds positive.  Musculoskeletal: no clubbing / cyanosis. No joint deformity upper and lower extremities. Good ROM, no contractures. Normal muscle tone.  Coarse tremors on bilateral fingers and hands Skin: no rashes, lesions, ulcers. No induration Neurologic: CN 2-12 grossly intact. Sensation intact, DTR normal. Strength 5/5 in all 4.  Psychiatric: Normal judgment and insight. Alert and oriented x 3. Normal mood.     Labs on Admission: I have personally reviewed following labs and imaging studies  CBC: Recent Labs  Lab 01/14/24 0828  WBC 4.5  NEUTROABS 3.0  HGB 12.5*  HCT 35.1*  MCV 98.6  PLT 253   Basic Metabolic Panel: Recent Labs  Lab 01/14/24 0828  NA 140  K 3.9  CL 100  CO2 24  GLUCOSE 108*  BUN 11  CREATININE 1.01  CALCIUM 9.3  MG 0.9*   GFR: Estimated Creatinine Clearance: 100 mL/min (by C-G formula based on SCr of 1.01 mg/dL). Liver Function Tests: Recent Labs  Lab 01/14/24 0828  AST 92*  ALT 84*  ALKPHOS 101  BILITOT 1.1  PROT 8.0  ALBUMIN 4.1   No results  for input(s): "LIPASE", "AMYLASE" in the last 168 hours. No results for input(s): "AMMONIA" in the last 168 hours. Coagulation Profile: No results for input(s): "INR", "PROTIME" in the last 168 hours. Cardiac Enzymes: No results for input(s): "CKTOTAL", "CKMB", "CKMBINDEX", "TROPONINI" in the last 168 hours. BNP (last 3 results) No results for input(s): "PROBNP" in the last 8760 hours. HbA1C: No results for input(s): "HGBA1C" in the last 72 hours. CBG: No results for input(s): "GLUCAP" in the last 168 hours. Lipid Profile: No results for input(s): "CHOL", "HDL", "LDLCALC", "TRIG", "CHOLHDL", "LDLDIRECT" in the last 72 hours. Thyroid Function Tests: No results for input(s): "TSH", "T4TOTAL", "FREET4", "T3FREE", "THYROIDAB" in the last 72 hours. Anemia  Panel: No results for input(s): "VITAMINB12", "FOLATE", "FERRITIN", "TIBC", "IRON", "RETICCTPCT" in the last 72 hours. Urine analysis:    Component Value Date/Time   COLORURINE YELLOW (A) 09/25/2023 0157   APPEARANCEUR CLEAR (A) 09/25/2023 0157   LABSPEC 1.015 09/25/2023 0157   PHURINE 7.0 09/25/2023 0157   GLUCOSEU 150 (A) 09/25/2023 0157   HGBUR NEGATIVE 09/25/2023 0157   BILIRUBINUR NEGATIVE 09/25/2023 0157   KETONESUR NEGATIVE 09/25/2023 0157   PROTEINUR NEGATIVE 09/25/2023 0157   NITRITE NEGATIVE 09/25/2023 0157   LEUKOCYTESUR NEGATIVE 09/25/2023 0157    Radiological Exams on Admission: No results found.  EKG: Independently reviewed.  Sinus rhythm, no acute ST changes  Assessment/Plan Principal Problem:   Alcohol withdrawal (HCC) Active Problems:   Alcohol use disorder, moderate, dependence (HCC)  (please populate well all problems here in Problem List. (For example, if patient is on BP meds at home and you resume or decide to hold them, it is a problem that needs to be her. Same for CAD, COPD, HLD and so on)  Severe alcohol withdrawal -CIWA protocol -As needed phenobarbital -As needed haloperidol  Severe  hypomagnesemia -IV replacement, recheck level tomorrow -Check phosphorus level  Anxiety depression -Denied any suicidal ideation  HTN, uncontrolled -continue losartan and hydralazine    DVT prophylaxis: Lovenox Code Status: Full code Family Communication: None at bedside Disposition Plan: Expect less than 2 midnight hospital stay Consults called: None Admission status: PCU observation   Emeline General MD [facility] Pager [phone] 01/14/2024, 1:15 PM

## 2024-01-14 NOTE — Plan of Care (Signed)

## 2024-01-14 NOTE — ED Provider Notes (Signed)
 [facility] Provider Note    None    (approximate)   History   Withdrawal and Emesis   HPI  Melvin Knight is a 43 y.o. male with history of significant alcohol use, history of recurrent hospitalizations for alcohol withdrawal, hypertension, anxiety, depression, here with tremors.  Per EMS they were called because patient was having withdrawal symptoms, shaking, last drink was yesterday morning.  Has prior history of DTs in the past.  Patient denies any SI or HI.  Denies any fever cough, no congestion, no nausea, vomiting, diarrhea, urinary symptoms.  States no chest pain or shortness of breath at this time.  He denies any falls or trauma.  States he is not hallucinating.  Independent history obtained from EMS above.  On independent chart review he was admitted in mid February for severe alcohol withdrawal, was given multiple doses of Ativan in the ED and started on phenobarb.  Has had DTs in the past requiring being on a Precedex drip.     Physical Exam   Triage Vital Signs: ED Triage Vitals  Encounter Vitals Group     BP      Systolic BP Percentile      Diastolic BP Percentile      Pulse      Resp      Temp      Temp src      SpO2      Weight      Height      Head Circumference      Peak Flow      Pain Score      Pain Loc      Pain Education      Exclude from Growth Chart     Most recent vital signs: Vitals:   01/14/24 0900 01/14/24 0915  BP: (!) 163/123   Pulse: 85 87  Resp: 15 14  Temp:    SpO2: 100% 97%     General: Awake, no distress.  CV:  Good peripheral perfusion.  Tachycardic Resp:  Normal effort.  Tachypneic Abd:  No distention.  Soft nontender Other:  Tongue fasciculations with bilateral hand tremors.  Patient is alert, following commands.  No evidence of external trauma.  He was able to stand up and walk to the bed from the EMS stretcher with assistance.  No focal weakness or numbness.   ED Results / Procedures /  Treatments   Labs (all labs ordered are listed, but only abnormal results are displayed) Labs Reviewed  COMPREHENSIVE METABOLIC PANEL - Abnormal; Notable for the following components:      Result Value   Glucose, Bld 108 (*)    AST 92 (*)    ALT 84 (*)    Anion gap 16 (*)    All other components within normal limits  CBC WITH DIFFERENTIAL/PLATELET - Abnormal; Notable for the following components:   RBC 3.56 (*)    Hemoglobin 12.5 (*)    HCT 35.1 (*)    MCH 35.1 (*)    All other components within normal limits  ACETAMINOPHEN LEVEL - Abnormal; Notable for the following components:   Acetaminophen (Tylenol), Serum <10 (*)    All other components within normal limits  SALICYLATE LEVEL - Abnormal; Notable for the following components:   Salicylate Lvl <7.0 (*)    All other components within normal limits  ETHANOL  MAGNESIUM  TROPONIN I (HIGH SENSITIVITY)  TROPONIN I (HIGH SENSITIVITY)     EKG  EKG shows sinus rhythm, rate 95, normal QRS, normal QTc, baseline is wandering but no obvious ischemic ST elevation, T wave flattening's in aVL, T wave changes new compared to prior  PROCEDURES:  Critical Care performed: Yes, see critical care procedure note(s)  .Critical Care  Performed by: Claybon Jabs, MD Authorized by: Claybon Jabs, MD   Critical care provider statement:    Critical care time (minutes):  40   Critical care was necessary to treat or prevent imminent or life-threatening deterioration of the following conditions: Alcohol withdrawal.   Critical care was time spent personally by me on the following activities:  Development of treatment plan with patient or surrogate, discussions with consultants, evaluation of patient's response to treatment, examination of patient, ordering and review of laboratory studies, ordering and review of radiographic studies, ordering and performing treatments and interventions, pulse oximetry, re-evaluation of patient's condition and review of  old charts    MEDICATIONS ORDERED IN ED: Medications  folic acid (FOLVITE) tablet 1 mg (has no administration in time range)  multivitamin with minerals tablet 1 tablet (has no administration in time range)  PHENObarbital (LUMINAL) injection 130 mg (has no administration in time range)  lactated ringers bolus 1,000 mL (1,000 mLs Intravenous New Bag/Given 01/14/24 0838)  PHENObarbital (LUMINAL) injection 130 mg (130 mg Intravenous Given 01/14/24 0839)  thiamine (VITAMIN B1) injection 100 mg (100 mg Intravenous Given 01/14/24 0838)     IMPRESSION / MDM / ASSESSMENT AND PLAN / ED COURSE  I reviewed the triage vital signs and the nursing notes.                              Differential diagnosis includes, but is not limited to, alcohol withdrawal, no hallucinations or seizures at this time but will put him on a CIWA protocol.  Electrolyte derangements, dehydration, no evidence of external trauma.  Will get labs, EKG, troponin.  Given some IV fluids, thiamine, folic acid, phenobarbital.  Reassess.  Suspect he will need to be admitted for severe alcohol withdrawal.  Patient's presentation is most consistent with acute presentation with potential threat to life or bodily function.  Consulted hospitalist who will evaluate the patient.  He is admitted.  Clinical Course as of 01/14/24 2956  Sat Jan 14, 2024  0925 On reassessment patient's heart rate is down but he still hypertensive, he still has some tongue fasciculations and tremors.  Will give him another dose of phenobarbital and have plan to have him admitted. [TT]  [phone] Independent review of labs, electrolytes not severely deranged, he has mild LFT elevations but no jaundice or right upper quadrant abdominal pain, his troponin is not elevated, Tylenol, salicylate, ethanol levels are not elevated, no leukocytosis. [TT]  0950 His mag is 0.9.  Will put in for some IV mag. [TT]    Clinical Course User Index [TT] Jodie Echevaria Franchot Erichsen, MD     FINAL  CLINICAL IMPRESSION(S) / ED DIAGNOSES   Final diagnoses:  Alcohol withdrawal syndrome without complication (HCC)     Rx / DC Orders   ED Discharge Orders     None        Note:  This document was prepared using Dragon voice recognition software and may include unintentional dictation errors.    Claybon Jabs, MD 01/14/24 [phone]

## 2024-01-15 DIAGNOSIS — F10939 Alcohol use, unspecified with withdrawal, unspecified: Secondary | ICD-10-CM | POA: Diagnosis not present

## 2024-01-15 DIAGNOSIS — K2920 Alcoholic gastritis without bleeding: Secondary | ICD-10-CM | POA: Diagnosis not present

## 2024-01-15 LAB — MAGNESIUM: Magnesium: 1.9 mg/dL (ref 1.7–2.4)

## 2024-01-15 MED ORDER — ENOXAPARIN SODIUM 40 MG/0.4ML IJ SOSY
40.0000 mg | PREFILLED_SYRINGE | Freq: Every day | INTRAMUSCULAR | Status: DC
  Administered 2024-01-15: 40 mg via SUBCUTANEOUS
  Filled 2024-01-15: qty 0.4

## 2024-01-15 MED ORDER — MAGNESIUM CHLORIDE 64 MG PO TBEC
1.0000 | DELAYED_RELEASE_TABLET | Freq: Two times a day (BID) | ORAL | Status: DC
  Administered 2024-01-15 (×2): 64 mg via ORAL
  Filled 2024-01-15 (×4): qty 1

## 2024-01-15 MED ORDER — PANTOPRAZOLE SODIUM 40 MG PO TBEC
40.0000 mg | DELAYED_RELEASE_TABLET | Freq: Every day | ORAL | Status: DC
  Administered 2024-01-15: 40 mg via ORAL
  Filled 2024-01-15: qty 1

## 2024-01-15 NOTE — Progress Notes (Signed)
  Progress Note   Patient: Melvin Knight ZOX:[id number] DOB: [birth year] DOA: 01/14/2024     0 DOS: the patient was seen and examined on 01/15/2024   Brief hospital course: Melvin Knight is a 43 y.o. male with medical history significant of alcohol abuse, HTN, anxiety/depression presented with alcohol withdrawal symptoms.    Principal Problem:   Alcohol withdrawal (HCC) Active Problems:   Alcohol use disorder, moderate, dependence (HCC)   Hypokalemia   Hypomagnesemia   Alcoholic gastritis   Assessment and Plan: Alcohol withdrawal. Condition appear to be improving, patient last drinking was Monday.  Still has a tremor, no significant confusion.  Will continue CIWA protocol, another day, continue thiamine. Patient had a recurrent admission for alcohol withdrawal, cessation of alcohol strongly urged.  Alcohol gastritis with nausea vomiting. Hypomagnesemia  Hypokalemia. Electrolytes are normalized today, I will add oral magnesium chloride twice a day.  Recheck electrolytes tomorrow.  Essential hypertension Continue home medicines.  Overweight with BMI 28.51. Diet and excise.    Subjective:  Patient seems doing better today, tolerating diet without nausea vomiting.  Physical Exam: Vitals:   01/14/24 2332 01/15/24 0610 01/15/24 0808 01/15/24 0846  BP: (!) 151/102 (!) 135/103 (!) 147/102 (!) 147/102  Pulse: 76 81 71 71  Resp: 18 18 18    Temp: 98.1 F (36.7 C) 98 F (36.7 C) 98.3 F (36.8 C)   TempSrc: Oral Oral    SpO2: 98%  99%   Weight:      Height:       General exam: Appears calm and comfortable  Respiratory system: Clear to auscultation. Respiratory effort normal. Cardiovascular system: S1 & S2 heard, RRR. No JVD, murmurs, rubs, gallops or clicks. No pedal edema. Gastrointestinal system: Abdomen is nondistended, soft and nontender. No organomegaly or masses felt. Normal bowel sounds heard. Central nervous system: Alert and oriented x2. No focal neurological  deficits. Extremities: Symmetric 5 x 5 power. Skin: No rashes, lesions or ulcers Psychiatry: Judgement and insight appear normal. Mood & affect appropriate.    Data Reviewed:  Lab results reviewed.  Family Communication: None  Disposition: Status is: Observation      Time spent: 35 minutes  Author: Marrion Coy, MD 01/15/2024 11:23 AM  For on call review [email or web address].

## 2024-01-15 NOTE — Hospital Course (Signed)
 Melvin Knight is a 43 y.o. male with medical history significant of alcohol abuse, HTN, anxiety/depression presented with alcohol withdrawal symptoms.

## 2024-01-15 NOTE — Plan of Care (Signed)
  Problem: Education: Goal: Knowledge of General Education information will improve Description: Including pain rating scale, medication(s)/side effects and non-pharmacologic comfort measures Outcome: Progressing   Problem: Health Behavior/Discharge Planning: Goal: Ability to manage health-related needs will improve Outcome: Progressing   Problem: Clinical Measurements: Goal: Ability to maintain clinical measurements within normal limits will improve Outcome: Progressing Goal: Will remain free from infection Outcome: Progressing Goal: Diagnostic test results will improve Outcome: Progressing Goal: Respiratory complications will improve Outcome: Progressing   Problem: Activity: Goal: Risk for activity intolerance will decrease Outcome: Progressing   Problem: Nutrition: Goal: Adequate nutrition will be maintained Outcome: Progressing   Problem: Coping: Goal: Level of anxiety will decrease Outcome: Progressing   Problem: Pain Managment: Goal: General experience of comfort will improve and/or be controlled Outcome: Progressing   Problem: Safety: Goal: Ability to remain free from injury will improve Outcome: Progressing

## 2024-01-15 NOTE — Plan of Care (Signed)

## 2024-01-16 DIAGNOSIS — E876 Hypokalemia: Secondary | ICD-10-CM | POA: Diagnosis not present

## 2024-01-16 DIAGNOSIS — F10932 Alcohol use, unspecified with withdrawal with perceptual disturbance: Secondary | ICD-10-CM

## 2024-01-16 DIAGNOSIS — K2920 Alcoholic gastritis without bleeding: Secondary | ICD-10-CM | POA: Diagnosis not present

## 2024-01-16 LAB — PHOSPHORUS: Phosphorus: 4.3 mg/dL (ref 2.5–4.6)

## 2024-01-16 LAB — BASIC METABOLIC PANEL
Anion gap: 10 (ref 5–15)
BUN: 15 mg/dL (ref 6–20)
CO2: 22 mmol/L (ref 22–32)
Calcium: 9.4 mg/dL (ref 8.9–10.3)
Chloride: 101 mmol/L (ref 98–111)
Creatinine, Ser: 1.01 mg/dL (ref 0.61–1.24)
GFR, Estimated: >60 mL/min (ref >60)
Glucose, Bld: 108 mg/dL — ABNORMAL HIGH (ref 70–99)
Potassium: 3.7 mmol/L (ref 3.5–5.1)
Sodium: 134 mmol/L — ABNORMAL LOW (ref 135–145)

## 2024-01-16 LAB — MAGNESIUM: Magnesium: 1.8 mg/dL (ref 1.7–2.4)

## 2024-01-16 LAB — GLUCOSE, CAPILLARY: Glucose-Capillary: 183 mg/dL — ABNORMAL HIGH (ref 70–99)

## 2024-01-16 MED ORDER — PANTOPRAZOLE SODIUM 40 MG PO TBEC: 40.0000 mg | DELAYED_RELEASE_TABLET | Freq: Every day | ORAL | 0 refills | Status: DC

## 2024-01-16 NOTE — Plan of Care (Signed)
   Problem: Education: Goal: Knowledge of General Education information will improve Description Including pain rating scale, medication(s)/side effects and non-pharmacologic comfort measures Outcome: Progressing

## 2024-01-16 NOTE — Discharge Summary (Signed)
 Physician Discharge Summary   Patient: Melvin Knight MRN: [id number] DOB: 10/30/1981  Admit date:     01/14/2024  Discharge date: 01/16/24  Discharge Physician: Marrion Coy   PCP: Margarita Mail, DO   Recommendations at discharge:   Follow-up with PCP in 1 week.  Discharge Diagnoses: Principal Problem:   Alcohol withdrawal (HCC) Active Problems:   Alcohol use disorder, moderate, dependence (HCC)   Hypokalemia   Hypomagnesemia   Alcoholic gastritis Mild hyponatremia. Resolved Problems:   * No resolved hospital problems. *  Hospital Course: Melvin Knight is a 43 y.o. male with medical history significant of alcohol abuse, HTN, anxiety/depression presented with alcohol withdrawal symptoms.   Assessment and Plan:  Alcohol withdrawal. Patient last drinking was Monday.  With IV fluids and symptomatic treatment and as needed Ativan and thiamine.  Condition has improved, medically stable for discharge.    Alcohol gastritis with nausea vomiting. Hypomagnesemia  Hypokalemia. Electrolytes has normalized.  Continue Protonix for 2 weeks.   Essential hypertension Continue home medicines.   Overweight with BMI 28.51. Diet and excise.         Consultants: None Procedures performed: None  Disposition: Home Diet recommendation:  Discharge Diet Orders (From admission, onward)     Start     Ordered   01/16/24 0000  Diet - low sodium heart healthy        01/16/24 1011           Cardiac diet DISCHARGE MEDICATION: Allergies as of 01/16/2024   No Known Allergies      Medication List     STOP taking these medications    chlordiazePOXIDE 10 MG capsule Commonly known as: LIBRIUM   PHENObarbital 30 MG tablet Commonly known as: LUMINAL       TAKE these medications    acamprosate 333 MG tablet Commonly known as: CAMPRAL Take 666 mg by mouth 3 (three) times daily.   FLUoxetine 40 MG capsule Commonly known as: PROZAC Take 40 mg by mouth every morning.    folic acid 1 MG tablet Commonly known as: FOLVITE Take 1 tablet (1 mg total) by mouth daily.   hydrALAZINE 50 MG tablet Commonly known as: APRESOLINE Take 1 tablet (50 mg total) by mouth 2 (two) times daily. If systolic BP greater than 150 mmHg.   losartan 50 MG tablet Commonly known as: COZAAR Take 1 tablet (50 mg total) by mouth daily.   Magnesium 400 MG Tabs Take 1 tablet by mouth 2 (two) times daily.   metoprolol succinate 25 MG 24 hr tablet Commonly known as: TOPROL-XL Take 12.5 mg by mouth every morning.   multivitamin with minerals tablet Take 1 tablet by mouth daily.   pantoprazole 40 MG tablet Commonly known as: PROTONIX Take 1 tablet (40 mg total) by mouth daily.   thiamine 100 MG tablet Commonly known as: VITAMIN B1 Take 100 mg by mouth every morning.   traZODone 50 MG tablet Commonly known as: DESYREL Take 50-100 mg by mouth at bedtime as needed.   vitamin B-12 100 MCG tablet Commonly known as: CYANOCOBALAMIN Take 100 mcg by mouth daily.        Follow-up Information     Margarita Mail, DO Follow up in 1 week(s).   Specialty: Internal Medicine Contact information: [address] [place] [address] [phone]                Discharge Exam: Ceasar Mons Weights   01/14/24 0814  Weight: 82.6 kg   General  exam: Appears calm and comfortable  Respiratory system: Clear to auscultation. Respiratory effort normal. Cardiovascular system: S1 & S2 heard, RRR. No JVD, murmurs, rubs, gallops or clicks. No pedal edema. Gastrointestinal system: Abdomen is nondistended, soft and nontender. No organomegaly or masses felt. Normal bowel sounds heard. Central nervous system: Alert and oriented. No focal neurological deficits. Extremities: Symmetric 5 x 5 power. Skin: No rashes, lesions or ulcers Psychiatry: Judgement and insight appear normal. Mood & affect appropriate.    Condition at discharge: good  The results of significant  diagnostics from this hospitalization (including imaging, microbiology, ancillary and laboratory) are listed below for reference.   Imaging Studies: No results found.  Microbiology: Results for orders placed or performed during the hospital encounter of 01/02/24  MRSA Next Gen by PCR, Nasal     Status: None   Collection Time: 01/04/24  9:11 AM   Specimen: Nasal Mucosa; Nasal Swab  Result Value Ref Range Status   MRSA by PCR Next Gen NOT DETECTED NOT DETECTED Final    Comment: (NOTE) The GeneXpert MRSA Assay (FDA approved for NASAL specimens only), is one component of a comprehensive MRSA colonization surveillance program. It is not intended to diagnose MRSA infection nor to guide or monitor treatment for MRSA infections. Test performance is not FDA approved in patients less than 72 years old. Performed at [facility], [address] [place], [place] [address]     Labs: CBC: Recent Labs  Lab 01/14/24 0828  WBC 4.5  NEUTROABS 3.0  HGB 12.5*  HCT 35.1*  MCV 98.6  PLT 253   Basic Metabolic Panel: Recent Labs  Lab 01/14/24 0828 01/15/24 0607 01/16/24 0610  NA 140  --  134*  K 3.9  --  3.7  CL 100  --  101  CO2 24  --  22  GLUCOSE 108*  --  108*  BUN 11  --  15  CREATININE 1.01  --  1.01  CALCIUM 9.3  --  9.4  MG 0.9* 1.9 1.8  PHOS 2.6  --  4.3   Liver Function Tests: Recent Labs  Lab 01/14/24 0828  AST 92*  ALT 84*  ALKPHOS 101  BILITOT 1.1  PROT 8.0  ALBUMIN 4.1   CBG: Recent Labs  Lab 01/16/24 0900  GLUCAP 183*    Discharge time spent: greater than 30 minutes.  Signed: Marrion Coy, MD [facility] 01/16/2024

## 2024-01-17 LAB — GLUCOSE, CAPILLARY
Glucose-Capillary: 79 mg/dL (ref 70–99)
Glucose-Capillary: 187 mg/dL — ABNORMAL HIGH (ref 70–99)

## 2024-01-18 ENCOUNTER — Ambulatory Visit: Payer: Self-pay | Admitting: Internal Medicine

## 2024-01-18 ENCOUNTER — Ambulatory Visit (INDEPENDENT_AMBULATORY_CARE_PROVIDER_SITE_OTHER): Payer: BLUE CROSS/BLUE SHIELD | Admitting: Nurse Practitioner

## 2024-01-18 ENCOUNTER — Encounter: Payer: Self-pay | Admitting: Nurse Practitioner

## 2024-01-18 VITALS — BP 138/82 | HR 107 | Temp 98.4°F | Resp 16 | Ht 67.0 in | Wt 173.4 lb

## 2024-01-18 DIAGNOSIS — E871 Hypo-osmolality and hyponatremia: Secondary | ICD-10-CM

## 2024-01-18 DIAGNOSIS — Z09 Encounter for follow-up examination after completed treatment for conditions other than malignant neoplasm: Secondary | ICD-10-CM | POA: Diagnosis not present

## 2024-01-18 DIAGNOSIS — R55 Syncope and collapse: Secondary | ICD-10-CM

## 2024-01-18 DIAGNOSIS — F10932 Alcohol use, unspecified with withdrawal with perceptual disturbance: Secondary | ICD-10-CM

## 2024-01-18 DIAGNOSIS — E876 Hypokalemia: Secondary | ICD-10-CM | POA: Diagnosis not present

## 2024-01-18 LAB — GLUCOSE, CAPILLARY: Glucose-Capillary: 196 mg/dL — ABNORMAL HIGH (ref 70–99)

## 2024-01-18 NOTE — Assessment & Plan Note (Signed)
 patient reports feeling much better. rechecking labs

## 2024-01-18 NOTE — Telephone Encounter (Signed)
 FYI

## 2024-01-18 NOTE — Progress Notes (Signed)
 BP 138/82   Pulse (!) 107   Temp 98.4 F (36.9 C)   Resp 16   Ht 5\' 7"  (1.702 m)   Wt 173 lb 6.4 oz (78.7 kg)   SpO2 97%   BMI 27.16 kg/m    Subjective:    Patient ID: Melvin Knight, male    DOB: October 07, 1981, 43 y.o.   MRN: [id number]  HPI: Melvin Knight is a 43 y.o. male  Chief Complaint  Patient presents with   Hospitalization Follow-up   Loss of Consciousness   Hospital follow up/ alcohol withdrawal, hypokalemia, hypomagnesemia, hyponatremia: [place] was admitted to [facility] for alcohol withdrawal. Patient was given phenobarbital and started on CIWA protocol. Patient mag level was  0.9. Patient was given iv magnesium and level was back in normal range 1.8. Liver enzymes were elevated.  patient reports that he is feeling fine.  He does report that he had a syncopal episode today while he was sitting in the yard. He denies any trauma.  Neuro exam is within normal limits.  Denies any chest pain, shortness of breath, headache or dizziness.  Admitted on : 01/14/2024 Discharged on: 01/16/2024 Last drink: today, patient was intoxicated at office visit Med rec completed Currently taking : CAMPRAL Take 666 mg by mouth 3 (three) times daily, thiamine, B12 and folic acid.  Discussed with patient the importance of alcohol cessation.    Recommendations at discharge:    Follow-up with PCP in 1 week.   Discharge Diagnoses: Principal Problem:   Alcohol withdrawal (HCC) Active Problems:   Alcohol use disorder, moderate, dependence (HCC)   Hypokalemia   Hypomagnesemia   Alcoholic gastritis Mild hyponatremia. Resolved Problems:   * No resolved hospital problems. *   Hospital Course: Melvin Knight is a 43 y.o. male with medical history significant of alcohol abuse, HTN, anxiety/depression presented with alcohol withdrawal symptoms.    Assessment and Plan:   Alcohol withdrawal. Patient last drinking was Monday.  With IV fluids and symptomatic treatment and as needed Ativan and thiamine.   Condition has improved, medically stable for discharge.     Alcohol gastritis with nausea vomiting. Hypomagnesemia  Hypokalemia. Electrolytes has normalized.  Continue Protonix for 2 weeks.   Essential hypertension Continue home medicines.   Overweight with BMI 28.51. Diet and excise.     Consultants: None Procedures performed: None  Disposition: Home Diet recommendation:  Discharge Diet Orders (From admission, onward)        Start     Ordered    01/16/24 0000   Diet - low sodium heart healthy        01/16/24 1011                Cardiac diet DISCHARGE MEDICATION: Allergies as of 01/16/2024   No Known Allergies         Medication List       STOP taking these medications     chlordiazePOXIDE 10 MG capsule Commonly known as: LIBRIUM    PHENObarbital 30 MG tablet Commonly known as: LUMINAL           TAKE these medications     acamprosate 333 MG tablet Commonly known as: CAMPRAL Take 666 mg by mouth 3 (three) times daily.    FLUoxetine 40 MG capsule Commonly known as: PROZAC Take 40 mg by mouth every morning.    folic acid 1 MG tablet Commonly known as: FOLVITE Take 1 tablet (1 mg total) by mouth daily.    hydrALAZINE 50 MG  tablet Commonly known as: APRESOLINE Take 1 tablet (50 mg total) by mouth 2 (two) times daily. If systolic BP greater than 150 mmHg.    losartan 50 MG tablet Commonly known as: COZAAR Take 1 tablet (50 mg total) by mouth daily.    Magnesium 400 MG Tabs Take 1 tablet by mouth 2 (two) times daily.    metoprolol succinate 25 MG 24 hr tablet Commonly known as: TOPROL-XL Take 12.5 mg by mouth every morning.    multivitamin with minerals tablet Take 1 tablet by mouth daily.    pantoprazole 40 MG tablet Commonly known as: PROTONIX Take 1 tablet (40 mg total) by mouth daily.    thiamine 100 MG tablet Commonly known as: VITAMIN B1 Take 100 mg by mouth every morning.    traZODone 50 MG tablet Commonly known as:  DESYREL Take 50-100 mg by mouth at bedtime as needed.    vitamin B-12 100 MCG tablet Commonly known as: CYANOCOBALAMIN Take 100 mcg by mouth daily.         10/06/2023    3:01 PM 09/06/2023   10:53 AM 07/04/2023   12:46 PM  Depression screen PHQ 2/9  Decreased Interest 0 1 0  Down, Depressed, Hopeless 0 0 0  PHQ - 2 Score 0 1 0  Altered sleeping 0 0 0  Tired, decreased energy 0 0 0  Change in appetite 0 0 0  Feeling bad or failure about yourself  0 0 0  Trouble concentrating 0 0 0  Moving slowly or fidgety/restless 0 0 0  Suicidal thoughts 0 0 0  PHQ-9 Score 0 1 0  Difficult doing work/chores Not difficult at all Not difficult at all Not difficult at all    Relevant past medical, surgical, family and social history reviewed and updated as indicated. Interim medical history since our last visit reviewed. Allergies and medications reviewed and updated.  Review of Systems  Constitutional: Negative for fever or weight change.  Respiratory: Negative for cough and shortness of breath.   Cardiovascular: Negative for chest pain or palpitations.  Gastrointestinal: Negative for abdominal pain, no bowel changes.  Musculoskeletal: Negative for gait problem or joint swelling.  Skin: Negative for rash.  Neurological: Negative for dizziness or headache.  No other specific complaints in a complete review of systems (except as listed in HPI above).      Objective:    BP 138/82   Pulse (!) 107   Temp 98.4 F (36.9 C)   Resp 16   Ht 5\' 7"  (1.702 m)   Wt 173 lb 6.4 oz (78.7 kg)   SpO2 97%   BMI 27.16 kg/m    Wt Readings from Last 3 Encounters:  01/18/24 173 lb 6.4 oz (78.7 kg)  01/14/24 182 lb (82.6 kg)  01/04/24 182 lb 15.7 oz (83 kg)    Physical Exam Vitals reviewed.  Constitutional:      Appearance: Normal appearance.  HENT:     Head: Normocephalic.  Cardiovascular:     Rate and Rhythm: Normal rate and regular rhythm.  Pulmonary:     Effort: Pulmonary effort is  normal.     Breath sounds: Normal breath sounds.  Musculoskeletal:        General: Normal range of motion.  Skin:    General: Skin is warm and dry.  Neurological:     General: No focal deficit present.     Mental Status: He is alert and oriented to person, place, and time. Mental status is  at baseline.  Psychiatric:        Mood and Affect: Mood normal.        Behavior: Behavior normal.        Thought Content: Thought content normal.        Judgment: Judgment normal.    Results for orders placed or performed during the hospital encounter of 01/14/24  Comprehensive metabolic panel   Collection Time: 01/14/24  8:28 AM  Result Value Ref Range   Sodium 140 135 - 145 mmol/L   Potassium 3.9 3.5 - 5.1 mmol/L   Chloride 100 98 - 111 mmol/L   CO2 24 22 - 32 mmol/L   Glucose, Bld 108 (H) 70 - 99 mg/dL   BUN 11 6 - 20 mg/dL   Creatinine, Ser 1.32 0.61 - 1.24 mg/dL   Calcium 9.3 8.9 - 44.0 mg/dL   Total Protein 8.0 6.5 - 8.1 g/dL   Albumin 4.1 3.5 - 5.0 g/dL   AST 92 (H) 15 - 41 U/L   ALT 84 (H) 0 - 44 U/L   Alkaline Phosphatase 101 38 - 126 U/L   Total Bilirubin 1.1 0.0 - 1.2 mg/dL   GFR, Estimated >10 >27 mL/min   Anion gap 16 (H) 5 - 15  Ethanol   Collection Time: 01/14/24  8:28 AM  Result Value Ref Range   Alcohol, Ethyl (B) <10 <10 mg/dL  CBC with Differential   Collection Time: 01/14/24  8:28 AM  Result Value Ref Range   WBC 4.5 4.0 - 10.5 K/uL   RBC 3.56 (L) 4.22 - 5.81 MIL/uL   Hemoglobin 12.5 (L) 13.0 - 17.0 g/dL   HCT 25.3 (L) 66.4 - 40.3 %   MCV 98.6 80.0 - 100.0 fL   MCH 35.1 (H) 26.0 - 34.0 pg   MCHC 35.6 30.0 - 36.0 g/dL   RDW 47.4 25.9 - 56.3 %   Platelets 253 150 - 400 K/uL   nRBC 0.0 0.0 - 0.2 %   Neutrophils Relative % 66 %   Neutro Abs 3.0 1.7 - 7.7 K/uL   Lymphocytes Relative 20 %   Lymphs Abs 0.9 0.7 - 4.0 K/uL   Monocytes Relative 10 %   Monocytes Absolute 0.4 0.1 - 1.0 K/uL   Eosinophils Relative 1 %   Eosinophils Absolute 0.0 0.0 - 0.5 K/uL    Basophils Relative 3 %   Basophils Absolute 0.1 0.0 - 0.1 K/uL   Immature Granulocytes 0 %   Abs Immature Granulocytes 0.01 0.00 - 0.07 K/uL  Acetaminophen level   Collection Time: 01/14/24  8:28 AM  Result Value Ref Range   Acetaminophen (Tylenol), Serum <10 (L) 10 - 30 ug/mL  Salicylate level   Collection Time: 01/14/24  8:28 AM  Result Value Ref Range   Salicylate Lvl <7.0 (L) 7.0 - 30.0 mg/dL  Magnesium   Collection Time: 01/14/24  8:28 AM  Result Value Ref Range   Magnesium 0.9 (LL) 1.7 - 2.4 mg/dL  Phosphorus   Collection Time: 01/14/24  8:28 AM  Result Value Ref Range   Phosphorus 2.6 2.5 - 4.6 mg/dL  Troponin I (High Sensitivity)   Collection Time: 01/14/24  8:28 AM  Result Value Ref Range   Troponin I (High Sensitivity) 7 <18 ng/L  Magnesium   Collection Time: 01/15/24  6:07 AM  Result Value Ref Range   Magnesium 1.9 1.7 - 2.4 mg/dL  Basic metabolic panel   Collection Time: 01/16/24  6:10 AM  Result Value Ref Range  Sodium 134 (L) 135 - 145 mmol/L   Potassium 3.7 3.5 - 5.1 mmol/L   Chloride 101 98 - 111 mmol/L   CO2 22 22 - 32 mmol/L   Glucose, Bld 108 (H) 70 - 99 mg/dL   BUN 15 6 - 20 mg/dL   Creatinine, Ser 9.14 0.61 - 1.24 mg/dL   Calcium 9.4 8.9 - 78.2 mg/dL   GFR, Estimated >95 >62 mL/min   Anion gap 10 5 - 15  Magnesium   Collection Time: 01/16/24  6:10 AM  Result Value Ref Range   Magnesium 1.8 1.7 - 2.4 mg/dL  Phosphorus   Collection Time: 01/16/24  6:10 AM  Result Value Ref Range   Phosphorus 4.3 2.5 - 4.6 mg/dL  Glucose, capillary   Collection Time: 01/16/24  9:00 AM  Result Value Ref Range   Glucose-Capillary 183 (H) 70 - 99 mg/dL  Glucose, capillary   Collection Time: 01/16/24  4:36 PM  Result Value Ref Range   Glucose-Capillary 79 70 - 99 mg/dL  Glucose, capillary   Collection Time: 01/17/24  8:33 AM  Result Value Ref Range   Glucose-Capillary 187 (H) 70 - 99 mg/dL  Glucose, capillary   Collection Time: 01/17/24  4:10 PM  Result  Value Ref Range   Glucose-Capillary 196 (H) 70 - 99 mg/dL       Assessment & Plan:   Problem List Items Addressed This Visit       Other   Hypokalemia   patient reports feeling much better. rechecking labs       Relevant Orders   COMPLETE METABOLIC PANEL WITH GFR   Hypomagnesemia   patient reports feeling much better. rechecking labs       Relevant Orders   Magnesium   Alcohol withdrawal (HCC)   patient reports feeling much better. rechecking labs       Relevant Orders   COMPLETE METABOLIC PANEL WITH GFR   Magnesium   B12 and Folate Panel   VITAMIN D 25 Hydroxy (Vit-D Deficiency, Fractures)   Vitamin B1   Phosphorus   Other Visit Diagnoses       Hospital discharge follow-up    -  Primary   patient reports feeling much better. rechecking labs   Relevant Orders   COMPLETE METABOLIC PANEL WITH GFR   Magnesium   B12 and Folate Panel   VITAMIN D 25 Hydroxy (Vit-D Deficiency, Fractures)   Vitamin B1   Phosphorus     Hyponatremia       patient reports feeling much better. rechecking labs   Relevant Orders   COMPLETE METABOLIC PANEL WITH GFR     Syncope, unspecified syncope type       patient reports he feels fine, will get labs.       Reviewed hospital discharge summary and labs.  Will repeat labs, electrolytes and vitamins today.  Again discussed importance of alcohol cessation.   Follow up plan: Return if symptoms worsen or fail to improve.
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
## 2024-01-19 ENCOUNTER — Encounter: Payer: Self-pay | Admitting: Nurse Practitioner

## 2024-01-24 LAB — VITAMIN D 25 HYDROXY (VIT D DEFICIENCY, FRACTURES): Vit D, 25-Hydroxy: 48 ng/mL (ref 30–100)

## 2024-01-24 LAB — COMPLETE METABOLIC PANEL WITH GFR
AG Ratio: 1.4 (calc) (ref 1.0–2.5)
ALT: 93 U/L — ABNORMAL HIGH (ref 9–46)
AST: 123 U/L — ABNORMAL HIGH (ref 10–40)
Albumin: 4.6 g/dL (ref 3.6–5.1)
Alkaline phosphatase (APISO): 108 U/L (ref 36–130)
BUN: 23 mg/dL (ref 7–25)
CO2: 20 mmol/L (ref 20–32)
Calcium: 9.2 mg/dL (ref 8.6–10.3)
Chloride: 104 mmol/L (ref 98–110)
Creat: 1.12 mg/dL (ref 0.60–1.29)
Globulin: 3.3 g/dL (ref 1.9–3.7)
Glucose, Bld: 67 mg/dL (ref 65–99)
Potassium: 4.4 mmol/L (ref 3.5–5.3)
Sodium: 141 mmol/L (ref 135–146)
Total Bilirubin: 0.5 mg/dL (ref 0.2–1.2)
Total Protein: 7.9 g/dL (ref 6.1–8.1)
eGFR: 85 mL/min/{1.73_m2} (ref >=60)

## 2024-01-24 LAB — VITAMIN B1: Vitamin B1 (Thiamine): 23 nmol/L (ref 8–30)

## 2024-01-24 LAB — MAGNESIUM: Magnesium: 1.8 mg/dL (ref 1.5–2.5)

## 2024-01-24 LAB — B12 AND FOLATE PANEL
Folate: 9.6 ng/mL
Vitamin B-12: 594 pg/mL (ref 200–1100)

## 2024-01-24 LAB — PHOSPHORUS: Phosphorus: 3.6 mg/dL (ref 2.5–4.5)

## 2024-03-04 ENCOUNTER — Other Ambulatory Visit: Payer: Self-pay

## 2024-03-04 ENCOUNTER — Emergency Department
Admission: EM | Admit: 2024-03-04 | Discharge: 2024-03-05 | Disposition: A | Discharge status: Home / Self Care | Payer: Self-pay | Attending: Emergency Medicine | Admitting: Emergency Medicine

## 2024-03-04 DIAGNOSIS — R443 Hallucinations, unspecified: Secondary | ICD-10-CM | POA: Insufficient documentation

## 2024-03-04 DIAGNOSIS — G4489 Other headache syndrome: Secondary | ICD-10-CM | POA: Diagnosis not present

## 2024-03-04 DIAGNOSIS — F1010 Alcohol abuse, uncomplicated: Secondary | ICD-10-CM | POA: Diagnosis present

## 2024-03-04 DIAGNOSIS — R000 Tachycardia, unspecified: Secondary | ICD-10-CM | POA: Diagnosis not present

## 2024-03-04 DIAGNOSIS — I10 Essential (primary) hypertension: Secondary | ICD-10-CM | POA: Diagnosis not present

## 2024-03-04 DIAGNOSIS — R001 Bradycardia, unspecified: Secondary | ICD-10-CM | POA: Diagnosis not present

## 2024-03-04 DIAGNOSIS — R442 Other hallucinations: Secondary | ICD-10-CM | POA: Diagnosis not present

## 2024-03-04 LAB — CBC
HCT: 32.9 % — ABNORMAL LOW (ref 39.0–52.0)
Hemoglobin: 12.2 g/dL — ABNORMAL LOW (ref 13.0–17.0)
MCH: 33.9 pg (ref 26.0–34.0)
MCHC: 37.1 g/dL — ABNORMAL HIGH (ref 30.0–36.0)
MCV: 91.4 fL (ref 80.0–100.0)
Platelets: 111 10*3/uL — ABNORMAL LOW (ref 150–400)
RBC: 3.6 MIL/uL — ABNORMAL LOW (ref 4.22–5.81)
RDW: 11.5 % (ref 11.5–15.5)
WBC: 6.7 10*3/uL (ref 4.0–10.5)
nRBC: 0 % (ref 0.0–0.2)

## 2024-03-04 LAB — COMPREHENSIVE METABOLIC PANEL WITH GFR
ALT: 20 U/L (ref 0–44)
AST: 41 U/L (ref 15–41)
Albumin: 4.3 g/dL (ref 3.5–5.0)
Alkaline Phosphatase: 104 U/L (ref 38–126)
Anion gap: 15 (ref 5–15)
BUN: 17 mg/dL (ref 6–20)
CO2: 18 mmol/L — ABNORMAL LOW (ref 22–32)
Calcium: 9.1 mg/dL (ref 8.9–10.3)
Chloride: 104 mmol/L (ref 98–111)
Creatinine, Ser: 0.78 mg/dL (ref 0.61–1.24)
GFR, Estimated: >60 mL/min (ref >60)
Glucose, Bld: 121 mg/dL — ABNORMAL HIGH (ref 70–99)
Potassium: 3.1 mmol/L — ABNORMAL LOW (ref 3.5–5.1)
Sodium: 137 mmol/L (ref 135–145)
Total Bilirubin: 1.7 mg/dL — ABNORMAL HIGH (ref 0.0–1.2)
Total Protein: 7.8 g/dL (ref 6.5–8.1)

## 2024-03-04 LAB — ETHANOL: Alcohol, Ethyl (B): <10 mg/dL (ref <10)

## 2024-03-04 MED ORDER — LORAZEPAM 2 MG/ML IJ SOLN
1.0000 mg | INTRAMUSCULAR | Status: DC | PRN
  Administered 2024-03-04: 2 mg via INTRAVENOUS
  Filled 2024-03-04: qty 1

## 2024-03-04 MED ORDER — THIAMINE HCL 100 MG/ML IJ SOLN: 100.0000 mg | Freq: Every day | INTRAMUSCULAR | Status: DC

## 2024-03-04 MED ORDER — THIAMINE MONONITRATE 100 MG PO TABS
100.0000 mg | ORAL_TABLET | Freq: Every day | ORAL | Status: DC
  Administered 2024-03-04: 100 mg via ORAL
  Filled 2024-03-04: qty 1

## 2024-03-04 MED ORDER — ADULT MULTIVITAMIN W/MINERALS CH
1.0000 | ORAL_TABLET | Freq: Every day | ORAL | Status: DC
  Administered 2024-03-04: 1 via ORAL
  Filled 2024-03-04: qty 1

## 2024-03-04 MED ORDER — CHLORDIAZEPOXIDE HCL 25 MG PO CAPS: ORAL_CAPSULE | ORAL | 0 refills | Status: DC

## 2024-03-04 MED ORDER — FOLIC ACID 1 MG PO TABS
1.0000 mg | ORAL_TABLET | Freq: Every day | ORAL | Status: DC
  Administered 2024-03-04: 1 mg via ORAL
  Filled 2024-03-04: qty 1

## 2024-03-04 MED ORDER — LORAZEPAM 1 MG PO TABS
1.0000 mg | ORAL_TABLET | ORAL | Status: DC | PRN
  Administered 2024-03-04: 1 mg via ORAL
  Filled 2024-03-04: qty 1

## 2024-03-04 NOTE — ED Provider Notes (Signed)
 [facility] Provider Note    Event Date/Time   First MD Initiated Contact with Patient 03/04/24 1640     (approximate)   History   Alcohol Problem   HPI  Melvin Knight is a 43 y.o. male who presents to the emergency department today because of concerns for feeling hallucinations.  Patient states he has a history of alcohol abuse.  Last drink 2 days ago.  Today he started feeling weak and then felt like he was seeing cars driving around his house there.  He states he has been having some tremors and nausea as well.  Per chart review patient has had multiple admissions to the hospital this year for alcohol withdrawal.     Physical Exam   Triage Vital Signs: ED Triage Vitals  Encounter Vitals Group     BP 03/04/24 1632 (!) 152/100     Systolic BP Percentile --      Diastolic BP Percentile --      Pulse Rate 03/04/24 1632 (!) 102     Resp 03/04/24 1632 19     Temp 03/04/24 1632 98 F (36.7 C)     Temp Source 03/04/24 1632 Oral     SpO2 03/04/24 1634 99 %     Weight 03/04/24 1633 195 lb (88.5 kg)     Height 03/04/24 1633 5\' 7"  (1.702 m)     Head Circumference --      Peak Flow --      Pain Score 03/04/24 1633 6     Pain Loc --      Pain Education --      Exclude from Growth Chart --     Most recent vital signs: Vitals:   03/04/24 1632 03/04/24 1634  BP: (!) 152/100   Pulse: (!) 102   Resp: 19   Temp: 98 F (36.7 C)   SpO2:  99%   General: Awake, alert, oriented. Does not appear to be responding to internal stimuli CV:  Good peripheral perfusion. Regular rate and rhythm. Resp:  Normal effort. Lungs clear. Abd:  No distention.    ED Results / Procedures / Treatments   Labs (all labs ordered are listed, but only abnormal results are displayed) Labs Reviewed  COMPREHENSIVE METABOLIC PANEL WITH GFR - Abnormal; Notable for the following components:      Result Value   Potassium 3.1 (*)    CO2 18 (*)    Glucose, Bld 121 (*)    Total  Bilirubin 1.7 (*)    All other components within normal limits  CBC - Abnormal; Notable for the following components:   RBC 3.60 (*)    Hemoglobin 12.2 (*)    HCT 32.9 (*)    MCHC 37.1 (*)    Platelets 111 (*)    All other components within normal limits  ETHANOL  URINE DRUG SCREEN, QUALITATIVE ([facility] ONLY)     EKG  None   RADIOLOGY None  PROCEDURES:  Critical Care performed: No    MEDICATIONS ORDERED IN ED: Medications - No data to display   IMPRESSION / MDM / ASSESSMENT AND PLAN / ED COURSE  I reviewed the triage vital signs and the nursing notes.                              Differential diagnosis includes, but is not limited to, alcohol withdrawal, psychiatric illness  Patient's presentation is most consistent with acute  presentation with potential threat to life or bodily function.   The patient is on the cardiac monitor to evaluate for evidence of arrhythmia and/or significant heart rate changes.  Patient presented to the emergency department today because concerns for hallucinations.  Patient does have history of alcohol abuse and complicated withdrawals in the past.  On exam patient does not appear to be responding to any internal stimuli.  Patient does not appear toxic.  Will have TTS evaluate for possible inpatient detox facilities.  TTS evaluated patient.  At this time he tells them he is not interested in inpatient detox.  On reassessment patient does state that he would want to go home at this time.  Will plan on discharging.  Will give patient prescription for Librium.      FINAL CLINICAL IMPRESSION(S) / ED DIAGNOSES   Final diagnoses:  Alcohol abuse     Note:  This document was prepared using Dragon voice recognition software and may include unintentional dictation errors.    Marylynn Soho, MD 03/04/24 [phone]

## 2024-03-04 NOTE — ED Notes (Signed)
 First nurse and registration notified pt in wr requiring transport home

## 2024-03-04 NOTE — ED Triage Notes (Signed)
 Pt arrived via EMS due to alcohol withdrawals. EMS sts that pt has been having slight shaking with visual hallucinations. Pt is A/Ox4 at this time. Pt sts that his last drink was four days ago.

## 2024-03-04 NOTE — ED Notes (Signed)
 Presenter, broadcasting attempt to contact [employer] cab for pt to transport home [facility] not open @ this time number provided to pt in wr with emergency contact numbers provided for pt to contact for transport home pt remains aox4 speaking in full clear sentences moving all extremities without apparent difficulty standing and ambulating without apparent difficulty skin appears wdi non labored resps

## 2024-03-04 NOTE — BH Assessment (Signed)
 Comprehensive Clinical Assessment (CCA) Screening, Triage and Referral Note  03/04/2024 Melvin Knight [id number]  Council "Melvin Castilla" Knight is a 43 year old, is an [place] speaking, Caucasian male who presented to [facility] ED voluntarily. Pt reported that he became extremely weak, began hallucinating, vomiting, and having headaches prior to crawling to his neighbor's house to call 911. Pt admitted to having his last drink of alcohol 2 days ago. 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 Pt denies current SI, HI, AV/H. Pt reports a hx of 2 previous inpatient rehab encounters due to his alcoholism. Pt reported that he does not crave alcohol, but understands that he has a problem. Pt reported that he had a clean time of around 3 weeks. Pt reported that he began drinking in high school and has been a heavy drinker ever since. Pt reports drinking 4-5 glasses of liquor, daily. Pt denied all other substance use. Pt is not connected to a psychiatrist or therapist does not get medication. Pt denies any upcoming court dates. Pt is oriented x4. Pt expressed no desire for substance abuse treatment or therapy resources. Pt explained that he is not ready to go to rehab for alcohol cessation at this time. Pt is in the precontemplation stage of change. Pt's thought processes were relevant and coherent. Pt had fair insight. Pt's BAL was unremarkable; UDS is pending. Chief Complaint:  Chief Complaint  Patient presents with   Alcohol Problem   Visit Diagnosis: Alcohol withdrawal (HCC)  Alcohol use disorder, severe  Patient Reported Information How did you hear about [place]? Self  What Is the Reason for Your Visit/Call Today? Pt arrived via EMS due to alcohol withdrawals. EMS sts that pt has been having slight shaking with visual hallucinations. Pt is A/Ox4 at this time. Pt sts that his last drink was four days ago.  How Long Has This Been Causing You Problems? <Week  What Do You  Feel Would Help You the Most Today? -- (Drivers license and transportation access)   Have You Recently Had Any Thoughts About Hurting Yourself? No  Are You Planning to Commit Suicide/Harm Yourself At This time? No data recorded  Have you Recently Had Thoughts About Hurting Someone Melvin Knight? No  Are You Planning to Harm Someone at This Time? No  Explanation: Pt denied SI/HI   Have You Used Any Alcohol or Drugs in the Past 24 Hours? No  How Long Ago Did You Use Drugs or Alcohol? No data recorded What Did You Use and How Much? No data recorded  Do You Currently Have a Therapist/Psychiatrist? No  Name of Therapist/Psychiatrist: No data recorded  Have You Been Recently Discharged From Any Office Practice or Programs? No  Explanation of Discharge From Practice/Program: No data recorded   CCA Screening Triage Referral Assessment Type of Contact: Face-to-Face  Telemedicine Service Delivery:   Is this Initial or Reassessment?   Date Telepsych consult ordered in [facility]:    Time Telepsych consult ordered in [facility]L:    Location of Assessment: [facility] ED  Provider Location: [facility] ED    Collateral Involvement: None provided   Does Patient Have a Court Appointed Legal Guardian? No data recorded Name and Contact of Legal Guardian: No data recorded If Minor and Not Living with Parent(s), Who has Custody? n/a  Is CPS involved or ever been involved? Never  Is APS involved or ever been involved? Never   Patient Determined To Be At Risk for Harm To Self or  Others Based on Review of Patient Reported Information or Presenting Complaint? No  Method: No Plan  Availability of Means: No access or NA  Intent: Vague intent or NA  Notification Required: No need or identified person  Additional Information for Danger to Others Potential: -- (n/a)  Additional Comments for Danger to Others Potential: n/a  Are There Guns or Other Weapons in Your Home? No  Types of Guns/Weapons: n/a  Are These  Weapons Safely Secured?                            No  Who Could Verify You Are Able To Have These Secured: n/a  Do You Have any Outstanding Charges, Pending Court Dates, Parole/Probation? None reported  Contacted To Inform of Risk of Harm To Self or Others: -- (n/a)   Does Patient Present under Involuntary Commitment? No    [place] of Residence: [place]   Patient Currently Receiving the Following Services: Not Receiving Services   Determination of Need: Urgent (48 hours)   Options For Referral: ED Visit   Disposition Recommendation per psychiatric provider: There are no psychiatric contraindications to discharge at this time  Melvin Knight R Melvin Knight, LCAS

## 2024-03-22 DIAGNOSIS — F411 Generalized anxiety disorder: Secondary | ICD-10-CM | POA: Diagnosis not present

## 2024-03-22 DIAGNOSIS — Z79899 Other long term (current) drug therapy: Secondary | ICD-10-CM | POA: Diagnosis not present

## 2024-08-28 NOTE — Telephone Encounter (Signed)
 Personal health summary received and entered into EPIC on 08/28/24. Donor reported that his weight is 200 lbs and height of 5'7 / BMI = 31.3%.    Entered living donor candidate referral in system today. Ineligible patients' donor evaluation was ruled out due to:   High BMI% HTN

## 2024-09-14 ENCOUNTER — Other Ambulatory Visit: Payer: Self-pay

## 2024-09-14 ENCOUNTER — Observation Stay
Admission: EM | Admit: 2024-09-14 | Discharge: 2024-09-15 | Discharge status: Against Medical Advice | Attending: Obstetrics and Gynecology | Admitting: Obstetrics and Gynecology

## 2024-09-14 DIAGNOSIS — F10939 Alcohol use, unspecified with withdrawal, unspecified: Secondary | ICD-10-CM | POA: Diagnosis not present

## 2024-09-14 DIAGNOSIS — F10930 Alcohol use, unspecified with withdrawal, uncomplicated: Secondary | ICD-10-CM | POA: Diagnosis present

## 2024-09-14 DIAGNOSIS — Z720 Tobacco use: Secondary | ICD-10-CM | POA: Diagnosis present

## 2024-09-14 DIAGNOSIS — R7989 Other specified abnormal findings of blood chemistry: Secondary | ICD-10-CM | POA: Diagnosis present

## 2024-09-14 DIAGNOSIS — R945 Abnormal results of liver function studies: Secondary | ICD-10-CM | POA: Insufficient documentation

## 2024-09-14 DIAGNOSIS — I10 Essential (primary) hypertension: Secondary | ICD-10-CM | POA: Diagnosis not present

## 2024-09-14 DIAGNOSIS — F17210 Nicotine dependence, cigarettes, uncomplicated: Secondary | ICD-10-CM | POA: Insufficient documentation

## 2024-09-14 DIAGNOSIS — K709 Alcoholic liver disease, unspecified: Secondary | ICD-10-CM | POA: Diagnosis not present

## 2024-09-14 DIAGNOSIS — E669 Obesity, unspecified: Secondary | ICD-10-CM | POA: Diagnosis present

## 2024-09-14 LAB — COMPREHENSIVE METABOLIC PANEL WITH GFR
ALT: 37 U/L (ref 0–44)
AST: 47 U/L — ABNORMAL HIGH (ref 15–41)
Albumin: 4.5 g/dL (ref 3.5–5.0)
Alkaline Phosphatase: 90 U/L (ref 38–126)
Anion gap: 16 — ABNORMAL HIGH (ref 5–15)
BUN: 22 mg/dL — ABNORMAL HIGH (ref 6–20)
CO2: 21 mmol/L — ABNORMAL LOW (ref 22–32)
Calcium: 9.4 mg/dL (ref 8.9–10.3)
Chloride: 99 mmol/L (ref 98–111)
Creatinine, Ser: 0.81 mg/dL (ref 0.61–1.24)
GFR, Estimated: >60 mL/min (ref >60)
Glucose, Bld: 114 mg/dL — ABNORMAL HIGH (ref 70–99)
Potassium: 3.9 mmol/L (ref 3.5–5.1)
Sodium: 136 mmol/L (ref 135–145)
Total Bilirubin: 1.3 mg/dL — ABNORMAL HIGH (ref 0.0–1.2)
Total Protein: 8.7 g/dL — ABNORMAL HIGH (ref 6.5–8.1)

## 2024-09-14 LAB — CBC
HCT: 43.6 % (ref 39.0–52.0)
Hemoglobin: 15.5 g/dL (ref 13.0–17.0)
MCH: 33.3 pg (ref 26.0–34.0)
MCHC: 35.6 g/dL (ref 30.0–36.0)
MCV: 93.8 fL (ref 80.0–100.0)
Platelets: 129 K/uL — ABNORMAL LOW (ref 150–400)
RBC: 4.65 MIL/uL (ref 4.22–5.81)
RDW: 13.1 % (ref 11.5–15.5)
WBC: 4.7 K/uL (ref 4.0–10.5)
nRBC: 0 % (ref 0.0–0.2)

## 2024-09-14 LAB — URINE DRUG SCREEN, QUALITATIVE (ARMC ONLY)
Amphetamines, Ur Screen: NOT DETECTED
Barbiturates, Ur Screen: NOT DETECTED
Benzodiazepine, Ur Scrn: NOT DETECTED
Cannabinoid 50 Ng, Ur ~~LOC~~: NOT DETECTED
Cocaine Metabolite,Ur ~~LOC~~: NOT DETECTED
MDMA (Ecstasy)Ur Screen: NOT DETECTED
Methadone Scn, Ur: NOT DETECTED
Opiate, Ur Screen: NOT DETECTED
Phencyclidine (PCP) Ur S: NOT DETECTED
Tricyclic, Ur Screen: NOT DETECTED

## 2024-09-14 LAB — ETHANOL: Alcohol, Ethyl (B): <15 mg/dL (ref <15)

## 2024-09-14 MED ORDER — LORAZEPAM 2 MG/ML IJ SOLN
0.0000 mg | Freq: Four times a day (QID) | INTRAMUSCULAR | Status: DC
  Administered 2024-09-14: 1 mg via INTRAVENOUS
  Filled 2024-09-14: qty 1

## 2024-09-14 MED ORDER — ADULT MULTIVITAMIN W/MINERALS CH
1.0000 | ORAL_TABLET | Freq: Every day | ORAL | Status: DC
  Administered 2024-09-14 – 2024-09-15 (×2): 1 via ORAL
  Filled 2024-09-14 (×2): qty 1

## 2024-09-14 MED ORDER — LORAZEPAM 2 MG PO TABS: 0.0000 mg | ORAL_TABLET | Freq: Four times a day (QID) | ORAL | Status: DC

## 2024-09-14 MED ORDER — THIAMINE MONONITRATE 100 MG PO TABS: 100.0000 mg | ORAL_TABLET | Freq: Every day | ORAL | Status: DC

## 2024-09-14 MED ORDER — LORAZEPAM 2 MG/ML IJ SOLN: 0.0000 mg | Freq: Two times a day (BID) | INTRAMUSCULAR | Status: DC

## 2024-09-14 MED ORDER — NICOTINE 14 MG/24HR TD PT24
14.0000 mg | MEDICATED_PATCH | Freq: Every day | TRANSDERMAL | Status: DC
  Administered 2024-09-15: 14 mg via TRANSDERMAL
  Filled 2024-09-14 (×3): qty 1

## 2024-09-14 MED ORDER — THIAMINE MONONITRATE 100 MG PO TABS
100.0000 mg | ORAL_TABLET | Freq: Every day | ORAL | Status: DC
  Administered 2024-09-15: 100 mg via ORAL
  Filled 2024-09-14: qty 1

## 2024-09-14 MED ORDER — LORAZEPAM 1 MG PO TABS
1.0000 mg | ORAL_TABLET | ORAL | Status: DC | PRN
  Administered 2024-09-15: 1 mg via ORAL
  Filled 2024-09-14: qty 1

## 2024-09-14 MED ORDER — LORAZEPAM 2 MG PO TABS: 0.0000 mg | ORAL_TABLET | Freq: Two times a day (BID) | ORAL | Status: DC

## 2024-09-14 MED ORDER — FOLIC ACID 1 MG PO TABS
1.0000 mg | ORAL_TABLET | Freq: Every day | ORAL | Status: DC
  Administered 2024-09-14 – 2024-09-15 (×2): 1 mg via ORAL
  Filled 2024-09-14 (×2): qty 1

## 2024-09-14 MED ORDER — ACETAMINOPHEN 325 MG PO TABS
650.0000 mg | ORAL_TABLET | Freq: Four times a day (QID) | ORAL | Status: DC | PRN
Start: 2024-09-14 — End: 2024-09-15

## 2024-09-14 MED ORDER — THIAMINE HCL 100 MG/ML IJ SOLN
100.0000 mg | Freq: Every day | INTRAMUSCULAR | Status: DC
  Administered 2024-09-14: 100 mg via INTRAVENOUS
  Filled 2024-09-14: qty 2

## 2024-09-14 MED ORDER — ACETAMINOPHEN 650 MG RE SUPP: 650.0000 mg | Freq: Four times a day (QID) | RECTAL | Status: DC | PRN

## 2024-09-14 MED ORDER — FOLIC ACID 1 MG PO TABS: 1.0000 mg | ORAL_TABLET | Freq: Every day | ORAL | Status: DC

## 2024-09-14 MED ORDER — LACTATED RINGERS IV BOLUS
1000.0000 mL | Freq: Once | INTRAVENOUS | Status: AC
  Administered 2024-09-14: 1000 mL via INTRAVENOUS

## 2024-09-14 MED ORDER — LORAZEPAM 2 MG/ML IJ SOLN
1.0000 mg | INTRAMUSCULAR | Status: DC | PRN
  Administered 2024-09-14: 1 mg via INTRAVENOUS
  Filled 2024-09-14: qty 1

## 2024-09-14 MED ORDER — ENOXAPARIN SODIUM 40 MG/0.4ML IJ SOSY
40.0000 mg | PREFILLED_SYRINGE | INTRAMUSCULAR | Status: DC
  Administered 2024-09-14: 40 mg via SUBCUTANEOUS
  Filled 2024-09-14: qty 0.4

## 2024-09-14 MED ORDER — ADULT MULTIVITAMIN W/MINERALS CH: 1.0000 | ORAL_TABLET | Freq: Every day | ORAL | Status: DC

## 2024-09-14 MED ORDER — THIAMINE HCL 100 MG/ML IJ SOLN
100.0000 mg | Freq: Every day | INTRAMUSCULAR | Status: DC
Start: 2024-09-14 — End: 2024-09-14

## 2024-09-14 MED ORDER — LORAZEPAM 2 MG/ML IJ SOLN
2.0000 mg | Freq: Once | INTRAMUSCULAR | Status: AC
  Administered 2024-09-14: 2 mg via INTRAVENOUS
  Filled 2024-09-14: qty 1

## 2024-09-14 NOTE — ED Notes (Signed)
 Unsuccessful phlebotomy attempt, lab contacted
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
## 2024-09-14 NOTE — ED Provider Notes (Signed)
 [facility] Provider Note    Event Date/Time   First MD Initiated Contact with Patient 09/14/24 1318     (approximate)   History   Withdrawal   HPI  Melvin Knight is a 43 y.o. male with history of alcohol abuse, withdrawal, hypertension, anxiety, depression who presents with concern for alcohol withdrawal since this morning.  The patient states that his last drink was last night.  He states that he stopped because he ran out of alcohol.  He denies other drug use.  He reports generalized fatigue and feeling very shaky and weak.  He feels anxious as well.  Denies any vomiting.  I reviewed the past medical records.  The patient was admitted to the hospital service in February with alcohol withdrawal.   Physical Exam   Triage Vital Signs: ED Triage Vitals  Encounter Vitals Group     BP 09/14/24 1255 (!) 183/127     Girls Systolic BP Percentile --      Girls Diastolic BP Percentile --      Boys Systolic BP Percentile --      Boys Diastolic BP Percentile --      Pulse Rate 09/14/24 1255 (!) 103     Resp 09/14/24 1255 (!) 21     Temp 09/14/24 1255 98 F (36.7 C)     Temp src --      SpO2 09/14/24 1255 97 %     Weight 09/14/24 1254 205 lb (93 kg)     Height 09/14/24 1254 5' 7 (1.702 m)     Head Circumference --      Peak Flow --      Pain Score 09/14/24 1254 0     Pain Loc --      Pain Education --      Exclude from Growth Chart --     Most recent vital signs: Vitals:   09/14/24 1515 09/14/24 1530  BP:  (!) 161/117  Pulse: 89 82  Resp:  20  Temp:  98 F (36.7 C)  SpO2: 100% 100%     General: Alert and oriented, tremulous, no distress.  CV:  Good peripheral perfusion.  Resp:  Normal effort.  Abd:  No distention.  Other:  Tongue fasciculation and diffuse tremors.  Motor intact in all extremities.  Normal speech.   ED Results / Procedures / Treatments   Labs (all labs ordered are listed, but only abnormal results are displayed) Labs  Reviewed  COMPREHENSIVE METABOLIC PANEL WITH GFR - Abnormal; Notable for the following components:      Result Value   CO2 21 (*)    Glucose, Bld 114 (*)    BUN 22 (*)    Total Protein 8.7 (*)    AST 47 (*)    Total Bilirubin 1.3 (*)    Anion gap 16 (*)    All other components within normal limits  CBC - Abnormal; Notable for the following components:   Platelets 129 (*)    All other components within normal limits  ETHANOL  URINE DRUG SCREEN, QUALITATIVE ([facility] ONLY)     EKG    RADIOLOGY    PROCEDURES:  Critical Care performed: Yes, see critical care procedure note(s)  .Critical Care  Performed by: Jacolyn Pae, MD Authorized by: Jacolyn Pae, MD   Critical care provider statement:    Critical care time (minutes):  30   Critical care time was exclusive of:  Separately billable procedures and treating other patients  Critical care was necessary to treat or prevent imminent or life-threatening deterioration of the following conditions:  Toxidrome (Alcohol withdrawal)   Critical care was time spent personally by me on the following activities:  Development of treatment plan with patient or surrogate, discussions with consultants, evaluation of patient's response to treatment, examination of patient, ordering and review of laboratory studies, ordering and review of radiographic studies, ordering and performing treatments and interventions, pulse oximetry, re-evaluation of patient's condition and review of old charts    MEDICATIONS ORDERED IN ED: Medications  LORazepam  (ATIVAN ) injection 0-4 mg (1 mg Intravenous Given 09/14/24 1502)    Or  LORazepam  (ATIVAN ) tablet 0-4 mg ( Oral See Alternative 09/14/24 1502)  LORazepam  (ATIVAN ) injection 0-4 mg (has no administration in time range)    Or  LORazepam  (ATIVAN ) tablet 0-4 mg (has no administration in time range)  thiamine  (VITAMIN B1) tablet 100 mg ( Oral See Alternative 09/14/24 1345)    Or  thiamine   (VITAMIN B1) injection 100 mg (100 mg Intravenous Given 09/14/24 1345)  LORazepam  (ATIVAN ) injection 2 mg (2 mg Intravenous Given 09/14/24 1345)  lactated ringers  bolus 1,000 mL (1,000 mLs Intravenous New Bag/Given 09/14/24 1344)     IMPRESSION / MDM / ASSESSMENT AND PLAN / ED COURSE  I reviewed the triage vital signs and the nursing notes.  43 year old male with PMH as noted above presents with concern for alcohol withdrawal.  On exam he is hypertensive and borderline tachycardic.  He is tremulous and has tongue fasciculation but is alert and oriented.  Differential diagnosis includes, but is not limited to, alcohol withdrawal.  Patient's presentation is most consistent with acute presentation with potential threat to life or bodily function.  The patient is on the cardiac monitor to evaluate for evidence of arrhythmia and/or significant heart rate changes.  We will give IV Ativan , fluids, obtain lab workup, and start the patient on the CIWA protocol.  ----------------------------------------- 3:40 PM on 09/14/2024 -----------------------------------------  Patient's heart rate remains in the 90s and he is still hypertensive.  He still has tongue fasciculation and tremor.  CMP and CBC show no acute findings.  He will need additional treatment with Ativan  for drawl.  I recommend inpatient admission and the patient agrees.  I have signed him out to the oncoming ED physician Dr. Waymond pending hospitalist consult.   FINAL CLINICAL IMPRESSION(S) / ED DIAGNOSES   Final diagnoses:  Alcohol withdrawal syndrome without complication (HCC)     Rx / DC Orders   ED Discharge Orders     None        Note:  This document was prepared using Dragon voice recognition software and may include unintentional dictation errors.    Jacolyn Pae, MD 09/14/24 1540

## 2024-09-14 NOTE — ED Triage Notes (Signed)
 Pt to ED for overall not feeling well and possible ETOH withdrawal. Tremors noted, sweating, headache. Last drink yesterday.

## 2024-09-14 NOTE — Discharge Instructions (Signed)
 Residential Substance Use Treatment Services   [facility] ([facility]  [address]  [place], [place] [address]  [phone] or [phone] Detox (Medicare, Medicaid, private insurance, and self pay)  Residential Rehab 14 days (Medicare, [place], private insurance, and self pay)   [facility])  [address] [place], [place]  [phone]  Male and Male Detox (Self Pay and Medicaid limited availability)  Rehab only Male ([place] and self pay only)   [facility] [address]      [address]  [place], [place] [address]  [phone] or [phone] Detox and Residential Treatment Private Insurance Only   [facility]  [address] [place]  [place], [place] [address]  [phone]  Treatment Only, must make assessment appointment, and must be sober for assessment appointment.  Self Pay Only, Medicare A&B, [facility] [place] ID only! *Transportation assistance offered from [place] [employer]     [address] [place], [place] [address] Walk in interviews M-Sat 8-4p No pending legal charges [phone]     ADATC:  [facility] Referral  [address] [place], [place] [phone] (Self Pay, [facility])  [facility] [address] [place], [place] [address] [phone] Detox and Residential Treatment Medicare and Private Insurance  [facility] [address]  [place], [place] [address] 28 Day Women's Facility: [phone] 28 Day Men's Facility: [phone] Long-term Residential Program:  [phone] Males 25 and Over (No Insurance, upfront fee)  [facility]  [address] [place], [place] [address] [phone] Private Insurance with [employer], Private Pay  [facility] [address] [place], [place] [address] Local [phone] Private Insurance Only  [facility] [address]  [place], [place] [address]  [phone] (Males, upfront fee)  [facility] [address]  [place], [address] [phone] Private Insurance

## 2024-09-14 NOTE — H&P (Addendum)
 History and Physical    Melvin Knight FMW:[id number] DOB: [birth year] DOA: 09/14/2024  DOS: the patient was seen and examined on 09/14/2024  PCP: Bernardo Fend, DO   Patient coming from: Home  I have personally briefly reviewed patient's old medical records in [facility] and CareEverywhere  HPI:   Melvin Knight is a 43 y.o. year old male with past medical history of alcohol use disorder, HTN, hepatic steatosis, and tobacco use disorder presented to the ED with generalized weakness secondary to heavy alcohol use.    Pt states he has been drinking more over the last week and has been getting weaker to where he was having difficulty with mobility. He reports he drinks around 5 glasses of liquor daily. He went 4 months without drinking previously but given close friends that drink, started drinking again.   He reports diffuse weakness that has improved since coming to the ED. last drink was yesterday around 4 PM.   On arrival to the ED patient was noted to be HDS stable. He was noted to have earlier signs of withdrawal and placed on CIWA protocol.  Lab work with CBC without leukocytosis, CMP with mild bilirubin elevation and anion gap metabolic acidosis.  Ethanol level negative.  TRH consulted for admission.    TRH contacted for admission.  Review of Systems: As mentioned in the history of present illness. All other systems reviewed and are negative.   Past Medical History:  Diagnosis Date   AKI (acute kidney injury) 09/24/2022   Anxiety    ETOH abuse    Hypertension    Hypokalemia    Hypokalemia 09/24/2022   Hypomagnesemia 09/24/2022    History reviewed. No pertinent surgical history.   No Known Allergies  Family History  Problem Relation Age of Onset   Hypertension Father    Stroke Father    Cancer Sister     Medications: Currently not taking any medications   reports that he has been smoking cigarettes. He has never used smokeless tobacco. He reports  current alcohol use of about 6.0 standard drinks of alcohol per week. He reports that he does not currently use drugs. Lives by himself Tobacco- 1 ppd since teen years EtOH- 4-5 drinks of hard liquor Illicit drug use- denies use.  IADLs/ADLs- can perform independently at baseline    Physical Exam: Vitals:   09/14/24 1608 09/14/24 1609 09/14/24 1610 09/14/24 1611  BP:      Pulse: 87 91 90 95  Resp:      Temp:      SpO2: 100% 100% 99% 100%  Weight:      Height:         Gen: NAD HENT: NCAT CV: Sinus tachycardia, good pulses Resp: CTAB Abd: No TTP, normal bowel sounds MSK: No symmetry Skin: No rashes noted on examined skin Neuro: Alert and oriented, good strength in all extremities Psych: Pleasant mood   Labs on Admission: I have personally reviewed following labs and imaging studies  CBC: Recent Labs  Lab 09/14/24 1256  WBC 4.7  HGB 15.5  HCT 43.6  MCV 93.8  PLT 129*   Basic Metabolic Panel: Recent Labs  Lab 09/14/24 1256  NA 136  K 3.9  CL 99  CO2 21*  GLUCOSE 114*  BUN 22*  CREATININE 0.81  CALCIUM  9.4   GFR: Estimated Creatinine Clearance: 131.9 mL/min (by C-G formula based on SCr of 0.81 mg/dL). Liver Function Tests: Recent Labs  Lab 09/14/24 1256  AST 47*  ALT 37  ALKPHOS 90  BILITOT 1.3*  PROT 8.7*  ALBUMIN 4.5   No results for input(s): LIPASE, AMYLASE in the last 168 hours. No results for input(s): AMMONIA in the last 168 hours. Coagulation Profile: No results for input(s): INR, PROTIME in the last 168 hours. Cardiac Enzymes: No results for input(s): CKTOTAL, CKMB, CKMBINDEX, TROPONINI, TROPONINIHS in the last 168 hours. BNP (last 3 results) No results for input(s): BNP in the last 8760 hours. HbA1C: No results for input(s): HGBA1C in the last 72 hours. CBG: No results for input(s): GLUCAP in the last 168 hours. Lipid Profile: No results for input(s): CHOL, HDL, LDLCALC, TRIG, CHOLHDL,  LDLDIRECT in the last 72 hours. Thyroid Function Tests: No results for input(s): TSH, T4TOTAL, FREET4, T3FREE, THYROIDAB in the last 72 hours. Anemia Panel: No results for input(s): VITAMINB12, FOLATE, FERRITIN, TIBC, IRON, RETICCTPCT in the last 72 hours. Urine analysis:    Component Value Date/Time   COLORURINE YELLOW (A) 09/25/2023 0157   APPEARANCEUR CLEAR (A) 09/25/2023 0157   LABSPEC 1.015 09/25/2023 0157   PHURINE 7.0 09/25/2023 0157   GLUCOSEU 150 (A) 09/25/2023 0157   HGBUR NEGATIVE 09/25/2023 0157   BILIRUBINUR NEGATIVE 09/25/2023 0157   KETONESUR NEGATIVE 09/25/2023 0157   PROTEINUR NEGATIVE 09/25/2023 0157   NITRITE NEGATIVE 09/25/2023 0157   LEUKOCYTESUR NEGATIVE 09/25/2023 0157    Radiological Exams on Admission: I have personally reviewed images No results found.  EKG: My personal interpretation of EKG shows: pending   Assessment/Plan Principal Problem:   Alcohol withdrawal (HCC) Active Problems:   HTN (hypertension)   Abnormal LFTs   Alcohol Withdrawal Patient with history of alcohol use disorder and significant alcohol withdrawals with report of seizure  Currently on CIWA protocol. Counseled patient on alcohol cessation.  Will continue to discuss this at subsequent encounters. Pt will benefit from naltrexone at discharge.   Abnormal LFTs: Suspect secondary to alcohol use.  Discussed cessation as above.  Patient did have workup done to look for liver abnormalities including right upper quadrant that showed hepatic steatosis.  Will recommend repeating LFTs once he is abstinent for a while.  Chronic problems: Hypertension: Partly mediated by withdrawal symptoms but patient does have diagnosis of hypertension, will start him on amlodipine  as he has not taking anything recently.  Will also check A1c and lipid panel.   VTE prophylaxis:  Lovenox   Diet: Code Status:  Full Code Telemetry:  Admission status: Inpatient, Telemetry  bed Patient is from: Home Anticipated d/c is to: Home Anticipated d/c is in: 2 days   Family Communication: Updated at bedside  Consults called: None   Severity of Illness: The appropriate patient status for this patient is INPATIENT. Inpatient status is judged to be reasonable and necessary in order to provide the required intensity of service to ensure the patient's safety. The patient's presenting symptoms, physical exam findings, and initial radiographic and laboratory data in the context of their chronic comorbidities is felt to place them at high risk for further clinical deterioration. Furthermore, it is not anticipated that the patient will be medically stable for discharge from the hospital within 2 midnights of admission.   * I certify that at the point of admission it is my clinical judgment that the patient will require inpatient hospital care spanning beyond 2 midnights from the point of admission due to high intensity of service, high risk for further deterioration and high frequency of surveillance required.DEWAINE Morene Bathe, MD [place] DEL. [facility]

## 2024-09-15 DIAGNOSIS — F10939 Alcohol use, unspecified with withdrawal, unspecified: Secondary | ICD-10-CM | POA: Diagnosis not present

## 2024-09-15 LAB — CBC
HCT: 39.7 % (ref 39.0–52.0)
Hemoglobin: 14.1 g/dL (ref 13.0–17.0)
MCH: 32.6 pg (ref 26.0–34.0)
MCHC: 35.5 g/dL (ref 30.0–36.0)
MCV: 91.9 fL (ref 80.0–100.0)
Platelets: 125 K/uL — ABNORMAL LOW (ref 150–400)
RBC: 4.32 MIL/uL (ref 4.22–5.81)
RDW: 12.7 % (ref 11.5–15.5)
WBC: 3.9 K/uL — ABNORMAL LOW (ref 4.0–10.5)
nRBC: 0 % (ref 0.0–0.2)

## 2024-09-15 LAB — BASIC METABOLIC PANEL WITH GFR
Anion gap: 15 (ref 5–15)
BUN: 19 mg/dL (ref 6–20)
CO2: 21 mmol/L — ABNORMAL LOW (ref 22–32)
Calcium: 9.2 mg/dL (ref 8.9–10.3)
Chloride: 99 mmol/L (ref 98–111)
Creatinine, Ser: 0.74 mg/dL (ref 0.61–1.24)
GFR, Estimated: >60 mL/min (ref >60)
Glucose, Bld: 92 mg/dL (ref 70–99)
Potassium: 3 mmol/L — ABNORMAL LOW (ref 3.5–5.1)
Sodium: 135 mmol/L (ref 135–145)

## 2024-09-15 LAB — MAGNESIUM: Magnesium: 1.8 mg/dL (ref 1.7–2.4)

## 2024-09-15 LAB — PHOSPHORUS: Phosphorus: 3.7 mg/dL (ref 2.5–4.6)

## 2024-09-15 MED ORDER — PHENOBARBITAL 32.4 MG PO TABS: 32.4000 mg | ORAL_TABLET | Freq: Three times a day (TID) | ORAL | Status: DC

## 2024-09-15 MED ORDER — LABETALOL HCL 5 MG/ML IV SOLN
20.0000 mg | INTRAVENOUS | Status: DC | PRN
  Administered 2024-09-15: 20 mg via INTRAVENOUS
  Filled 2024-09-15: qty 4

## 2024-09-15 MED ORDER — MAGNESIUM SULFATE 2 GM/50ML IV SOLN
2.0000 g | Freq: Once | INTRAVENOUS | Status: AC
  Administered 2024-09-15: 2 g via INTRAVENOUS
  Filled 2024-09-15: qty 50

## 2024-09-15 MED ORDER — PHENOBARBITAL 32.4 MG PO TABS
64.8000 mg | ORAL_TABLET | Freq: Three times a day (TID) | ORAL | Status: DC
Start: 2024-09-15 — End: 2024-09-15
  Administered 2024-09-15 (×2): 64.8 mg via ORAL
  Filled 2024-09-15 (×2): qty 2

## 2024-09-15 MED ORDER — LOSARTAN POTASSIUM 50 MG PO TABS
50.0000 mg | ORAL_TABLET | Freq: Every day | ORAL | Status: DC
  Administered 2024-09-15: 50 mg via ORAL
  Filled 2024-09-15: qty 1

## 2024-09-15 MED ORDER — INFLUENZA VIRUS VACC SPLIT PF (FLUZONE) 0.5 ML IM SUSY: 0.5000 mL | PREFILLED_SYRINGE | INTRAMUSCULAR | Status: DC

## 2024-09-15 MED ORDER — POTASSIUM CHLORIDE CRYS ER 20 MEQ PO TBCR
40.0000 meq | EXTENDED_RELEASE_TABLET | Freq: Two times a day (BID) | ORAL | Status: DC
  Administered 2024-09-15: 40 meq via ORAL
  Filled 2024-09-15: qty 2

## 2024-09-15 NOTE — Progress Notes (Addendum)
 Pt expressed wishes to leave AMA.  Pt BP was checked and was told it was high but wished to leave still.  Nurse told patient that MD wanted to monitor him over night but pt said he had dogs he needed to take care of.  Wouk MD notified of pt wishes.  MD aware and said he would come talk to pt once he was free but it was pt choice as he was A&Ox4, however pt wanted to leave at this time.  IV was removed and AMA form was signed by pt.  MD notified that pt had left.

## 2024-09-15 NOTE — Progress Notes (Signed)
 PROGRESS NOTE    Melvin Knight  FMW:[id number] DOB: 06-Dec-1980 DOA: 09/14/2024 PCP: Melvin Fend, DO     Brief Narrative:   From admission h and p  Melvin Knight is a 43 y.o. year old male with past medical history of alcohol use disorder, HTN, hepatic steatosis, and tobacco use disorder presented to the ED with generalized weakness secondary to heavy alcohol use.      Pt states he has been drinking more over the last week and has been getting weaker to where he was having difficulty with mobility. He reports he drinks around 5 glasses of liquor daily. He went 4 months without drinking previously but given close friends that drink, started drinking again.    He reports diffuse weakness that has improved since coming to the ED. last drink was yesterday around 4 PM.     On arrival to the ED patient was noted to be HDS stable. He was noted to have earlier signs of withdrawal and placed on CIWA protocol.  Lab work with CBC without leukocytosis, CMP with mild bilirubin elevation and anion gap metabolic acidosis.  Ethanol level negative.  TRH consulted for admission.     Assessment & Plan:   Principal Problem:   Alcohol withdrawal (HCC) Active Problems:   Tobacco abuse   HTN (hypertension)   Obesity (BMI 30-39.9)   Abnormal LFTs   Alcoholic liver disease   # Alcohol use disorder with withdrawal History complicated withdrawal. Mild symptoms currently. Last drink 10/23. - continue vitamins - continue ativan  ciwa - start phenobarb taper  # Alcohol liver disease With steatosis, thrombocytopenia, mild lft elevation. Stable  # Hypertension Bp elevated - resume home losartan   # Hypokalemia # Hypomagnesemia - replete and monitor   DVT prophylaxis: lovenox  Code Status: full Family Communication: none at bedside  Level of care: Telemetry Medical Status is: Inpatient Remains inpatient appropriate because: severity of illness    Consultants:   none  Procedures: none  Antimicrobials:  none    Subjective: Feels better  Objective: Vitals:   09/15/24 0400 09/15/24 0600 09/15/24 0629 09/15/24 0700  BP: (!) 151/103 (!) 151/95  (!) 151/95  Pulse: 73 68  70  Resp: 13   14  Temp:   98.4 F (36.9 C)   TempSrc:   Oral   SpO2: 97%   100%  Weight:      Height:       No intake or output data in the 24 hours ending 09/15/24 0812 Filed Weights   09/14/24 1254  Weight: 93 kg    Examination:  General exam: Appears calm and comfortable  Respiratory system: Clear to auscultation. Respiratory effort normal. Cardiovascular system: S1 & S2 heard, RRR. No JVD, murmurs, rubs, gallops or clicks. No pedal edema. Gastrointestinal system: Abdomen is nondistended, soft and nontender. No organomegaly or masses felt. Normal bowel sounds heard. Central nervous system: Alert and oriented. No focal neurological deficits. Mild tremor. Mild diaphoresis Extremities: Symmetric 5 x 5 power. Skin: No rashes, lesions or ulcers Psychiatry: Judgement and insight appear normal. Mood & affect appropriate.     Data Reviewed: I have personally reviewed following labs and imaging studies  CBC: Recent Labs  Lab 09/14/24 1256 09/15/24 0144  WBC 4.7 3.9*  HGB 15.5 14.1  HCT 43.6 39.7  MCV 93.8 91.9  PLT 129* 125*   Basic Metabolic Panel: Recent Labs  Lab 09/14/24 1256 09/15/24 0144  NA 136 135  K 3.9 3.0*  CL 99 99  CO2  21* 21*  GLUCOSE 114* 92  BUN 22* 19  CREATININE 0.81 0.74  CALCIUM  9.4 9.2  MG  --  1.8  PHOS  --  3.7   GFR: Estimated Creatinine Clearance: 133.5 mL/min (by C-G formula based on SCr of 0.74 mg/dL). Liver Function Tests: Recent Labs  Lab 09/14/24 1256  AST 47*  ALT 37  ALKPHOS 90  BILITOT 1.3*  PROT 8.7*  ALBUMIN 4.5   No results for input(s): LIPASE, AMYLASE in the last 168 hours. No results for input(s): AMMONIA in the last 168 hours. Coagulation Profile: No results for input(s): INR,  PROTIME in the last 168 hours. Cardiac Enzymes: No results for input(s): CKTOTAL, CKMB, CKMBINDEX, TROPONINI in the last 168 hours. BNP (last 3 results) No results for input(s): PROBNP in the last 8760 hours. HbA1C: No results for input(s): HGBA1C in the last 72 hours. CBG: No results for input(s): GLUCAP in the last 168 hours. Lipid Profile: No results for input(s): CHOL, HDL, LDLCALC, TRIG, CHOLHDL, LDLDIRECT in the last 72 hours. Thyroid Function Tests: No results for input(s): TSH, T4TOTAL, FREET4, T3FREE, THYROIDAB in the last 72 hours. Anemia Panel: No results for input(s): VITAMINB12, FOLATE, FERRITIN, TIBC, IRON, RETICCTPCT in the last 72 hours. Urine analysis:    Component Value Date/Time   COLORURINE YELLOW (A) 09/25/2023 0157   APPEARANCEUR CLEAR (A) 09/25/2023 0157   LABSPEC 1.015 09/25/2023 0157   PHURINE 7.0 09/25/2023 0157   GLUCOSEU 150 (A) 09/25/2023 0157   HGBUR NEGATIVE 09/25/2023 0157   BILIRUBINUR NEGATIVE 09/25/2023 0157   KETONESUR NEGATIVE 09/25/2023 0157   PROTEINUR NEGATIVE 09/25/2023 0157   NITRITE NEGATIVE 09/25/2023 0157   LEUKOCYTESUR NEGATIVE 09/25/2023 0157   Sepsis Labs: @LABRCNTIP (procalcitonin:4,lacticidven:4)  )No results found for this or any previous visit (from the past 240 hours).       Radiology Studies: No results found.      Scheduled Meds:  enoxaparin  (LOVENOX ) injection  40 mg Subcutaneous Q24H   folic acid   1 mg Oral Daily   multivitamin with minerals  1 tablet Oral Daily   nicotine   14 mg Transdermal Daily   potassium chloride   40 mEq Oral BID   thiamine   100 mg Oral Daily   Continuous Infusions:  magnesium  sulfate bolus IVPB       LOS: 1 day     Melvin KATHEE Ban, MD [facility]   If 7PM-7AM, please contact night-coverage [email or web address] Password [email or web address] 09/15/2024, 8:12 AM

## 2024-09-15 NOTE — Discharge Summary (Signed)
 Melvin Knight FMW:[id number] DOB: 21-Nov-1981 DOA: 09/14/2024  PCP: Bernardo Fend, DO  Admit date: 09/14/2024 Discharge date: 09/15/2024  Time spent: 20 minutes     Discharge Diagnoses:  Principal Problem:   Alcohol withdrawal (HCC) Active Problems:   Tobacco abuse   HTN (hypertension)   Obesity (BMI 30-39.9)   Abnormal LFTs   Alcoholic liver disease   Discharge Condition: stable   Filed Weights   09/14/24 1254  Weight: 93 kg    History of present illness:  From admission h and p Melvin Knight is a 43 y.o. year old male with past medical history of alcohol use disorder, HTN, hepatic steatosis, and tobacco use disorder presented to the ED with generalized weakness secondary to heavy alcohol use.      Pt states he has been drinking more over the last week and has been getting weaker to where he was having difficulty with mobility. He reports he drinks around 5 glasses of liquor daily. He went 4 months without drinking previously but given close friends that drink, started drinking again.    He reports diffuse weakness that has improved since coming to the ED. last drink was yesterday around 4 PM.     On arrival to the ED patient was noted to be HDS stable. He was noted to have earlier signs of withdrawal and placed on CIWA protocol.  Lab work with CBC without leukocytosis, CMP with mild bilirubin elevation and anion gap metabolic acidosis.  Ethanol level negative.  TRH consulted for admission.       Hospital Course:   History alcohol abuse presenting with mild symptoms of withdrawal, requesting detox. Started on ativan  ciwa; given history of complicated withdrawal, phenobar taper was added on hospital day one. On hospital day 1 patient alert and oriented, left against medial advice, would not wait for MD to see. As withdrawal symptoms were mild and patient alert and oriented, no significant safety barriers identified.   Procedures: none    Consultations: none  Discharge Exam: Vitals:   09/15/24 1219 09/15/24 1648  BP: (!) 183/126 (!) 160/127  Pulse: 91 95  Resp: 17 18  Temp: 98.2 F (36.8 C) 98.5 F (36.9 C)  SpO2: 95% 97%    Exam: See progress note from day of discharge  Discharge Instructions     No Known Allergies    The results of significant diagnostics from this hospitalization (including imaging, microbiology, ancillary and laboratory) are listed below for reference.    Significant Diagnostic Studies: No results found.  Microbiology: No results found for this or any previous visit (from the past 240 hours).   Labs: Basic Metabolic Panel: Recent Labs  Lab 09/14/24 1256 09/15/24 0144  NA 136 135  K 3.9 3.0*  CL 99 99  CO2 21* 21*  GLUCOSE 114* 92  BUN 22* 19  CREATININE 0.81 0.74  CALCIUM  9.4 9.2  MG  --  1.8  PHOS  --  3.7   Liver Function Tests: Recent Labs  Lab 09/14/24 1256  AST 47*  ALT 37  ALKPHOS 90  BILITOT 1.3*  PROT 8.7*  ALBUMIN 4.5   No results for input(s): LIPASE, AMYLASE in the last 168 hours. No results for input(s): AMMONIA in the last 168 hours. CBC: Recent Labs  Lab 09/14/24 1256 09/15/24 0144  WBC 4.7 3.9*  HGB 15.5 14.1  HCT 43.6 39.7  MCV 93.8 91.9  PLT 129* 125*   Cardiac Enzymes: No results for input(s): CKTOTAL, CKMB, CKMBINDEX, TROPONINI in  the last 168 hours. BNP: BNP (last 3 results) No results for input(s): BNP in the last 8760 hours.  ProBNP (last 3 results) No results for input(s): PROBNP in the last 8760 hours.  CBG: No results for input(s): GLUCAP in the last 168 hours.     Signed:  Devaughn KATHEE Ban MD.  [facility] 09/15/2024, 5:55 PM

## 2024-09-19 ENCOUNTER — Ambulatory Visit: Admitting: Internal Medicine

## 2024-09-19 ENCOUNTER — Encounter: Payer: Self-pay | Admitting: Internal Medicine

## 2024-09-19 VITALS — BP 152/100 | HR 110 | Temp 98.0°F | Resp 18 | Ht 67.0 in | Wt 215.9 lb

## 2024-09-19 DIAGNOSIS — E876 Hypokalemia: Secondary | ICD-10-CM | POA: Diagnosis not present

## 2024-09-19 DIAGNOSIS — I10 Essential (primary) hypertension: Secondary | ICD-10-CM | POA: Diagnosis not present

## 2024-09-19 DIAGNOSIS — F1020 Alcohol dependence, uncomplicated: Secondary | ICD-10-CM | POA: Diagnosis not present

## 2024-09-19 DIAGNOSIS — Z09 Encounter for follow-up examination after completed treatment for conditions other than malignant neoplasm: Secondary | ICD-10-CM

## 2024-09-19 MED ORDER — NALTREXONE HCL 50 MG PO TABS: 50.0000 mg | ORAL_TABLET | Freq: Every day | ORAL | 1 refills | Status: DC

## 2024-09-19 MED ORDER — LOSARTAN POTASSIUM 50 MG PO TABS: 50.0000 mg | ORAL_TABLET | Freq: Every day | ORAL | 3 refills | Status: DC

## 2024-09-19 NOTE — Progress Notes (Signed)
 Established Patient Office Visit  Subjective:  Patient ID: Melvin Knight, male    DOB: 10-30-1981  Age: 43 y.o. MRN: [id number]  CC:  Chief Complaint  Patient presents with   Hospitalization Follow-up    HPI Melvin Knight presents for hospital follow up.  Discharge Date: 09/14/24 Diagnosis: alcohol withdrawal, electrolyte disturbances Procedures/tests: Potassium 3.0,  WBC 3.9,  platelets 125, total bili 1.3, AST 47, ALT 37 New medications: None, left AMA Discontinued medications: None Discharge instructions:  Recheck labs Status: better  Hypertension: -Medications: Had been on Hydralazine  50 mg once daily, Losartan  50 mg and Metoprolol  but hasn't taken anything in months -Had been on Benazepril -HCTZ 20-12.5 mg, Metoprolol  25 mg daily - per patient once he stopped drinking he was becoming light headed in rehab, so all of his blood pressure medications were discontinued. -Denies any SOB, CP, vision changes, LE edema but does occasionally get lightheaded.   Elevated LFTs/Alcohol Use Disorder: -Liver function 10/25 AST 47, ALT 37, alk phos 90 -Liver US  10/24 showing fatty liver -States last drink was when he was in the hospital, interested in Naltrexone  Past Medical History:  Diagnosis Date   AKI (acute kidney injury) 09/24/2022   Anxiety    ETOH abuse    Hypertension    Hypokalemia    Hypokalemia 09/24/2022   Hypomagnesemia 09/24/2022    No past surgical history on file.  Family History  Problem Relation Age of Onset   Hypertension Father    Stroke Father    Cancer Sister     Social History   Socioeconomic History   Marital status: Single    Spouse name: Not on file   Number of children: Not on file   Years of education: Not on file   Highest education level: Bachelor's degree (e.g., BA, AB, BS)  Occupational History   Not on file  Tobacco Use   Smoking status: Every Day    Current packs/day: 1.00    Types: Cigarettes   Smokeless tobacco: Never  Vaping  Use   Vaping status: Never Used  Substance and Sexual Activity   Alcohol use: Yes    Alcohol/week: 6.0 standard drinks of alcohol    Types: 6 Shots of liquor per week    Comment: 6 cocktails per day   Drug use: Not Currently   Sexual activity: Not Currently  Other Topics Concern   Not on file  Social History Narrative   Right handed    Social Drivers of Health   Financial Resource Strain: Low Risk  (03/16/2023)   Overall Financial Resource Strain (CARDIA)    Difficulty of Paying Living Expenses: Not hard at all  Food Insecurity: No Food Insecurity (09/15/2024)   Hunger Vital Sign    Worried About Running Out of Food in the Last Year: Never true    Ran Out of Food in the Last Year: Never true  Transportation Needs: No Transportation Needs (09/15/2024)   PRAPARE - Administrator, Civil Service (Medical): No    Lack of Transportation (Non-Medical): No  Physical Activity: Sufficiently Active (03/16/2023)   Exercise Vital Sign    Days of Exercise per Week: 3 days    Minutes of Exercise per Session: 150+ min  Stress: No Stress Concern Present (03/16/2023)   [employer] of Occupational Health - Occupational Stress Questionnaire    Feeling of Stress : Not at all  Social Connections: Socially Isolated (01/03/2024)   Social Connection and Isolation Panel    Frequency  of Communication with Friends and Family: More than three times a week    Frequency of Social Gatherings with Friends and Family: Twice a week    Attends Religious Services: Never    Database Administrator or Organizations: No    Attends Banker Meetings: Never    Marital Status: Never married  Intimate Partner Violence: Not At Risk (09/15/2024)   Humiliation, Afraid, Rape, and Kick questionnaire    Fear of Current or Ex-Partner: No    Emotionally Abused: No    Physically Abused: No    Sexually Abused: No    Outpatient Medications Prior to Visit  Medication Sig Dispense Refill    Multiple Vitamins-Minerals (MULTIVITAMIN WITH MINERALS) tablet Take 1 tablet by mouth daily. 90 tablet 3   acamprosate  (CAMPRAL ) 333 MG tablet Take 666 mg by mouth 3 (three) times daily. (Patient not taking: Reported on 09/19/2024)     chlordiazePOXIDE  (LIBRIUM ) 25 MG capsule Day 1 - 50 mg every 6 hours Day 2 - 25 mg every 6 hours Day 3 - 25 mg twice a day Day 4- 25 mg at night (Patient not taking: Reported on 09/19/2024) 15 capsule 0   FLUoxetine  (PROZAC ) 40 MG capsule Take 40 mg by mouth every morning. (Patient not taking: Reported on 09/19/2024)     folic acid  (FOLVITE ) 1 MG tablet Take 1 tablet (1 mg total) by mouth daily. (Patient not taking: Reported on 09/19/2024) 30 tablet 2   hydrALAZINE  (APRESOLINE ) 50 MG tablet Take 1 tablet (50 mg total) by mouth 2 (two) times daily. If systolic BP greater than 150 mmHg. (Patient not taking: Reported on 09/19/2024)     losartan  (COZAAR ) 50 MG tablet Take 1 tablet (50 mg total) by mouth daily. (Patient not taking: Reported on 09/19/2024) 90 tablet 3   Magnesium  400 MG TABS Take 1 tablet by mouth 2 (two) times daily. (Patient not taking: Reported on 09/19/2024)     metoprolol  succinate (TOPROL -XL) 25 MG 24 hr tablet Take 12.5 mg by mouth every morning. (Patient not taking: Reported on 09/19/2024)     pantoprazole  (PROTONIX ) 40 MG tablet Take 1 tablet (40 mg total) by mouth daily. (Patient not taking: Reported on 09/19/2024) 14 tablet 0   thiamine  (VITAMIN B1) 100 MG tablet Take 100 mg by mouth every morning. (Patient not taking: Reported on 09/19/2024)     traZODone  (DESYREL ) 50 MG tablet Take 50-100 mg by mouth at bedtime as needed. (Patient not taking: Reported on 09/19/2024)     vitamin B-12 (CYANOCOBALAMIN ) 100 MCG tablet Take 100 mcg by mouth daily. (Patient not taking: Reported on 09/19/2024)     No facility-administered medications prior to visit.    No Known Allergies  ROS Review of Systems  Cardiovascular:  Negative for palpitations.   Neurological:  Negative for tremors, weakness and numbness.  All other systems reviewed and are negative.     Objective:    Physical Exam Constitutional:      Appearance: Normal appearance.  HENT:     Head: Normocephalic and atraumatic.  Eyes:     Conjunctiva/sclera: Conjunctivae normal.  Cardiovascular:     Rate and Rhythm: Normal rate and regular rhythm.  Pulmonary:     Effort: Pulmonary effort is normal.     Breath sounds: Normal breath sounds.  Skin:    General: Skin is warm and dry.  Neurological:     General: No focal deficit present.     Mental Status: He is alert. Mental status  is at baseline.  Psychiatric:        Mood and Affect: Mood normal.     BP (!) 154/108 (Cuff Size: Large)   Pulse (!) 110   Temp 98 F (36.7 C) (Oral)   Resp 18   Ht 5' 7 (1.702 m)   Wt 215 lb 14.4 oz (97.9 kg)   SpO2 98%   BMI 33.81 kg/m  Wt Readings from Last 3 Encounters:  09/19/24 215 lb 14.4 oz (97.9 kg)  09/14/24 205 lb (93 kg)  03/04/24 195 lb (88.5 kg)   Vitals:   09/19/24 1416  BP: (!) 154/108    Health Maintenance Due  Topic Date Due   Pneumococcal Vaccine (1 of 2 - PCV) Never done   Hepatitis B Vaccines 19-59 Average Risk (1 of 3 - 19+ 3-dose series) Never done   HPV VACCINES (1 - 3-dose SCDM series) Never done   Influenza Vaccine  06/22/2024   COVID-19 Vaccine (2 - 2025-26 season) 07/23/2024        Topic Date Due   Hepatitis B Vaccines 19-59 Average Risk (1 of 3 - 19+ 3-dose series) Never done   HPV VACCINES (1 - 3-dose SCDM series) Never done    Lab Results  Component Value Date   TSH 1.562 04/18/2023   Lab Results  Component Value Date   WBC 3.9 (L) 09/15/2024   HGB 14.1 09/15/2024   HCT 39.7 09/15/2024   MCV 91.9 09/15/2024   PLT 125 (L) 09/15/2024   Lab Results  Component Value Date   NA 135 09/15/2024   K 3.0 (L) 09/15/2024   CO2 21 (L) 09/15/2024   GLUCOSE 92 09/15/2024   BUN 19 09/15/2024   CREATININE 0.74 09/15/2024   BILITOT 1.3  (H) 09/14/2024   ALKPHOS 90 09/14/2024   AST 47 (H) 09/14/2024   ALT 37 09/14/2024   PROT 8.7 (H) 09/14/2024   ALBUMIN 4.5 09/14/2024   CALCIUM  9.2 09/15/2024   ANIONGAP 15 09/15/2024   EGFR 85 01/18/2024   Lab Results  Component Value Date   CHOL 258 (H) 01/11/2022   Lab Results  Component Value Date   HDL 61 01/11/2022   Lab Results  Component Value Date   LDLCALC 166 (H) 01/11/2022   Lab Results  Component Value Date   TRIG 162 (H) 01/11/2022   Lab Results  Component Value Date   CHOLHDL 4.2 01/11/2022   Lab Results  Component Value Date   HGBA1C 5.7 (H) 02/08/2022      Assessment & Plan:   Assessment & Plan Hospital Discharge/Alcohol use disorder Recent relapse post-maternal death, hospitalized for detox. Currently abstinent, interested in naltrexone for cravings. Previous supplements ineffective. - Prescribed naltrexone 50 mg once daily. - Follow-up in one month to assess response and adjust dosage.  Hypertension Blood pressure 154/108 mmHg. Previously on losartan  and metoprolol . - Restart losartan . - Recheck blood pressure in one month.  Hypokalemia Hospitalization revealed potassium 3.0 mmol/L. Symptoms of weakness and shaking noted. Losartan  may aid potassium retention. - Recheck potassium levels. - Restart losartan .  - losartan  (COZAAR ) 50 MG tablet; Take 1 tablet (50 mg total) by mouth daily.  Dispense: 90 tablet; Refill: 3 - naltrexone (DEPADE) 50 MG tablet; Take 1 tablet (50 mg total) by mouth daily.  Dispense: 30 tablet; Refill: 1 - Comprehensive Metabolic Panel (CMET) - B12 and Folate Panel   Follow-up: Return in about 4 weeks (around 10/17/2024).    Sharyle Fischer, DO

## 2024-09-20 ENCOUNTER — Ambulatory Visit: Payer: Self-pay | Admitting: Internal Medicine

## 2024-09-20 LAB — B12 AND FOLATE PANEL
Folate: 12 ng/mL
Vitamin B-12: 389 pg/mL (ref 200–1100)

## 2024-09-20 LAB — COMPREHENSIVE METABOLIC PANEL WITH GFR
AG Ratio: 1.6 (calc) (ref 1.0–2.5)
ALT: 32 U/L (ref 9–46)
AST: 28 U/L (ref 10–40)
Albumin: 4.7 g/dL (ref 3.6–5.1)
Alkaline phosphatase (APISO): 94 U/L (ref 36–130)
BUN: 19 mg/dL (ref 7–25)
CO2: 27 mmol/L (ref 20–32)
Calcium: 10 mg/dL (ref 8.6–10.3)
Chloride: 102 mmol/L (ref 98–110)
Creat: 1.1 mg/dL (ref 0.60–1.29)
Globulin: 2.9 g/dL (ref 1.9–3.7)
Glucose, Bld: 118 mg/dL — ABNORMAL HIGH (ref 65–99)
Potassium: 3.9 mmol/L (ref 3.5–5.3)
Sodium: 139 mmol/L (ref 135–146)
Total Bilirubin: 0.8 mg/dL (ref 0.2–1.2)
Total Protein: 7.6 g/dL (ref 6.1–8.1)
eGFR: 87 mL/min/1.73m2 (ref >=60)

## 2024-09-24 ENCOUNTER — Other Ambulatory Visit: Payer: Self-pay

## 2024-09-24 ENCOUNTER — Emergency Department
Admission: EM | Admit: 2024-09-24 | Discharge: 2024-09-24 | Disposition: A | Discharge status: Home / Self Care | Attending: Emergency Medicine | Admitting: Emergency Medicine

## 2024-09-24 ENCOUNTER — Encounter: Payer: Self-pay | Admitting: Emergency Medicine

## 2024-09-24 DIAGNOSIS — F10930 Alcohol use, unspecified with withdrawal, uncomplicated: Secondary | ICD-10-CM

## 2024-09-24 DIAGNOSIS — F10230 Alcohol dependence with withdrawal, uncomplicated: Secondary | ICD-10-CM | POA: Diagnosis present

## 2024-09-24 DIAGNOSIS — R531 Weakness: Secondary | ICD-10-CM | POA: Diagnosis not present

## 2024-09-24 DIAGNOSIS — I10 Essential (primary) hypertension: Secondary | ICD-10-CM | POA: Insufficient documentation

## 2024-09-24 LAB — COMPREHENSIVE METABOLIC PANEL WITH GFR
ALT: 37 U/L (ref 0–44)
AST: 42 U/L — ABNORMAL HIGH (ref 15–41)
Albumin: 4.9 g/dL (ref 3.5–5.0)
Alkaline Phosphatase: 88 U/L (ref 38–126)
Anion gap: 14 (ref 5–15)
BUN: 14 mg/dL (ref 6–20)
CO2: 21 mmol/L — ABNORMAL LOW (ref 22–32)
Calcium: 9.7 mg/dL (ref 8.9–10.3)
Chloride: 103 mmol/L (ref 98–111)
Creatinine, Ser: 0.77 mg/dL (ref 0.61–1.24)
GFR, Estimated: >60 mL/min (ref >60)
Glucose, Bld: 118 mg/dL — ABNORMAL HIGH (ref 70–99)
Potassium: 3.6 mmol/L (ref 3.5–5.1)
Sodium: 138 mmol/L (ref 135–145)
Total Bilirubin: 1.2 mg/dL (ref 0.0–1.2)
Total Protein: 8.9 g/dL — ABNORMAL HIGH (ref 6.5–8.1)

## 2024-09-24 LAB — CBC
HCT: 43.3 % (ref 39.0–52.0)
Hemoglobin: 16 g/dL (ref 13.0–17.0)
MCH: 33.3 pg (ref 26.0–34.0)
MCHC: 37 g/dL — ABNORMAL HIGH (ref 30.0–36.0)
MCV: 90 fL (ref 80.0–100.0)
Platelets: 175 K/uL (ref 150–400)
RBC: 4.81 MIL/uL (ref 4.22–5.81)
RDW: 12.6 % (ref 11.5–15.5)
WBC: 4.5 K/uL (ref 4.0–10.5)
nRBC: 0 % (ref 0.0–0.2)

## 2024-09-24 LAB — ETHANOL: Alcohol, Ethyl (B): <15 mg/dL (ref <15)

## 2024-09-24 MED ORDER — LOSARTAN POTASSIUM 50 MG PO TABS
50.0000 mg | ORAL_TABLET | ORAL | Status: AC
  Administered 2024-09-24: 50 mg via ORAL
  Filled 2024-09-24: qty 1

## 2024-09-24 MED ORDER — SODIUM CHLORIDE 0.9 % IV SOLN
260.0000 mg | Freq: Once | INTRAVENOUS | Status: AC
  Administered 2024-09-24: 260 mg via INTRAVENOUS
  Filled 2024-09-24: qty 2

## 2024-09-24 NOTE — ED Notes (Signed)
 ED RN taking over care of pt at this time who reported to ED with concerns for alcohol withdrawal. Pt currently resting in hallway bed. Pt ABCs intact. RR even and unlabored. Pt in NAD. Bed in lowest locked position.   Past Medical History:  Diagnosis Date   AKI (acute kidney injury) 09/24/2022   Anxiety    ETOH abuse    Hypertension    Hypokalemia    Hypokalemia 09/24/2022   Hypomagnesemia 09/24/2022

## 2024-09-24 NOTE — Discharge Instructions (Addendum)
 You were treated for alcohol withdrawal in the emergency department.  Please follow-up closely with your primary care provider for reevaluation, return to the emergency department for new or worsening symptoms

## 2024-09-24 NOTE — ED Notes (Signed)
 Pt discharged to ED circle at this time and left with all belongings. Pt ABCs intact. RR even and unlabored. Pt in NAD. Pt denies further needs from this RN.

## 2024-09-24 NOTE — ED Notes (Signed)
 Pr refusing snack at this time. Pt calm and cooperative.

## 2024-09-24 NOTE — ED Provider Notes (Signed)
 [facility] Provider Note    Event Date/Time   First MD Initiated Contact with Patient 09/24/24 1305     (approximate)   History   Withdrawal  Patient in via POV, complaints of generalized weakness associated w/ ETOH withdrawal.  Hypertensive, tremors noted.   HPI Melvin Knight is a 43 y.o. male PMH alcohol use disorder complicated by seizures, hypertension, alcoholic liver disease presents for evaluation of concern of alcohol withdrawal -Per chart review, last admitted 09/15/2023-09/15/2024 for alcohol withdrawal.  Ultimately left AGAINST MEDICAL ADVICE. -Today, patient states his last drink was yesterday afternoon and feels that he is having alcohol withdrawal at this time.  Denies any hallucinations.  No seizures.  Says he primarily just has generalized weakness which is typical of his alcohol withdrawal symptoms.  No nausea or vomiting. -Denies any other substance use -Amenable to admission if needed today     Physical Exam   Triage Vital Signs: ED Triage Vitals  Encounter Vitals Group     BP 09/24/24 1213 (!) 171/128     Girls Systolic BP Percentile --      Girls Diastolic BP Percentile --      Boys Systolic BP Percentile --      Boys Diastolic BP Percentile --      Pulse Rate 09/24/24 1213 (!) 104     Resp 09/24/24 1213 16     Temp 09/24/24 1213 98.5 F (36.9 C)     Temp Source 09/24/24 1213 Oral     SpO2 09/24/24 1213 97 %     Weight 09/24/24 1216 215 lb (97.5 kg)     Height 09/24/24 1216 5' 7 (1.702 m)     Head Circumference --      Peak Flow --      Pain Score 09/24/24 1216 0     Pain Loc --      Pain Education --      Exclude from Growth Chart --     Most recent vital signs: Vitals:   09/24/24 1455 09/24/24 1500  BP: (!) 160/108 (!) 155/109  Pulse: 80 79  Resp: 14 11  Temp:    SpO2: 96% 96%     General: Awake, no distress. + Tremulous, + tongue wag CV:  Good peripheral perfusion. RRR (HR 90s at time of my eval), RP  2+ Resp:  Normal effort. CTAB Abd:  No distention. Nontender to deep palpation throughout Other:  Calm, cooperative, does not appear to responding to internal stimuli   ED Results / Procedures / Treatments   Labs (all labs ordered are listed, but only abnormal results are displayed) Labs Reviewed  COMPREHENSIVE METABOLIC PANEL WITH GFR - Abnormal; Notable for the following components:      Result Value   CO2 21 (*)    Glucose, Bld 118 (*)    Total Protein 8.9 (*)    AST 42 (*)    All other components within normal limits  CBC - Abnormal; Notable for the following components:   MCHC 37.0 (*)    All other components within normal limits  ETHANOL  URINE DRUG SCREEN, QUALITATIVE ([facility] ONLY)     EKG  Ecg = sinus tachycardia, rate 104, no gross ST elevation or depression, no significant repolarization abnormality beyond isolated T wave inversion in lead III (nonspecific), left axis deviation, normal intervals.  No clear evidence of ischemia or arrhythmia my interpretation.   RADIOLOGY N/a    PROCEDURES:  Critical Care performed: No  Procedures   MEDICATIONS ORDERED IN ED: Medications  PHENObarbital  (LUMINAL) 260 mg in sodium chloride  0.9 % 100 mL IVPB (0 mg Intravenous Stopped 09/24/24 1506)  losartan  (COZAAR ) tablet 50 mg (50 mg Oral Given 09/24/24 1556)     IMPRESSION / MDM / ASSESSMENT AND PLAN / ED COURSE  I reviewed the triage vital signs and the nursing notes.                              DDX/MDM/AP: Differential diagnosis includes, but is not limited to, alcohol withdrawal, presentation not consistent with other toxidrome, no evidence of delirium tremens at this time.  Do not suspect active intoxication at this time either.  Plan: - Labs - Phenobarbital  - Reassess  Patient's presentation is most consistent with acute presentation with potential threat to life or bodily function.  The patient is on the cardiac monitor to evaluate for evidence of  arrhythmia and/or significant heart rate changes.  ED course below.  Signed out to oncoming ED provider pending reevaluation, can consider further phenobarb as needed.  Has already been prescribed an antihypertensive medication by his primary provider.  Also has already been started on naltrexone.  Clinical Course as of 09/24/24 1636  Mon Sep 24, 2024  1307 Alcohol, Ethyl (B): <15 [MM]  1336 CBC reviewed, unremarkable [MM]  1336 CMP reviewed, overall unremarkable [MM]    Clinical Course User Index [MM] Clarine Ozell LABOR, MD     FINAL CLINICAL IMPRESSION(S) / ED DIAGNOSES   Final diagnoses:  Alcohol withdrawal syndrome without complication (HCC)     Rx / DC Orders   ED Discharge Orders     None        Note:  This document was prepared using Dragon voice recognition software and may include unintentional dictation errors.   Clarine Ozell LABOR, MD 09/24/24 [id number]

## 2024-09-24 NOTE — ED Notes (Addendum)
 Pt reports he last took prescribed naltrexone 50 mg this AM. Last drink 09/23/24 at 1500. Pt states he usually have 4-5 hard liquor drinks daily.

## 2024-09-24 NOTE — ED Provider Notes (Signed)
 Vitals:   09/24/24 2004 09/24/24 2010  BP: (!) 152/108 (!) 152/108  Pulse: 85 81  Resp:  18  Temp:  99.2 F (37.3 C)  SpO2:  96%     Resting comfortably.  No longer noted to be tremulous not diaphoretic.  Patient feels much improved.  He is comfortable plan for discharge, has family coming to pick him up.  Has a plan in place to follow-up closely with primary care for alcohol abuse and counseling.  He is alert well-oriented not confused and not in distress.  Patient requesting discharge as well reporting he feels much better he appears well and appropriate for discharge at this time not driving  Return precautions and treatment recommendations and follow-up discussed with the patient who is agreeable with the plan.    Dicky Anes, MD 09/24/24 2115

## 2024-09-24 NOTE — ED Notes (Signed)
 Pt resting comfortably in bed at this time. No signs of withdrawal. Pt ABCs intact. RR even and unlabored. Pt in NAD. Bed in lowest locked position. Denies needs at this time.

## 2024-09-24 NOTE — ED Triage Notes (Signed)
 Patient in via POV, complaints of generalized weakness associated w/ ETOH withdrawal.  Hypertensive, tremors noted.

## 2024-09-24 NOTE — ED Notes (Signed)
 Phenobarbital  infusion received from pharmacy tech.

## 2024-10-17 ENCOUNTER — Ambulatory Visit: Admitting: Internal Medicine

## 2024-10-17 ENCOUNTER — Encounter: Payer: Self-pay | Admitting: Internal Medicine

## 2024-10-17 ENCOUNTER — Other Ambulatory Visit: Payer: Self-pay

## 2024-10-17 VITALS — BP 132/80 | HR 88 | Temp 98.4°F | Resp 16 | Ht 67.0 in | Wt 217.0 lb

## 2024-10-17 DIAGNOSIS — I10 Essential (primary) hypertension: Secondary | ICD-10-CM

## 2024-10-17 DIAGNOSIS — F1020 Alcohol dependence, uncomplicated: Secondary | ICD-10-CM

## 2024-10-17 DIAGNOSIS — Z23 Encounter for immunization: Secondary | ICD-10-CM

## 2024-10-17 MED ORDER — NALTREXONE HCL 50 MG PO TABS: 100.0000 mg | ORAL_TABLET | Freq: Every day | ORAL | 1 refills | Status: DC

## 2024-10-17 NOTE — Progress Notes (Signed)
 Established Patient Office Visit  Subjective:  Patient ID: Connie Hilgert, male    DOB: [birth year]  Age: 43 y.o. MRN: [id number]  CC:  Chief Complaint  Patient presents with   Follow-up    4 week recheck, hospital visit and med check    HPI Blong Busk presents for follow up on chronic medical conditions.   Discussed the use of AI scribe software for clinical note transcription with the patient, who gave verbal consent to proceed.  History of Present Illness Alamin Mccuiston is a 43 year old male with alcohol use disorder who presents for follow-up after an ER visit for alcohol withdrawal.  He was treated in the ER for alcohol withdrawal on November 3 and resumed drinking soon after. He stopped drinking on Sunday, November 23 and has been sober since, with shaking and weakness significant enough to miss his soccer league on Monday. He links his drinking to boredom and living alone.  He takes naltrexone , increased from a lower dose, and has not noticed clear benefit. He had dizziness when starting it, which improved when he switched dosing to nighttime.  He currently uses Medicaid for coverage.  He takes potassium supplements thought to be from a prior hospitalization but is unsure of the dose and plans to confirm. He also takes a multivitamin and losartan  for blood pressure.  He is hosting Thanksgiving and a birthday celebration with visiting family who do not drink heavily, which he feels supports his efforts to stay sober.   Hypertension: -Medications: Restarted Losartan  50 mg at LOV, had been on Hydralazine  50 mg once daily and Metoprolol   -Denies any SOB, CP, vision changes, LE edema but does occasionally get lightheaded.   Elevated LFTs/Alcohol Use Disorder: -Liver function 10/25 AST 47, ALT 37, alk phos 90 -Liver US  10/24 showing fatty liver -States last drink was 3 days ago -Started on Naltrexone  50 mg at LOV, states dizziness at first but improved however no  benefits yet  Past Medical History:  Diagnosis Date   AKI (acute kidney injury) 09/24/2022   Anxiety    ETOH abuse    Hypertension    Hypokalemia    Hypokalemia 09/24/2022   Hypomagnesemia 09/24/2022    No past surgical history on file.  Family History  Problem Relation Age of Onset   Hypertension Father    Stroke Father    Cancer Sister     Social History   Socioeconomic History   Marital status: Single    Spouse name: Not on file   Number of children: Not on file   Years of education: Not on file   Highest education level: Bachelor's degree (e.g., BA, AB, BS)  Occupational History   Not on file  Tobacco Use   Smoking status: Every Day    Current packs/day: 1.00    Types: Cigarettes   Smokeless tobacco: Never  Vaping Use   Vaping status: Never Used  Substance and Sexual Activity   Alcohol use: Yes    Alcohol/week: 6.0 standard drinks of alcohol    Types: 6 Shots of liquor per week   Drug use: Not Currently   Sexual activity: Not Currently  Other Topics Concern   Not on file  Social History Narrative   Right handed    Social Drivers of Health   Financial Resource Strain: Low Risk  (03/16/2023)   Overall Financial Resource Strain (CARDIA)    Difficulty of Paying Living Expenses: Not hard at all  Food Insecurity: No Food  Insecurity (09/15/2024)   Hunger Vital Sign    Worried About Running Out of Food in the Last Year: Never true    Ran Out of Food in the Last Year: Never true  Transportation Needs: No Transportation Needs (09/15/2024)   PRAPARE - Administrator, Civil Service (Medical): No    Lack of Transportation (Non-Medical): No  Physical Activity: Sufficiently Active (03/16/2023)   Exercise Vital Sign    Days of Exercise per Week: 3 days    Minutes of Exercise per Session: 150+ min  Stress: No Stress Concern Present (03/16/2023)   [employer] of Occupational Health - Occupational Stress Questionnaire    Feeling of Stress : Not at  all  Social Connections: Socially Isolated (01/03/2024)   Social Connection and Isolation Panel    Frequency of Communication with Friends and Family: More than three times a week    Frequency of Social Gatherings with Friends and Family: Twice a week    Attends Religious Services: Never    Database Administrator or Organizations: No    Attends Banker Meetings: Never    Marital Status: Never married  Intimate Partner Violence: Not At Risk (09/15/2024)   Humiliation, Afraid, Rape, and Kick questionnaire    Fear of Current or Ex-Partner: No    Emotionally Abused: No    Physically Abused: No    Sexually Abused: No    Outpatient Medications Prior to Visit  Medication Sig Dispense Refill   Multiple Vitamins-Minerals (MULTIVITAMIN WITH MINERALS) tablet Take 1 tablet by mouth daily. 90 tablet 3   naltrexone  (DEPADE) 50 MG tablet Take 1 tablet (50 mg total) by mouth daily. 30 tablet 1   folic acid  (FOLVITE ) 1 MG tablet Take 1 tablet (1 mg total) by mouth daily. (Patient not taking: Reported on 09/19/2024) 30 tablet 2   hydrALAZINE  (APRESOLINE ) 50 MG tablet Take 1 tablet (50 mg total) by mouth 2 (two) times daily. If systolic BP greater than 150 mmHg. (Patient not taking: Reported on 09/19/2024)     losartan  (COZAAR ) 50 MG tablet Take 1 tablet (50 mg total) by mouth daily. (Patient not taking: Reported on 10/17/2024) 90 tablet 3   Magnesium  400 MG TABS Take 1 tablet by mouth 2 (two) times daily. (Patient not taking: Reported on 09/19/2024)     metoprolol  succinate (TOPROL -XL) 25 MG 24 hr tablet Take 12.5 mg by mouth every morning. (Patient not taking: Reported on 09/19/2024)     pantoprazole  (PROTONIX ) 40 MG tablet Take 1 tablet (40 mg total) by mouth daily. (Patient not taking: Reported on 09/19/2024) 14 tablet 0   thiamine  (VITAMIN B1) 100 MG tablet Take 100 mg by mouth every morning. (Patient not taking: Reported on 09/19/2024)     vitamin B-12 (CYANOCOBALAMIN ) 100 MCG tablet Take  100 mcg by mouth daily. (Patient not taking: Reported on 09/19/2024)     No facility-administered medications prior to visit.    No Known Allergies  ROS Review of Systems  Cardiovascular:  Negative for palpitations.  Neurological:  Positive for tremors and weakness. Negative for numbness.  All other systems reviewed and are negative.     Objective:    Physical Exam Constitutional:      Appearance: Normal appearance.  HENT:     Head: Normocephalic and atraumatic.  Eyes:     Conjunctiva/sclera: Conjunctivae normal.  Cardiovascular:     Rate and Rhythm: Regular rhythm. Tachycardia present.  Pulmonary:     Effort: Pulmonary effort is  normal.     Breath sounds: Normal breath sounds.  Skin:    General: Skin is warm and dry.  Neurological:     General: No focal deficit present.     Mental Status: He is alert. Mental status is at baseline.  Psychiatric:        Mood and Affect: Mood normal.     BP 132/80 (Cuff Size: Large)   Pulse 88   Temp 98.4 F (36.9 C) (Oral)   Resp 16   Ht 5' 7 (1.702 m)   Wt 217 lb (98.4 kg)   SpO2 92%   BMI 33.99 kg/m  Wt Readings from Last 3 Encounters:  10/17/24 217 lb (98.4 kg)  09/24/24 215 lb (97.5 kg)  09/19/24 215 lb 14.4 oz (97.9 kg)   Vitals:   10/17/24 0935  BP: 132/80    Health Maintenance Due  Topic Date Due   Pneumococcal Vaccine (1 of 2 - PCV) Never done   Hepatitis B Vaccines 19-59 Average Risk (1 of 3 - 19+ 3-dose series) Never done   HPV VACCINES (1 - 3-dose SCDM series) Never done   COVID-19 Vaccine (2 - Janssen risk series) 03/01/2020   Influenza Vaccine  06/22/2024        Topic Date Due   Hepatitis B Vaccines 19-59 Average Risk (1 of 3 - 19+ 3-dose series) Never done   HPV VACCINES (1 - 3-dose SCDM series) Never done    Lab Results  Component Value Date   TSH 1.562 04/18/2023   Lab Results  Component Value Date   WBC 4.5 09/24/2024   HGB 16.0 09/24/2024   HCT 43.3 09/24/2024   MCV 90.0 09/24/2024    PLT 175 09/24/2024   Lab Results  Component Value Date   NA 138 09/24/2024   K 3.6 09/24/2024   CO2 21 (L) 09/24/2024   GLUCOSE 118 (H) 09/24/2024   BUN 14 09/24/2024   CREATININE 0.77 09/24/2024   BILITOT 1.2 09/24/2024   ALKPHOS 88 09/24/2024   AST 42 (H) 09/24/2024   ALT 37 09/24/2024   PROT 8.9 (H) 09/24/2024   ALBUMIN 4.9 09/24/2024   CALCIUM  9.7 09/24/2024   ANIONGAP 14 09/24/2024   EGFR 87 09/19/2024   Lab Results  Component Value Date   CHOL 258 (H) 01/11/2022   Lab Results  Component Value Date   HDL 61 01/11/2022   Lab Results  Component Value Date   LDLCALC 166 (H) 01/11/2022   Lab Results  Component Value Date   TRIG 162 (H) 01/11/2022   Lab Results  Component Value Date   CHOLHDL 4.2 01/11/2022   Lab Results  Component Value Date   HGBA1C 5.7 (H) 02/08/2022      Assessment & Plan:   Assessment & Plan  Alcohol dependence Recent withdrawal symptoms and ER visit. Abstinent for four days. Previous naltrexone  dose ineffective. Discussed Vivitrol  injections as future option, not available here. Emphasized addressing boredom and alternative activities for cravings. - Increased naltrexone  to 100 mg daily at bedtime. - Discussed potential for Vivitrol  injections at another clinic. - Encouraged alternative activities for boredom and cravings.  Hypertension Well-controlled with losartan . Recent blood pressure readings improved. Discussed potential confusion with potassium supplementation related to losartan  potassium. - Continue losartan  as prescribed. - Verify current medications and confirm potassium supplementation status.  Immunizations (influenza and pneumococcal) Due for influenza and pneumococcal vaccinations. Discussed importance of pneumococcal vaccine in reducing severe pneumonia risk. - Administered influenza vaccine.  - naltrexone  (  DEPADE) 50 MG tablet; Take 2 tablets (100 mg total) by mouth daily.  Dispense: 60 tablet; Refill: 1 -  Flu vaccine trivalent PF, 6mos and older(Flulaval,Afluria,Fluarix,Fluzone ) - Pneumococcal conjugate vaccine 20-valent (Prevnar 20)  Follow-up: Return in about 4 weeks (around 11/14/2024).    Sharyle Fischer, DO

## 2024-10-22 ENCOUNTER — Emergency Department
Admission: EM | Admit: 2024-10-22 | Discharge: 2024-10-22 | Disposition: A | Discharge status: Home / Self Care | Attending: Emergency Medicine | Admitting: Emergency Medicine

## 2024-10-22 DIAGNOSIS — R251 Tremor, unspecified: Secondary | ICD-10-CM | POA: Diagnosis present

## 2024-10-22 DIAGNOSIS — F10930 Alcohol use, unspecified with withdrawal, uncomplicated: Secondary | ICD-10-CM

## 2024-10-22 DIAGNOSIS — F10139 Alcohol abuse with withdrawal, unspecified: Secondary | ICD-10-CM | POA: Insufficient documentation

## 2024-10-22 LAB — COMPREHENSIVE METABOLIC PANEL WITH GFR
ALT: 46 U/L — ABNORMAL HIGH (ref 0–44)
AST: 58 U/L — ABNORMAL HIGH (ref 15–41)
Albumin: 4.7 g/dL (ref 3.5–5.0)
Alkaline Phosphatase: 111 U/L (ref 38–126)
Anion gap: 15 (ref 5–15)
BUN: 10 mg/dL (ref 6–20)
CO2: 24 mmol/L (ref 22–32)
Calcium: 9.4 mg/dL (ref 8.9–10.3)
Chloride: 98 mmol/L (ref 98–111)
Creatinine, Ser: 0.8 mg/dL (ref 0.61–1.24)
GFR, Estimated: >60 mL/min (ref >60)
Glucose, Bld: 113 mg/dL — ABNORMAL HIGH (ref 70–99)
Potassium: 3.4 mmol/L — ABNORMAL LOW (ref 3.5–5.1)
Sodium: 136 mmol/L (ref 135–145)
Total Bilirubin: 0.7 mg/dL (ref 0.0–1.2)
Total Protein: 7.9 g/dL (ref 6.5–8.1)

## 2024-10-22 LAB — CBC
HCT: 41 % (ref 39.0–52.0)
Hemoglobin: 15 g/dL (ref 13.0–17.0)
MCH: 33.9 pg (ref 26.0–34.0)
MCHC: 36.6 g/dL — ABNORMAL HIGH (ref 30.0–36.0)
MCV: 92.6 fL (ref 80.0–100.0)
Platelets: 145 K/uL — ABNORMAL LOW (ref 150–400)
RBC: 4.43 MIL/uL (ref 4.22–5.81)
RDW: 12.9 % (ref 11.5–15.5)
WBC: 3.7 K/uL — ABNORMAL LOW (ref 4.0–10.5)
nRBC: 0 % (ref 0.0–0.2)

## 2024-10-22 LAB — ETHANOL: Alcohol, Ethyl (B): <15 mg/dL (ref <15)

## 2024-10-22 MED ORDER — PHENOBARBITAL 32.4 MG PO TABS: ORAL_TABLET | ORAL | 0 refills | Status: DC

## 2024-10-22 MED ORDER — SODIUM CHLORIDE 0.9 % IV SOLN
1000.0000 mg | INTRAVENOUS | Status: AC
  Administered 2024-10-22: 1000 mg via INTRAVENOUS
  Filled 2024-10-22: qty 7.69

## 2024-10-22 NOTE — ED Provider Notes (Signed)
 [facility] Provider Note    Event Date/Time   First MD Initiated Contact with Patient 10/22/24 1714     (approximate)   History   Withdrawal   HPI  Melvin Knight is a 43 y.o. male with a history of alcohol abuse and dependence who comes ED complaining of tremor, anxiousness, generalized weakness and unsteadiness on his feet which he identifies as typical symptoms of his alcohol withdrawal syndrome.  He has been through periods of alcohol abstinence in the past, but tends to relapse due to living alone and being bored.  States that lately he has been drinking heavily at least every other day, last drink was yesterday.  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  Denies fever or pain.  No hematemesis or melanotic stool      Physical Exam   Triage Vital Signs: ED Triage Vitals  Encounter Vitals Group     BP 10/22/24 1520 (!) 172/121     Girls Systolic BP Percentile --      Girls Diastolic BP Percentile --      Boys Systolic BP Percentile --      Boys Diastolic BP Percentile --      Pulse Rate 10/22/24 1520 96     Resp 10/22/24 1520 18     Temp 10/22/24 1520 99.8 F (37.7 C)     Temp Source 10/22/24 1520 Oral     SpO2 10/22/24 1520 95 %     Weight 10/22/24 1522 216 lb 0.8 oz (98 kg)     Height 10/22/24 1522 5' 7 (1.702 m)     Head Circumference --      Peak Flow --      Pain Score 10/22/24 1521 0     Pain Loc --      Pain Education --      Exclude from Growth Chart --     Most recent vital signs: Vitals:   10/22/24 1753 10/22/24 1828  BP: (!) 172/121 (!) 182/120  Pulse: 96 82  Resp:  18  Temp:    SpO2:  95%    General: Awake, no distress.  CV:  Good peripheral perfusion.  Mild tachycardia rate 95.  Normal distal pulses Resp:  Normal effort.  Clear lungs Abd:  No distention.  Soft nontender Other:  Moist oral mucosa   ED Results / Procedures / Treatments   Labs (all labs ordered are listed, but only  abnormal results are displayed) Labs Reviewed  COMPREHENSIVE METABOLIC PANEL WITH GFR - Abnormal; Notable for the following components:      Result Value   Potassium 3.4 (*)    Glucose, Bld 113 (*)    AST 58 (*)    ALT 46 (*)    All other components within normal limits  CBC - Abnormal; Notable for the following components:   WBC 3.7 (*)    MCHC 36.6 (*)    Platelets 145 (*)    All other components within normal limits  ETHANOL  URINE DRUG SCREEN     RADIOLOGY    PROCEDURES:  Procedures   MEDICATIONS ORDERED IN ED: Medications  PHENObarbital  (LUMINAL) 1,000 mg in sodium chloride  0.9 % 100 mL IVPB (0 mg Intravenous Stopped 10/22/24 1838)     IMPRESSION / MDM / ASSESSMENT AND PLAN / ED COURSE  I reviewed the triage vital signs and the nursing notes.  Differential diagnosis includes, but is not limited to, electrolyte derangement, anemia, AKI, alcohol withdrawal  Patient presents with typical symptoms of his alcohol withdrawal syndrome.  Does have elevated blood pressure and heart rate.  Doubt sepsis or significant infection.  Patient given 10 mg/kg IV load of phenobarbital .  Afterward, he reports resolution of his symptoms.  He is standing upright and ambulating with steady gait.  Tolerating oral intake, clear speech, lucid.  Clinically sober and without signs or symptoms of withdrawal.  Will continue oral phenobarb taper.  He will continue following up with his doctor who is also helping to manage his alcohol use disorder.       FINAL CLINICAL IMPRESSION(S) / ED DIAGNOSES   Final diagnoses:  Alcohol withdrawal syndrome without complication (HCC)     Rx / DC Orders   ED Discharge Orders          Ordered    PHENobarbital  (LUMINAL) 32.4 MG tablet  Q8H       Note to Pharmacy: May substitute with 30mg  tablet if 32.4mg  tablet is unavailable   10/22/24 1956             Note:  This document was prepared using Dragon voice  recognition software and may include unintentional dictation errors.   Viviann Pastor, MD 10/22/24 2001

## 2024-10-22 NOTE — ED Triage Notes (Signed)
 Pt presents to the ED via POV with complaints of generalized weakness from ETOH withdrawal. Pt states that this happens when he goes through withdrawals. Was seen here last for same on 11/3. Pt is hypertensive with hx of same. Tremors noted. Pt reports last drink was yesterday. Typically drinks 4-5 straight liquor drinks a day. Noted to be about 6-7 shots each.

## 2024-11-19 ENCOUNTER — Ambulatory Visit: Admitting: Internal Medicine

## 2024-11-19 ENCOUNTER — Other Ambulatory Visit: Payer: Self-pay

## 2024-11-19 ENCOUNTER — Encounter: Payer: Self-pay | Admitting: Internal Medicine

## 2024-11-19 VITALS — BP 156/88 | HR 100 | Temp 98.5°F | Resp 18 | Ht 67.0 in | Wt 219.1 lb

## 2024-11-19 DIAGNOSIS — Z13220 Encounter for screening for lipoid disorders: Secondary | ICD-10-CM

## 2024-11-19 DIAGNOSIS — F1020 Alcohol dependence, uncomplicated: Secondary | ICD-10-CM

## 2024-11-19 DIAGNOSIS — I10 Essential (primary) hypertension: Secondary | ICD-10-CM

## 2024-11-19 MED ORDER — LOSARTAN POTASSIUM 50 MG PO TABS: 50.0000 mg | ORAL_TABLET | Freq: Every day | ORAL | 3 refills | Status: AC

## 2024-11-19 NOTE — Progress Notes (Signed)
 "  Established Patient Office Visit  Subjective:  Patient ID: Melvin Knight, male    DOB: 1981/02/16  Age: 43 y.o. MRN: [id number]  CC:  Chief Complaint  Patient presents with   Follow-up    4 week recheck seen in ER    HPI Melvin Knight presents for follow up on chronic medical conditions.   Discussed the use of AI scribe software for clinical note transcription with the patient, who gave verbal consent to proceed.  History of Present Illness  Melvin Knight is a 43 year old male with alcohol use disorder who presents with weakness and recent alcohol consumption.  He was hospitalized on December 1st for six hours, during which his alcohol-use medication was changed from naltrexone  to phenobarbital . He had dizziness the first week on naltrexone  that resolved, but he felt it did not reduce cravings. He feels phenobarbital  is also not helping his alcohol use.  He feels weak today after feeling well for the past four days. He drank alcohol yesterday after abstaining during the holidays. He links the relapse to emotional stress from his first Christmas without his mother. His family members are non-drinkers, and he tends to drink less when he is with them.  He is actively applying for jobs but has not had interviews, which he finds discouraging.  He is not taking any medications other than multivitamins. He recalls prior blood pressure medication that was stopped when he was sober and his blood pressure improved.  He can go days without eating and has not eaten since yesterday afternoon, with only Gatorade today. During his last hospital visit he was told his potassium was low and his liver enzymes were elevated.  He is not currently taking pantoprazole , which was previously prescribed for acid reflux, and he does not recall the original indication.   Hypertension: -Medications: Restarted Losartan  50 mg at LOV but he isn't taking  -Denies any SOB, CP, vision changes, LE edema but  does occasionally get lightheaded.   Elevated LFTs/Alcohol Use Disorder: -Liver function 12/25 AST 58, ALT 46, alk phos 11 -Liver US  10/24 showing fatty liver -States last drink was yesterday -Increased Naltrexone  to 100 mg at LOV but states it didn't work so he stopped taking. When at the ER was given Phenobarbital  taper but this did nothing either.   Past Medical History:  Diagnosis Date   AKI (acute kidney injury) 09/24/2022   Anxiety    ETOH abuse    Hypertension    Hypokalemia    Hypokalemia 09/24/2022   Hypomagnesemia 09/24/2022    No past surgical history on file.  Family History  Problem Relation Age of Onset   Hypertension Father    Stroke Father    Cancer Sister     Social History   Socioeconomic History   Marital status: Single    Spouse name: Not on file   Number of children: Not on file   Years of education: Not on file   Highest education level: Bachelor's degree (e.g., BA, AB, BS)  Occupational History   Not on file  Tobacco Use   Smoking status: Every Day    Current packs/day: 1.00    Types: Cigarettes   Smokeless tobacco: Never  Vaping Use   Vaping status: Never Used  Substance and Sexual Activity   Alcohol use: Yes    Alcohol/week: 6.0 standard drinks of alcohol    Types: 6 Shots of liquor per week   Drug use: Not Currently   Sexual activity:  Not Currently  Other Topics Concern   Not on file  Social History Narrative   Right handed    Social Drivers of Health   Tobacco Use: High Risk (11/19/2024)   Patient History    Smoking Tobacco Use: Every Day    Smokeless Tobacco Use: Never    Passive Exposure: Not on file  Financial Resource Strain: Low Risk (03/16/2023)   Overall Financial Resource Strain (CARDIA)    Difficulty of Paying Living Expenses: Not hard at all  Food Insecurity: No Food Insecurity (09/15/2024)   Epic    Worried About Programme Researcher, Broadcasting/film/video in the Last Year: Never true    Ran Out of Food in the Last Year: Never true   Transportation Needs: No Transportation Needs (09/15/2024)   Epic    Lack of Transportation (Medical): No    Lack of Transportation (Non-Medical): No  Physical Activity: Sufficiently Active (03/16/2023)   Exercise Vital Sign    Days of Exercise per Week: 3 days    Minutes of Exercise per Session: 150+ min  Stress: No Stress Concern Present (03/16/2023)   [employer] of Occupational Health - Occupational Stress Questionnaire    Feeling of Stress : Not at all  Social Connections: Socially Isolated (01/03/2024)   Social Connection and Isolation Panel    Frequency of Communication with Friends and Family: More than three times a week    Frequency of Social Gatherings with Friends and Family: Twice a week    Attends Religious Services: Never    Database Administrator or Organizations: No    Attends Banker Meetings: Never    Marital Status: Never married  Intimate Partner Violence: Not At Risk (09/15/2024)   Epic    Fear of Current or Ex-Partner: No    Emotionally Abused: No    Physically Abused: No    Sexually Abused: No  Depression (PHQ2-9): Low Risk (09/19/2024)   Depression (PHQ2-9)    PHQ-2 Score: 4  Alcohol Screen: Low Risk (03/17/2023)   Alcohol Screen    Last Alcohol Screening Score (AUDIT): 0  Housing: Low Risk (09/15/2024)   Epic    Unable to Pay for Housing in the Last Year: No    Number of Times Moved in the Last Year: 0    Homeless in the Last Year: No  Utilities: Not At Risk (09/15/2024)   Epic    Threatened with loss of utilities: No  Health Literacy: Not on file    Outpatient Medications Prior to Visit  Medication Sig Dispense Refill   Multiple Vitamins-Minerals (MULTIVITAMIN WITH MINERALS) tablet Take 1 tablet by mouth daily. 90 tablet 3   folic acid  (FOLVITE ) 1 MG tablet Take 1 tablet (1 mg total) by mouth daily. (Patient not taking: Reported on 11/19/2024) 30 tablet 2   hydrALAZINE  (APRESOLINE ) 50 MG tablet Take 1 tablet (50 mg total) by  mouth 2 (two) times daily. If systolic BP greater than 150 mmHg. (Patient not taking: Reported on 11/19/2024)     losartan  (COZAAR ) 50 MG tablet Take 1 tablet (50 mg total) by mouth daily. (Patient not taking: Reported on 11/19/2024) 90 tablet 3   Magnesium  400 MG TABS Take 1 tablet by mouth 2 (two) times daily. (Patient not taking: Reported on 11/19/2024)     metoprolol  succinate (TOPROL -XL) 25 MG 24 hr tablet Take 12.5 mg by mouth every morning. (Patient not taking: Reported on 11/19/2024)     naltrexone  (DEPADE) 50 MG tablet Take 2 tablets (100  mg total) by mouth daily. (Patient not taking: Reported on 11/19/2024) 60 tablet 1   pantoprazole  (PROTONIX ) 40 MG tablet Take 1 tablet (40 mg total) by mouth daily. (Patient not taking: Reported on 11/19/2024) 14 tablet 0   PHENobarbital  (LUMINAL) 32.4 MG tablet Take 3 tablets (97.2 mg total) by mouth every 8 (eight) hours for 2 days, THEN 2 tablets (64.8 mg total) every 8 (eight) hours for 2 days, THEN 1 tablet (32.4 mg total) every 8 (eight) hours for 2 days. 97.2mg  PO TID x 6 doses, then 64.8mg  PO TID x 6 doses, then 32.4mg  PO TID x 6 doses. (Patient not taking: No sig reported) 36 tablet 0   thiamine  (VITAMIN B1) 100 MG tablet Take 100 mg by mouth every morning. (Patient not taking: Reported on 11/19/2024)     vitamin B-12 (CYANOCOBALAMIN ) 100 MCG tablet Take 100 mcg by mouth daily. (Patient not taking: Reported on 11/19/2024)     No facility-administered medications prior to visit.    No Known Allergies  ROS Review of Systems  Cardiovascular:  Negative for palpitations.  Neurological:  Positive for tremors and weakness. Negative for numbness.  All other systems reviewed and are negative.     Objective:    Physical Exam Constitutional:      Appearance: Normal appearance.  HENT:     Head: Normocephalic and atraumatic.  Eyes:     Conjunctiva/sclera: Conjunctivae normal.  Cardiovascular:     Rate and Rhythm: Normal rate and regular  rhythm.  Pulmonary:     Effort: Pulmonary effort is normal.     Breath sounds: Normal breath sounds.  Skin:    General: Skin is warm and dry.  Neurological:     General: No focal deficit present.     Mental Status: He is alert. Mental status is at baseline.  Psychiatric:        Mood and Affect: Mood normal.     BP (!) 156/88   Pulse 100   Temp 98.5 F (36.9 C) (Other (Comment))   Resp 18   Ht 5' 7 (1.702 m)   Wt 219 lb 1.6 oz (99.4 kg)   SpO2 96%   BMI 34.32 kg/m  Wt Readings from Last 3 Encounters:  11/19/24 219 lb 1.6 oz (99.4 kg)  10/22/24 216 lb 0.8 oz (98 kg)  10/17/24 217 lb (98.4 kg)   Vitals:   11/19/24 1043  BP: (!) 154/86    Health Maintenance Due  Topic Date Due   Hepatitis B Vaccines 19-59 Average Risk (1 of 3 - 19+ 3-dose series) Never done   HPV VACCINES (1 - 3-dose SCDM series) Never done   COVID-19 Vaccine (2 - Janssen risk series) 03/01/2020        Topic Date Due   Hepatitis B Vaccines 19-59 Average Risk (1 of 3 - 19+ 3-dose series) Never done   HPV VACCINES (1 - 3-dose SCDM series) Never done    Lab Results  Component Value Date   TSH 1.562 04/18/2023   Lab Results  Component Value Date   WBC 3.7 (L) 10/22/2024   HGB 15.0 10/22/2024   HCT 41.0 10/22/2024   MCV 92.6 10/22/2024   PLT 145 (L) 10/22/2024   Lab Results  Component Value Date   NA 136 10/22/2024   K 3.4 (L) 10/22/2024   CO2 24 10/22/2024   GLUCOSE 113 (H) 10/22/2024   BUN 10 10/22/2024   CREATININE 0.80 10/22/2024   BILITOT 0.7 10/22/2024   ALKPHOS  111 10/22/2024   AST 58 (H) 10/22/2024   ALT 46 (H) 10/22/2024   PROT 7.9 10/22/2024   ALBUMIN 4.7 10/22/2024   CALCIUM  9.4 10/22/2024   ANIONGAP 15 10/22/2024   EGFR 87 09/19/2024   Lab Results  Component Value Date   CHOL 258 (H) 01/11/2022   Lab Results  Component Value Date   HDL 61 01/11/2022   Lab Results  Component Value Date   LDLCALC 166 (H) 01/11/2022   Lab Results  Component Value Date   TRIG  162 (H) 01/11/2022   Lab Results  Component Value Date   CHOLHDL 4.2 01/11/2022   Lab Results  Component Value Date   HGBA1C 5.7 (H) 02/08/2022      Assessment & Plan:   Assessment & Plan  Alcohol use disorder, severe, dependence Recent hospitalization on December 1st. Naltrexone  ineffective, phenobarbital  not helpful. Recent alcohol use due to emotional stressors. Weakness possibly related to alcohol. Previous labs showed low potassium and elevated liver enzymes. Emphasized sobriety to prevent liver damage and improve health. - Referred to [facility] for further evaluation and management. - Ordered labs to check potassium levels and liver function. - Encouraged sobriety and discussed importance of stopping alcohol consumption.  Essential hypertension Blood pressure elevated. Previously managed with losartan , discontinued during sobriety. Discussed potential need for medication based on alcohol use and blood pressure control. Losartan  chosen for potassium-sparing effect. - Prescribed losartan  to manage blood pressure and support potassium levels.  General health maintenance Continued use of multivitamins to prevent neurologic symptoms related to alcohol use. - Continue multivitamins, including folic acid  and vitamin B vitamins.  - Ambulatory referral to Psychiatry - losartan  (COZAAR ) 50 MG tablet; Take 1 tablet (50 mg total) by mouth daily.  Dispense: 90 tablet; Refill: 3 - Comprehensive Metabolic Panel (CMET) - CBC w/Diff/Platelet - Lipid Profile   Follow-up: Return in about 3 months (around 02/17/2025).    Sharyle Fischer, DO "

## 2024-11-20 ENCOUNTER — Ambulatory Visit: Payer: Self-pay | Admitting: Internal Medicine

## 2024-11-20 DIAGNOSIS — E782 Mixed hyperlipidemia: Secondary | ICD-10-CM

## 2024-11-20 DIAGNOSIS — E781 Pure hyperglyceridemia: Secondary | ICD-10-CM

## 2024-11-20 LAB — COMPREHENSIVE METABOLIC PANEL WITH GFR
AG Ratio: 1.5 (calc) (ref 1.0–2.5)
ALT: 34 U/L (ref 9–46)
AST: 36 U/L (ref 10–40)
Albumin: 4.8 g/dL (ref 3.6–5.1)
Alkaline phosphatase (APISO): 100 U/L (ref 36–130)
BUN: 10 mg/dL (ref 7–25)
CO2: 22 mmol/L (ref 20–32)
Calcium: 9.5 mg/dL (ref 8.6–10.3)
Chloride: 106 mmol/L (ref 98–110)
Creat: 0.76 mg/dL (ref 0.60–1.29)
Globulin: 3.1 g/dL (ref 1.9–3.7)
Glucose, Bld: 97 mg/dL (ref 65–139)
Potassium: 3.6 mmol/L (ref 3.5–5.3)
Sodium: 141 mmol/L (ref 135–146)
Total Bilirubin: 0.5 mg/dL (ref 0.2–1.2)
Total Protein: 7.9 g/dL (ref 6.1–8.1)
eGFR: 116 mL/min/1.73m2

## 2024-11-20 LAB — CBC WITH DIFFERENTIAL/PLATELET
Absolute Lymphocytes: 1329 {cells}/uL (ref 850–3900)
Absolute Monocytes: 474 {cells}/uL (ref 200–950)
Basophils Absolute: 101 {cells}/uL (ref 0–200)
Basophils Relative: 2.2 %
Eosinophils Absolute: 51 {cells}/uL (ref 15–500)
Eosinophils Relative: 1.1 %
HCT: 45.6 % (ref 39.4–51.1)
Hemoglobin: 15.8 g/dL (ref 13.2–17.1)
MCH: 34.6 pg — ABNORMAL HIGH (ref 27.0–33.0)
MCHC: 34.6 g/dL (ref 31.6–35.4)
MCV: 100 fL (ref 81.4–101.7)
MPV: 9.9 fL (ref 7.5–12.5)
Monocytes Relative: 10.3 %
Neutro Abs: 2645 {cells}/uL (ref 1500–7800)
Neutrophils Relative %: 57.5 %
Platelets: 183 Thousand/uL (ref 140–400)
RBC: 4.56 Million/uL (ref 4.20–5.80)
RDW: 14.5 % (ref 11.0–15.0)
Total Lymphocyte: 28.9 %
WBC: 4.6 Thousand/uL (ref 3.8–10.8)

## 2024-11-20 LAB — LIPID PANEL
Cholesterol: 292 mg/dL — ABNORMAL HIGH
HDL: 85 mg/dL
LDL Cholesterol (Calc): 153 mg/dL — ABNORMAL HIGH
Non-HDL Cholesterol (Calc): 207 mg/dL — ABNORMAL HIGH
Total CHOL/HDL Ratio: 3.4 (calc)
Triglycerides: 348 mg/dL — ABNORMAL HIGH

## 2024-11-20 MED ORDER — FENOFIBRATE 48 MG PO TABS: 48.0000 mg | ORAL_TABLET | Freq: Every day | ORAL | 3 refills | Status: AC

## 2024-12-03 ENCOUNTER — Emergency Department
Admission: EM | Admit: 2024-12-03 | Discharge: 2024-12-03 | Disposition: A | Discharge status: Home / Self Care | Attending: Emergency Medicine | Admitting: Emergency Medicine

## 2024-12-03 ENCOUNTER — Encounter: Payer: Self-pay | Admitting: Emergency Medicine

## 2024-12-03 ENCOUNTER — Other Ambulatory Visit: Payer: Self-pay

## 2024-12-03 DIAGNOSIS — Y909 Presence of alcohol in blood, level not specified: Secondary | ICD-10-CM | POA: Insufficient documentation

## 2024-12-03 DIAGNOSIS — R000 Tachycardia, unspecified: Secondary | ICD-10-CM | POA: Diagnosis not present

## 2024-12-03 DIAGNOSIS — F10239 Alcohol dependence with withdrawal, unspecified: Secondary | ICD-10-CM | POA: Diagnosis present

## 2024-12-03 DIAGNOSIS — F10930 Alcohol use, unspecified with withdrawal, uncomplicated: Secondary | ICD-10-CM

## 2024-12-03 LAB — CBC
HCT: 42.1 % (ref 39.0–52.0)
Hemoglobin: 15.8 g/dL (ref 13.0–17.0)
MCH: 35.5 pg — ABNORMAL HIGH (ref 26.0–34.0)
MCHC: 37.5 g/dL — ABNORMAL HIGH (ref 30.0–36.0)
MCV: 94.6 fL (ref 80.0–100.0)
Platelets: 161 K/uL (ref 150–400)
RBC: 4.45 MIL/uL (ref 4.22–5.81)
RDW: 12.3 % (ref 11.5–15.5)
WBC: 4.1 K/uL (ref 4.0–10.5)
nRBC: 0 % (ref 0.0–0.2)

## 2024-12-03 LAB — COMPREHENSIVE METABOLIC PANEL WITH GFR
ALT: 46 U/L — ABNORMAL HIGH (ref 0–44)
AST: 61 U/L — ABNORMAL HIGH (ref 15–41)
Albumin: 4.6 g/dL (ref 3.5–5.0)
Alkaline Phosphatase: 97 U/L (ref 38–126)
Anion gap: 16 — ABNORMAL HIGH (ref 5–15)
BUN: 6 mg/dL (ref 6–20)
CO2: 22 mmol/L (ref 22–32)
Calcium: 9.5 mg/dL (ref 8.9–10.3)
Chloride: 99 mmol/L (ref 98–111)
Creatinine, Ser: 0.78 mg/dL (ref 0.61–1.24)
GFR, Estimated: >60 mL/min
Glucose, Bld: 111 mg/dL — ABNORMAL HIGH (ref 70–99)
Potassium: 3.8 mmol/L (ref 3.5–5.1)
Sodium: 136 mmol/L (ref 135–145)
Total Bilirubin: 0.6 mg/dL (ref 0.0–1.2)
Total Protein: 8 g/dL (ref 6.5–8.1)

## 2024-12-03 LAB — ETHANOL: Alcohol, Ethyl (B): <15 mg/dL

## 2024-12-03 MED ORDER — CHLORDIAZEPOXIDE HCL 25 MG PO CAPS
50.0000 mg | ORAL_CAPSULE | Freq: Once | ORAL | Status: AC
  Administered 2024-12-03: 50 mg via ORAL
  Filled 2024-12-03: qty 2

## 2024-12-03 MED ORDER — THIAMINE HCL 100 MG PO TABS: 100.0000 mg | ORAL_TABLET | Freq: Every morning | ORAL | 2 refills | Status: AC

## 2024-12-03 MED ORDER — THIAMINE HCL 100 MG/ML IJ SOLN
100.0000 mg | Freq: Once | INTRAMUSCULAR | Status: AC
  Administered 2024-12-03: 100 mg via INTRAVENOUS
  Filled 2024-12-03: qty 2

## 2024-12-03 MED ORDER — CHLORDIAZEPOXIDE HCL 25 MG PO CAPS: ORAL_CAPSULE | ORAL | 0 refills | Status: AC

## 2024-12-03 MED ORDER — FOLIC ACID 1 MG PO TABS: 1.0000 mg | ORAL_TABLET | Freq: Every day | ORAL | 0 refills | Status: AC

## 2024-12-03 MED ORDER — FOLIC ACID 1 MG PO TABS
1.0000 mg | ORAL_TABLET | Freq: Once | ORAL | Status: AC
  Administered 2024-12-03: 1 mg via ORAL
  Filled 2024-12-03: qty 1

## 2024-12-03 MED ORDER — SODIUM CHLORIDE 0.9 % IV BOLUS
1000.0000 mL | Freq: Once | INTRAVENOUS | Status: AC
  Administered 2024-12-03: 1000 mL via INTRAVENOUS

## 2024-12-03 MED ORDER — SODIUM CHLORIDE 0.9 % IV SOLN
1000.0000 mg | Freq: Once | INTRAVENOUS | Status: AC
  Administered 2024-12-03: 1000 mg via INTRAVENOUS
  Filled 2024-12-03: qty 7.69

## 2024-12-03 NOTE — Discharge Instructions (Addendum)
 You were seen in the emergency department for alcohol withdrawal.  You were given phenobarbital , IV fluids and thiamine .  You were given your first dose of Librium  to help prevent further alcohol withdrawal.  It is importantly you avoid any alcohol while on this medication.  Go to [facility] today to get further help for your alcohol use disorder.

## 2024-12-03 NOTE — ED Provider Notes (Signed)
 "  [facility] Provider Note    Event Date/Time   First MD Initiated Contact with Patient 12/03/24 1540     (approximate)   History   Alcohol Problem   HPI  Melvin Knight is a 44 y.o. male past medical history significant for alcohol abuse and dependence who presents to the emergency department from [facility] for hypertension.  Reports last drink of alcohol was yesterday.  Presents to the emergency department with concern for alcohol withdrawal.  States that his last drink of alcohol was approximately 24 hours ago.  Sent from [facility] for concern for alcohol withdrawal symptoms.  Denies any falls or headaches.  Denies any change in vision.  Denies any blood in his stool or melanotic stool.  Denies any new medications.  No fever or chills.  No cough or congestion or dysuria.   On chart review patient has a history of alcohol and alcohol withdraw.  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  Patient was most recently seen in the emergency department on 10/22/2024 for relapse of alcohol, was given phenobarbital  and started on a phenobarbital  taper.  Was ultimately able to discharge home.     Physical Exam   Triage Vital Signs: ED Triage Vitals  Encounter Vitals Group     BP 12/03/24 1455 (!) 178/130     Girls Systolic BP Percentile --      Girls Diastolic BP Percentile --      Boys Systolic BP Percentile --      Boys Diastolic BP Percentile --      Pulse Rate 12/03/24 1455 (!) 104     Resp 12/03/24 1455 20     Temp 12/03/24 1456 98.8 F (37.1 C)     Temp Source 12/03/24 1456 Oral     SpO2 12/03/24 1455 96 %     Weight 12/03/24 1455 215 lb (97.5 kg)     Height 12/03/24 1455 5' 7 (1.702 m)     Head Circumference --      Peak Flow --      Pain Score 12/03/24 1455 0     Pain Loc --      Pain Education --      Exclude from Growth Chart --     Most recent vital signs: Vitals:   12/03/24 1457 12/03/24 1552  BP: (!) 178/130 (!) 174/132  Pulse: (!) 104 88   Resp:    Temp:    SpO2:      Physical Exam Constitutional:      Appearance: He is well-developed.  HENT:     Head: Atraumatic.  Eyes:     Extraocular Movements: Extraocular movements intact.     Conjunctiva/sclera: Conjunctivae normal.     Pupils: Pupils are equal, round, and reactive to light.  Cardiovascular:     Rate and Rhythm: Regular rhythm. Tachycardia present.  Pulmonary:     Effort: No respiratory distress.  Abdominal:     Tenderness: There is no abdominal tenderness.  Musculoskeletal:        General: Normal range of motion.     Cervical back: Normal range of motion.     Right lower leg: No edema.     Left lower leg: No edema.  Skin:    General: Skin is warm.     Capillary Refill: Capillary refill takes less than 2 seconds.  Neurological:     Mental Status: He is alert. Mental status is at baseline.     Comments:  Cranial nerves intact.  Nonfocal neurologic exam.  Moving all extremities.  Following commands.  No hallucinations.     IMPRESSION / MDM / ASSESSMENT AND PLAN / ED COURSE  I reviewed the triage vital signs and the nursing notes.  Differential diagnosis concerning for alcohol withdrawal, anemia, dehydration, electrolyte abnormality, intoxication  Patient has a CIWA score of 10  Sinus tachycardia while on cardiac telemetry.  LABS (all labs ordered are listed, but only abnormal results are displayed) Labs interpreted as -    Labs Reviewed  COMPREHENSIVE METABOLIC PANEL WITH GFR - Abnormal; Notable for the following components:      Result Value   Glucose, Bld 111 (*)    AST 61 (*)    ALT 46 (*)    Anion gap 16 (*)    All other components within normal limits  CBC - Abnormal; Notable for the following components:   MCH 35.5 (*)    MCHC 37.5 (*)    All other components within normal limits  ETHANOL  URINE DRUG SCREEN     MDM  Clinical picture is most consistent with alcohol withdrawal.  Alcohol level is undetectable here.  Clinical  picture is not consistent with Warnicke's encephalopathy.  No significant electrolyte abnormality.  Does not have any signs or symptoms of a GI bleed.  Hypertension likely in the setting of alcohol withdrawal.  Does not have any chest pain or shortness of breath.  No signs or symptoms consistent with endorgan damage.  Ordered 10 mg/kg IV bolus of phenobarbital .  Given IV bolus and added on thiamine  and folic acid .  Plan to reevaluate his CIWA score after phenobarbital .  Clinical Course as of 12/03/24 1859  Mon Dec 03, 2024  1858 On reevaluation patient had significant improvement of tachycardia.  No longer tremulous.  CIWA score of 3.  Given first dose of Librium  and will start on Librium .  Discussed close follow-up with [facility].  Discussed return precautions for any ongoing or worsening symptoms.  Tolerating p.o. [SM]    Clinical Course User Index [SM] Suzanne Kirsch, MD     PROCEDURES:  Critical Care performed: yes  .Critical Care  Performed by: Suzanne Kirsch, MD Authorized by: Suzanne Kirsch, MD   Critical care provider statement:    Critical care time (minutes):  30   Critical care time was exclusive of:  Separately billable procedures and treating other patients   Critical care was necessary to treat or prevent imminent or life-threatening deterioration of the following conditions:  Toxidrome   Critical care was time spent personally by me on the following activities:  Development of treatment plan with patient or surrogate, discussions with consultants, evaluation of patient's response to treatment, examination of patient, ordering and review of laboratory studies, ordering and review of radiographic studies, ordering and performing treatments and interventions, pulse oximetry, re-evaluation of patient's condition and review of old charts   Patient's presentation is most consistent with acute presentation with potential threat to life or bodily function.   MEDICATIONS ORDERED IN  ED: Medications  chlordiazePOXIDE  (LIBRIUM ) capsule 50 mg (has no administration in time range)  PHENObarbital  (LUMINAL) 1,000 mg in sodium chloride  0.9 % 100 mL IVPB (0 mg Intravenous Stopped 12/03/24 1740)  sodium chloride  0.9 % bolus 1,000 mL (0 mLs Intravenous Stopped 12/03/24 1807)  thiamine  (VITAMIN B1) injection 100 mg (100 mg Intravenous Given 12/03/24 1656)  folic acid  (FOLVITE ) tablet 1 mg (1 mg Oral Given 12/03/24 1657)    FINAL CLINICAL IMPRESSION(S) /  ED DIAGNOSES   Final diagnoses:  Alcohol withdrawal syndrome without complication (HCC)     Rx / DC Orders   ED Discharge Orders          Ordered    folic acid  (FOLVITE ) 1 MG tablet  Daily        12/03/24 1721    thiamine  (VITAMIN B1) 100 MG tablet  Every morning        12/03/24 1721    chlordiazePOXIDE  (LIBRIUM ) 25 MG capsule  Multiple Frequencies        12/03/24 1856             Note:  This document was prepared using Dragon voice recognition software and may include unintentional dictation errors.   Suzanne Kirsch, MD 12/03/24 1859  "

## 2024-12-03 NOTE — ED Triage Notes (Signed)
 Pt reports daily liquor drinking (4-5 drinks) and last drank yesterday around this time. Endorses hx of seizures. Pt went to [facility] and they sent due to HTN.

## 2024-12-03 NOTE — ED Notes (Signed)
 This RN notified EDP Mumma of pt's BP before d/c. EDP Mumma approved of continued d/c

## 2025-02-18 ENCOUNTER — Ambulatory Visit: Admitting: Internal Medicine
